# Patient Record
Sex: Male | Born: 1955
Health system: Southern US, Community
[De-identification: ages and names within clinical notes are randomized; demographics above are authoritative.]

## PROBLEM LIST (undated history)

## (undated) DIAGNOSIS — R05 Cough: Secondary | ICD-10-CM

## (undated) DIAGNOSIS — R55 Syncope and collapse: Secondary | ICD-10-CM

## (undated) DIAGNOSIS — K219 Gastro-esophageal reflux disease without esophagitis: Secondary | ICD-10-CM

## (undated) DIAGNOSIS — I10 Essential (primary) hypertension: Secondary | ICD-10-CM

## (undated) DIAGNOSIS — M797 Fibromyalgia: Secondary | ICD-10-CM

## (undated) DIAGNOSIS — E785 Hyperlipidemia, unspecified: Secondary | ICD-10-CM

## (undated) DIAGNOSIS — IMO0002 Reserved for concepts with insufficient information to code with codable children: Secondary | ICD-10-CM

## (undated) DIAGNOSIS — J189 Pneumonia, unspecified organism: Secondary | ICD-10-CM

## (undated) DIAGNOSIS — D649 Anemia, unspecified: Secondary | ICD-10-CM

## (undated) DIAGNOSIS — R054 Cough syncope: Secondary | ICD-10-CM

## (undated) DIAGNOSIS — R0602 Shortness of breath: Secondary | ICD-10-CM

## (undated) DIAGNOSIS — E538 Deficiency of other specified B group vitamins: Secondary | ICD-10-CM

## (undated) DIAGNOSIS — G4730 Sleep apnea, unspecified: Secondary | ICD-10-CM

## (undated) DIAGNOSIS — Z87442 Personal history of urinary calculi: Secondary | ICD-10-CM

## (undated) DIAGNOSIS — E118 Type 2 diabetes mellitus with unspecified complications: Secondary | ICD-10-CM

## (undated) DIAGNOSIS — E1165 Type 2 diabetes mellitus with hyperglycemia: Secondary | ICD-10-CM

## (undated) DIAGNOSIS — E119 Type 2 diabetes mellitus without complications: Secondary | ICD-10-CM

## (undated) DIAGNOSIS — I2510 Atherosclerotic heart disease of native coronary artery without angina pectoris: Secondary | ICD-10-CM

## (undated) HISTORY — DX: Type 2 diabetes mellitus with hyperglycemia: E11.65

## (undated) HISTORY — PX: SHOULDER ARTHROSCOPY: SHX128

## (undated) HISTORY — DX: Essential (primary) hypertension: I10

## (undated) HISTORY — PX: ROUX-EN-Y GASTRIC BYPASS: SHX1104

## (undated) HISTORY — DX: Shortness of breath: R06.02

## (undated) HISTORY — DX: Type 2 diabetes mellitus with unspecified complications: E11.8

## (undated) HISTORY — DX: Hyperlipidemia, unspecified: E78.5

## (undated) HISTORY — PX: HERNIA REPAIR: SHX51

## (undated) HISTORY — DX: Deficiency of other specified B group vitamins: E53.8

## (undated) HISTORY — PX: ELBOW SURGERY: SHX618

## (undated) HISTORY — DX: Atherosclerotic heart disease of native coronary artery without angina pectoris: I25.10

## (undated) HISTORY — DX: Reserved for concepts with insufficient information to code with codable children: IMO0002

## (undated) HISTORY — DX: Morbid (severe) obesity due to excess calories: E66.01

---

## 2002-09-25 ENCOUNTER — Encounter: Payer: Self-pay | Admitting: Urology

## 2002-09-25 ENCOUNTER — Encounter: Payer: Self-pay | Admitting: Emergency Medicine

## 2002-09-25 ENCOUNTER — Emergency Department (HOSPITAL_COMMUNITY): Admission: EM | Admit: 2002-09-25 | Discharge: 2002-09-25 | Payer: Self-pay | Admitting: Emergency Medicine

## 2002-09-25 ENCOUNTER — Ambulatory Visit (HOSPITAL_COMMUNITY): Admission: RE | Admit: 2002-09-25 | Discharge: 2002-09-25 | Payer: Self-pay | Admitting: Urology

## 2003-12-03 ENCOUNTER — Encounter: Admission: RE | Admit: 2003-12-03 | Discharge: 2003-12-03 | Payer: Self-pay | Admitting: Family Medicine

## 2005-01-30 ENCOUNTER — Encounter: Admission: RE | Admit: 2005-01-30 | Discharge: 2005-01-30 | Payer: Self-pay | Admitting: Family Medicine

## 2005-03-27 ENCOUNTER — Ambulatory Visit (HOSPITAL_COMMUNITY): Admission: RE | Admit: 2005-03-27 | Discharge: 2005-03-27 | Payer: Self-pay | Admitting: Cardiology

## 2005-08-04 ENCOUNTER — Encounter: Admission: RE | Admit: 2005-08-04 | Discharge: 2005-08-04 | Payer: Self-pay | Admitting: Surgery

## 2005-08-05 ENCOUNTER — Ambulatory Visit (HOSPITAL_BASED_OUTPATIENT_CLINIC_OR_DEPARTMENT_OTHER): Admission: RE | Admit: 2005-08-05 | Discharge: 2005-08-05 | Payer: Self-pay | Admitting: Surgery

## 2005-09-22 ENCOUNTER — Encounter: Admission: RE | Admit: 2005-09-22 | Discharge: 2005-09-22 | Payer: Self-pay | Admitting: Family Medicine

## 2005-09-30 ENCOUNTER — Ambulatory Visit: Payer: Self-pay | Admitting: Pulmonary Disease

## 2005-10-15 ENCOUNTER — Encounter: Admission: RE | Admit: 2005-10-15 | Discharge: 2005-10-15 | Payer: Self-pay | Admitting: Family Medicine

## 2005-10-30 ENCOUNTER — Encounter: Admission: RE | Admit: 2005-10-30 | Discharge: 2005-10-30 | Payer: Self-pay | Admitting: *Deleted

## 2005-10-31 ENCOUNTER — Encounter: Admission: RE | Admit: 2005-10-31 | Discharge: 2005-10-31 | Payer: Self-pay | Admitting: *Deleted

## 2005-11-05 ENCOUNTER — Encounter: Admission: RE | Admit: 2005-11-05 | Discharge: 2005-11-05 | Payer: Self-pay | Admitting: Gastroenterology

## 2006-11-03 ENCOUNTER — Ambulatory Visit: Payer: Self-pay | Admitting: Gastroenterology

## 2006-11-10 ENCOUNTER — Ambulatory Visit: Payer: Self-pay | Admitting: Gastroenterology

## 2007-11-30 ENCOUNTER — Ambulatory Visit (HOSPITAL_COMMUNITY): Admission: RE | Admit: 2007-11-30 | Discharge: 2007-11-30 | Payer: Self-pay

## 2007-11-30 ENCOUNTER — Ambulatory Visit: Payer: Self-pay | Admitting: Surgery

## 2009-07-31 DIAGNOSIS — G3184 Mild cognitive impairment, so stated: Secondary | ICD-10-CM | POA: Insufficient documentation

## 2009-08-07 DIAGNOSIS — R5381 Other malaise: Secondary | ICD-10-CM

## 2009-08-07 DIAGNOSIS — R61 Generalized hyperhidrosis: Secondary | ICD-10-CM

## 2009-08-07 DIAGNOSIS — Z87442 Personal history of urinary calculi: Secondary | ICD-10-CM

## 2009-08-07 DIAGNOSIS — E782 Mixed hyperlipidemia: Secondary | ICD-10-CM | POA: Insufficient documentation

## 2009-08-07 DIAGNOSIS — I10 Essential (primary) hypertension: Secondary | ICD-10-CM | POA: Insufficient documentation

## 2009-08-07 DIAGNOSIS — E785 Hyperlipidemia, unspecified: Secondary | ICD-10-CM

## 2009-08-07 DIAGNOSIS — Z87898 Personal history of other specified conditions: Secondary | ICD-10-CM

## 2009-08-07 DIAGNOSIS — K5730 Diverticulosis of large intestine without perforation or abscess without bleeding: Secondary | ICD-10-CM | POA: Insufficient documentation

## 2009-08-07 DIAGNOSIS — G4733 Obstructive sleep apnea (adult) (pediatric): Secondary | ICD-10-CM

## 2009-08-07 DIAGNOSIS — R5383 Other fatigue: Secondary | ICD-10-CM

## 2009-08-12 ENCOUNTER — Ambulatory Visit: Payer: Self-pay | Admitting: Cardiovascular Disease

## 2009-08-12 DIAGNOSIS — R079 Chest pain, unspecified: Secondary | ICD-10-CM

## 2009-08-12 DIAGNOSIS — R42 Dizziness and giddiness: Secondary | ICD-10-CM | POA: Insufficient documentation

## 2009-08-12 DIAGNOSIS — I2510 Atherosclerotic heart disease of native coronary artery without angina pectoris: Secondary | ICD-10-CM | POA: Insufficient documentation

## 2009-08-16 ENCOUNTER — Telehealth: Payer: Self-pay | Admitting: Cardiovascular Disease

## 2009-08-20 ENCOUNTER — Telehealth (INDEPENDENT_AMBULATORY_CARE_PROVIDER_SITE_OTHER): Payer: Self-pay | Admitting: *Deleted

## 2009-08-21 ENCOUNTER — Ambulatory Visit (HOSPITAL_COMMUNITY): Admission: RE | Admit: 2009-08-21 | Discharge: 2009-08-21 | Payer: Self-pay | Admitting: Cardiovascular Disease

## 2009-08-21 ENCOUNTER — Ambulatory Visit: Payer: Self-pay

## 2009-08-21 ENCOUNTER — Encounter (HOSPITAL_COMMUNITY): Admission: RE | Admit: 2009-08-21 | Discharge: 2009-10-22 | Payer: Self-pay | Admitting: Cardiovascular Disease

## 2009-08-21 ENCOUNTER — Ambulatory Visit: Payer: Self-pay | Admitting: Cardiovascular Disease

## 2009-08-21 ENCOUNTER — Encounter: Payer: Self-pay | Admitting: Cardiovascular Disease

## 2009-08-29 ENCOUNTER — Encounter: Payer: Self-pay | Admitting: Cardiology

## 2009-09-02 ENCOUNTER — Ambulatory Visit: Payer: Self-pay | Admitting: Cardiovascular Disease

## 2009-09-04 ENCOUNTER — Ambulatory Visit: Payer: Self-pay | Admitting: Cardiovascular Disease

## 2009-09-04 ENCOUNTER — Inpatient Hospital Stay (HOSPITAL_BASED_OUTPATIENT_CLINIC_OR_DEPARTMENT_OTHER): Admission: RE | Admit: 2009-09-04 | Discharge: 2009-09-04 | Payer: Self-pay | Admitting: Cardiovascular Disease

## 2009-09-19 ENCOUNTER — Telehealth: Payer: Self-pay | Admitting: Cardiovascular Disease

## 2009-09-23 ENCOUNTER — Telehealth (INDEPENDENT_AMBULATORY_CARE_PROVIDER_SITE_OTHER): Payer: Self-pay | Admitting: *Deleted

## 2009-10-01 ENCOUNTER — Ambulatory Visit: Payer: Self-pay | Admitting: Cardiovascular Disease

## 2009-11-20 DIAGNOSIS — M064 Inflammatory polyarthropathy: Secondary | ICD-10-CM | POA: Insufficient documentation

## 2010-03-02 ENCOUNTER — Encounter: Admission: RE | Admit: 2010-03-02 | Discharge: 2010-03-02 | Payer: Self-pay | Admitting: *Deleted

## 2010-07-13 ENCOUNTER — Encounter: Payer: Self-pay | Admitting: *Deleted

## 2010-07-20 LAB — CONVERTED CEMR LAB
Basophils Absolute: 0 10*3/uL (ref 0.0–0.1)
Basophils Relative: 0.5 % (ref 0.0–3.0)
Calcium: 9.1 mg/dL (ref 8.4–10.5)
Chloride: 111 meq/L (ref 96–112)
Creatinine, Ser: 0.9 mg/dL (ref 0.4–1.5)
GFR calc non Af Amer: 92.98 mL/min (ref >60)
Glucose, Bld: 104 mg/dL — ABNORMAL HIGH (ref 70–99)
Hemoglobin: 16.3 g/dL (ref 13.0–17.0)
INR: 1.1 — ABNORMAL HIGH (ref 0.8–1.0)
Lymphocytes Relative: 22.8 % (ref 12.0–46.0)
Lymphs Abs: 2 10*3/uL (ref 0.7–4.0)
MCHC: 33.6 g/dL (ref 30.0–36.0)
MCV: 86.6 fL (ref 78.0–100.0)
Neutro Abs: 5.6 10*3/uL (ref 1.4–7.7)
Neutrophils Relative %: 63.6 % (ref 43.0–77.0)
RBC: 5.61 M/uL (ref 4.22–5.81)
Sodium: 143 meq/L (ref 135–145)
aPTT: 29.3 s — ABNORMAL HIGH (ref 21.7–28.8)

## 2010-07-22 NOTE — Progress Notes (Signed)
  Phone Note Other Incoming   Request: Send information Summary of Call: Request received from [employer] forwarded to [employer].       

## 2010-07-22 NOTE — Progress Notes (Signed)
Summary: holtor monitor  Phone Note Outgoing Call   Summary of Call: Called pt to give him results of holtor monitor (see 09/19/09 append to 09/02/09 office note). Left message to call back Initial call taken by: Dossie Arbour, RN, BSN,  September 19, 2009 5:56 PM  Follow-up for Phone Call        Dr. Clifton James reviewed with pt at office visit 10/01/09 Follow-up by: Dossie Arbour, RN, BSN,  October 03, 2009 8:40 AM

## 2010-07-22 NOTE — Assessment & Plan Note (Signed)
Summary: per check out/sf   Visit Type:  Follow-up Primary Provider:  Herb Grays, MD  CC:  sob due to bronchitis/pneumonia.  History of Present Illness: 55 yo WM with history moderate non-obstrucitve CAD, HTN, hyperlipidemia, DM, GERD, anxiety, depression here today for follow up. He has been having "spells" for one year. These spells occur when he is sitting, standing or walking. This is described as being exhausted. There is occasionally associated chest pressure and arm pain.  When he has these spells, he checks his HR and blood pressure and blood sugar all of which have been within normal limits. He had one syncopal episode in December 2010 related to coughing.  I ordered a stress test, echo, carotid dopplers and a 21 day event monitor. His carotid dopplers were normal. His event monitor showed no arrythmias. Stress test was without ischemia but given his symptom complex and history of CAD, I elected to perfrm a diagnostic left heart cath. The left heart cath showed moderate disease as outline below but no severe lesions.   He is here today for follow up. He tells me that he has bronchitis and pneumonia for one week. He has been seen by Dr. Herb Grays and has been on antibiotics. His breathing has improved but he is still coughing. No fevers or chills. He has been taking all of his cardiac medications.   He continues to have episodes of profound weakness with sweating. No chest pain. He had a thyroid test in [facility] but he has not heard the results. He has had no weight loss lately and noticed no swollen lymph nodes.   Current Medications (verified): 1)  Amlodipine Besylate 10 Mg Tabs (Amlodipine Besylate) .Marland Kitchen.. 1 Tab Once Daily 2)  Crestor 20 Mg Tabs (Rosuvastatin Calcium) .Marland Kitchen.. 1 Tab Once Daily 3)  Lisinopril 10 Mg Tabs (Lisinopril) .... 1/2 Tab Once Daily 4)  Protonix 40 Mg Tbec (Pantoprazole Sodium) .Marland Kitchen.. 1 Tab Two Times A Day 5)  Metformin Hcl 500 Mg Tabs (Metformin Hcl)  .... 2 Tab Two Times A Day 6)  Metoprolol Tartrate 100 Mg Tabs (Metoprolol Tartrate) .Marland Kitchen.. 1 Tab Once Daily 7)  Sertraline Hcl 100 Mg Tabs (Sertraline Hcl) .Marland Kitchen.. 1 Tab Once Daily 8)  Requip 1 Mg Tabs (Ropinirole Hcl) .Marland Kitchen.. 1 Tab Once Daily 9)  Glimepiride 1 Mg Tabs (Glimepiride) .Marland Kitchen.. 1 Tab Once Daily 10)  Aspirin Ec 325 Mg Tbec (Aspirin) .... Take One Tablet By Mouth Daily 11)  Diphenhydramine Hcl 25 Mg Caps (Diphenhydramine Hcl) .Marland Kitchen.. 1 Tab At Bedtime 12)  Advil 200 Mg Tabs (Ibuprofen) .Marland Kitchen.. 1 Tab As Needed 13)  Doxycycline Hyclate 100 Mg Caps (Doxycycline Hyclate) .Marland Kitchen.. 1 Tab By Mouth Two Times A Day 14)  Mucinex Dm Maximum Strength 60-1200 Mg Xr12h-Tab (Dextromethorphan-Guaifenesin) .... As Needed  Allergies: 1)  ! Pcn  Past History:  Past Medical History: HYPERTENSION (ICD-401.9) HYPERLIPIDEMIA (ICD-272.4) DM Non-obstrucitve CAD by cath 2011, 50% mid LAD. HEADACHES, HX OF (ICD-V13.8) Nephrolithiasis DIVERTICULOSIS, COLON (ICD-562.10) GERD Anxiety  Depression    Social History: Reviewed history from 08/12/2009 and no changes required. Former tobacco, 1ppd for 10 years, quit 1980. Chews 1-2 packs of tobacco per day No alcohol No recreational drug use Married, 2 children Former Producer, television/film/video  Review of Systems       The patient complains of fatigue, shortness of breath, and prolonged cough.  The patient denies malaise, fever, weight gain/loss, vision loss, decreased hearing, hoarseness, chest pain, palpitations, wheezing, sleep apnea, coughing up blood,  abdominal pain, blood in stool, nausea, vomiting, diarrhea, heartburn, incontinence, blood in urine, muscle weakness, joint pain, leg swelling, rash, skin lesions, headache, fainting, dizziness, depression, anxiety, enlarged lymph nodes, easy bruising or bleeding, and environmental allergies.    Vital Signs:  Patient profile:   55 year old male Height:      70 inches Weight:      263 pounds BMI:     37.87 O2 Sat:       96 % Pulse rate:   72 / minute Resp:     16 per minute BP sitting:   140 / 98  (left arm)  Vitals Entered By: Kem Parkinson (October 01, 2009 4:26 PM)  Physical Exam  General:  General: Well developed, well nourished, NAD HEENT: OP clear, mucus membranes moist SKIN: moist, warm Neuro: No focal deficits Musculoskeletal: Muscle strength 5/5 all ext Psychiatric: Mood and affect normal Neck: No JVD, no carotid bruits, no thyromegaly, no lymphadenopathy. Lungs:Clear bilaterally, no wheezes, rhonci, crackles CV: RRR no murmurs, gallops rubs Abdomen: soft, NT, ND, BS present Extremities: No edema, pulses 2+.    Cardiac Cath  Procedure date:  09/04/2009  Findings:      HEMODYNAMIC FINDINGS:  Central aortic pressure 105/66.  Left ventricular pressure 107/15.  Left ventricular end-diastolic pressure 17.   ANGIOGRAPHIC FINDINGS: 1. The left main coronary artery had no evidence of disease. 2. Left anterior descending is a large vessel that coursed to the apex     and gives off 2 diagonal branches.  The first diagonal branch is     moderate size with mild plaque disease.  Second diagonal is small     with no disease.  The proximal LAD has several areas of mild 20%     stenosis.  Just beyond the takeoff of the first diagonal branch,     there appears to be a 405 to 50% tubular stenosis in the mid LAD.     This does not appear to be flow-limiting.  There is diffuse 20%     stenosis in the distal vessel. 3. The circumflex artery is a small-caliber vessel with no disease. 4. The ramus intermediate is a moderate-to-large sized vessel with 30%     proximal stenosis and 30% mid plaque. 5. The right coronary artery is a large dominant vessel with a long     tubular 40% stenosis in the proximal portion.  There are luminal     irregularities only in the mid and distal vessel.  There are no     flow-limiting lesions in this vessel. 6. The left ventricular angiogram was performed in the  RAO projection.     Left ventricular systolic function was normal with no wall motion     abnormality noted.  Ejection fraction was 60%.   IMPRESSION: 1. Moderate nonobstructive coronary artery disease. 2. Normal left ventricular systolic function.  Impression & Recommendations:  Problem # 1:  CAD, NATIVE VESSEL (ICD-414.01) Stable by cath 09/04/09. Continue ASA, beta blocker, statin, Ace-inhibitor. I do not think his episodes of fatigue and weakness are cardiac related. I am concerned that this may be due to his thyroid or other endocrine abnormality. He will call the neurology office to get his TSH results and let us know.   His updated medication list for this problem includes:    Amlodipine Besylate 10 Mg Tabs (Amlodipine besylate) .Marland Kitchen... 1 tab once daily    Lisinopril 10 Mg Tabs (Lisinopril) .Marland Kitchen... 1/2 tab once daily  Metoprolol Tartrate 100 Mg Tabs (Metoprolol tartrate) .Marland Kitchen... 1 tab once daily    Aspirin Ec 325 Mg Tbec (Aspirin) .Marland Kitchen... Take one tablet by mouth daily  Problem # 2:  WEAKNESS (ICD-780.79) See above.   Problem # 3:  HYPERTENSION (ICD-401.9) Slightly elevated today. No changes in therapy.   His updated medication list for this problem includes:    Amlodipine Besylate 10 Mg Tabs (Amlodipine besylate) .Marland Kitchen... 1 tab once daily    Lisinopril 10 Mg Tabs (Lisinopril) .Marland Kitchen... 1/2 tab once daily    Metoprolol Tartrate 100 Mg Tabs (Metoprolol tartrate) .Marland Kitchen... 1 tab once daily    Aspirin Ec 325 Mg Tbec (Aspirin) .Marland Kitchen... Take one tablet by mouth daily  Problem # 4:  HYPERLIPIDEMIA (ICD-272.4) Continue statin. Fasting lipids followed in primary care.  Goal LDL <70.   His updated medication list for this problem includes:    Crestor 20 Mg Tabs (Rosuvastatin calcium) .Marland Kitchen... 1 tab once daily  Patient Instructions: 1)  Your physician recommends that you schedule a follow-up appointment in: 6 months 2)  Your physician recommends that you continue on your current medications as directed.  Please refer to the Current Medication list given to you today.

## 2010-07-22 NOTE — Letter (Signed)
Summary: Cardiac Catheterization Instructions- [facility]  [facility], Main Office  [address]   [place], [place] [address]   Phone: [phone]  Fax: [phone]     09/02/2009 MRN: [id number]  ESAIAS CLEAVENGER [address] [place], [place]  [address]  Dear Mr. Throckmorton,   You are scheduled for a Cardiac Catheterization on March 16. 2011_ with Dr.McAlhany_  Please arrive to the 1st floor of the Heart and Vascular Center at [facility] at 10:30 am  on the day of your procedure. Please do not arrive before 6:30 a.m. Call the Heart and Vascular Center at [phone] if you are unable to make your appointmnet. The Code to get into the parking garage under the building is 9000_. Take the elevators to the 1st floor. You must have someone to drive you home. Someone must be with you for the first 24 hours after you arrive home. Please wear clothes that are easy to get on and off and wear slip-on shoes. Do not eat or drink after midnight except water with your medications that morning. Bring all your medications and current insurance cards with you.  _X__ DO NOT take these medications before your procedure: Do not take metformin evening before and morning of procedure and for 48 hours after procedure. Do not take glimepiride the morning of procedure. ________________________________________________________________  _X__ Make sure you take your aspirin.  ___ You may take ALL of your medications with water that morning. ________________________________________________________________________________________________________________________________  ___ DO NOT take ANY medications before your procedure.  ___ Pre-med instructions:  ________________________________________________________________________________________________________________________________  The usual length of stay after your procedure is 2 to 3 hours. This can vary.  If you have any questions, please  call the office at the number listed above.   Dossie Arbour, RN, BSN

## 2010-07-22 NOTE — Progress Notes (Signed)
Summary: Nuclear Pre-Procedure  Phone Note Outgoing Call   Call placed by: Milana Na, EMT-P,  August 20, 2009 3:42 PM Summary of Call: Left message with information on Myoview Information Sheet (see scanned document for details).      Nuclear Med Background Indications for Stress Test: Evaluation for Ischemia   History: Heart Catheterization  History Comments: '06 Heart Cath N/O CAD LAD 50%  Symptoms: Chest Pressure, Dizziness, Fatigue, Fatigue with Exertion, Syncope    Nuclear Pre-Procedure Cardiac Risk Factors: History of Smoking, Hypertension, Lipids Height (in): 70  Nuclear Med Study Referring MD:  C.McAlhany

## 2010-07-22 NOTE — Progress Notes (Signed)
Summary: benefit not covered  Phone Note From Other Clinic Call back at [phone]   Caller: Jeanice Lim with [facility] Request: Talk with Nurse, Talk with Provider Summary of Call: code [phone] this was reviewed and it's not a covered benefit Initial call taken by: Omer Jack,  August 16, 2009 10:20 AM  Follow-up for Phone Call        Unable to leave a message mail box full. Ollen Gross, RN, BSN  August 16, 2009 10:35 AM Called and was on hold for 7 minutes. Attempted to leave voice mail but mailbox was full.  Will try again later. Dossie Arbour, RN, BSN  August 19, 2009 10:51 AM Called and was on hold for 5 minutes.  Mailbox still full. Will continue to try and reach. Dossie Arbour, RN, BSN  August 19, 2009 4:21 PM Discussed with Elita Quick (in billing) and Windell Moulding. Windell Moulding will follow up and explain coverage to pt when he is here to have monitor placed on 08/21/09 Follow-up by: Dossie Arbour, RN, BSN,  August 20, 2009 8:42 AM

## 2010-07-22 NOTE — Assessment & Plan Note (Signed)
Summary: f/u echo , carotid, myoview 08-21-09   Visit Type:  Follow-up Primary Provider:  Dewain Penning  CC:  sob...cp x2 in last 2 wks..denies any edema.  History of Present Illness: 55 yo Williams with history non-obstrucitve CAD by cath 2006, HTN, hyperlipidemia, DM, GERD, anxiety, depression here today for follow up. He has been having "spells" for one year. I saw him as a new patient several weeks ago. These spells occur when he is sitting, standing or walking. This is described as being exhausted. No associated but there is occasionally associated chest pressure and arm pain. This occurs several times per week. There is no preceding symptom to warn him. No associated palpitations. He has good days where he can accomplish any task. He cut wood last weekend without problems.  When he has these spells, he checks his HR and blood pressure and blood sugar all of which have been within normal limits. He had one sycnopal episode in December 2010 related to coughing. He has recently seen a neurologist who did not feel like this was a neurological event. I ordered a stress test, echo, carotid dopplers and a 21 day event monitor.   He tells me that he has had several episodes of chest pain over the last few days. This is left sided with radiation into his left arm and associated nausea with diaphoresis, weakness. He has felt "awful"  during these episodes. His energy level has been severely reduced since I saw him a few weeks ago. He was told that he had a 50-60% stenosis by cath in 2006 in the LAD.   Current Medications (verified): 1)  Amlodipine Besylate 10 Mg Tabs (Amlodipine Besylate) .Marland Kitchen.. 1 Tab Once Daily 2)  Crestor 20 Mg Tabs (Rosuvastatin Calcium) .Marland Kitchen.. 1 Tab Once Daily 3)  Lisinopril 10 Mg Tabs (Lisinopril) .... 1/2 Tab Once Daily 4)  Protonix 40 Mg Tbec (Pantoprazole Sodium) .Marland Kitchen.. 1 Tab Two Times A Day 5)  Metformin Hcl 500 Mg Tabs (Metformin Hcl) .... 2 Tab Two Times A Day 6)  Metoprolol Tartrate 100  Mg Tabs (Metoprolol Tartrate) .Marland Kitchen.. 1 Tab Once Daily 7)  Sertraline Hcl 100 Mg Tabs (Sertraline Hcl) .Marland Kitchen.. 1 Tab Once Daily 8)  Requip 1 Mg Tabs (Ropinirole Hcl) .Marland Kitchen.. 1 Tab Once Daily 9)  Glimepiride 1 Mg Tabs (Glimepiride) .Marland Kitchen.. 1 Tab Once Daily 10)  Aspirin Ec 325 Mg Tbec (Aspirin) .... Take One Tablet By Mouth Daily 11)  Diphenhydramine Hcl 25 Mg Caps (Diphenhydramine Hcl) .Marland Kitchen.. 1 Tab At Bedtime 12)  Advil 200 Mg Tabs (Ibuprofen) .Marland Kitchen.. 1 Tab As Needed  Allergies: 1)  ! Pcn  Past History:  Past Medical History: Reviewed history from 08/12/2009 and no changes required. HYPERTENSION (ICD-401.9) HYPERLIPIDEMIA (ICD-272.4) DM Non-obstrucitve CAD by cath 2006, 40-50% mid LAD. HEADACHES, HX OF (ICD-V13.8) Nephrolithiasis DIVERTICULOSIS, COLON (ICD-562.10) GERD Anxiety  Depression    Past Surgical History: Reviewed history from 08/12/2009 and no changes required. Status post hernia repair in 2007 left, right hernia 1991 Status post kidney stone extraction in 2004.  Status post right elbow and right shoulder surgery in 1998.      Family History: Reviewed history from 08/12/2009 and no changes required. Mother alive at age 23, healthy Father deceased at age 36, lung cancer Sister alive  Social History: Reviewed history from 08/12/2009 and no changes required. Former tobacco, 1ppd for 10 years, quit 1980. 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  The patient denies fever, weight gain/loss, vision loss, decreased hearing, hoarseness, palpitations, prolonged cough, wheezing, sleep apnea, coughing up blood, abdominal pain, blood in stool, nausea, vomiting, diarrhea, heartburn, incontinence, blood in urine, muscle weakness, joint pain, leg swelling, rash, skin lesions, headache, fainting,  depression, anxiety, enlarged lymph nodes, easy bruising or bleeding, and environmental allergies.    Vital Signs:  Patient profile:   55 year old male Height:      70 inches Weight:      263 pounds BMI:     37.87 Pulse rate:   56 / minute Pulse rhythm:   irregular BP sitting:   128 / 90  (left arm) Cuff size:   large  Vitals Entered By: Danielle Rankin, CMA (September 02, 2009 11:36 AM)  Physical Exam  General:  General: Well developed, well nourished, NAD HEENT: OP clear, mucus membranes moist SKIN: warm, dry Neuro: No focal deficits Musculoskeletal: Muscle strength 5/5 all ext Psychiatric: Mood and affect normal Neck: No JVD, no carotid bruits, no thyromegaly, no lymphadenopathy. Lungs:Clear bilaterally, no wheezes, rhonci, crackles CV: RRR no murmurs, gallops rubs Abdomen: soft, NT, ND, BS present Extremities: No edema, pulses 2+.    EKG  Procedure date:  09/02/2009  Findings:      Sinus bradycardia, rate 56 bpm. Poor R wave progression through precordial leads.   Nuclear Study  Procedure date:  08/21/2009  Findings:      Rest Procedure   Myocardial perfusion imaging was performed at rest 45 minutes following the intravenous administration of Myoview Technetium 29m Tetrofosmin.  Stress Procedure   The patient received IV Lexiscan 0.4 mg over 15-seconds.  Myoview injected at 30-seconds.  There were no significant changes with infusion.  Quantitative spect images were obtained after a 45 minute delay.  QPS  Raw Data Images:  Normal; no motion artifact; normal heart/lung ratio. Stress Images:  NI: Uniform and normal uptake of tracer in all myocardial segments. Rest Images:  Normal homogeneous uptake in all areas of the myocardium. Subtraction (SDS):  Normal Transient Ischemic Dilatation:  .91  (Normal <1.22)  Lung/Heart Ratio:  .30  (Normal <0.45)  Quantitative Gated Spect Images  QGS EDV:  128 ml QGS ESV:  Grant ml QGS EF:  62 % QGS cine images:  normal  Findings   Normal nuclear study   Overall Impression   Exercise Capacity: Lexiscan BP Response: Normal blood pressure response. Clinical Symptoms: Dyspnea ECG Impression: No significant ST segment change suggestive of ischemia. Overall Impression: Normal stress nuclear study. Overall Impression Comments: Normal  Echocardiogram  Procedure date:  08/21/2009  Findings:      Left ventricle: Unable to assess RWMAs due to poor windows. Possible     septal hypokinesis. Overall EF probably in the normal range The     cavity size was normal. Wall thickness was increased in a pattern of     moderate LVH. Systolic function was normal. Wall motion was normal;     there were no regional wall motion abnormalities.  Carotid Doppler  Procedure date:  08/21/2009  Findings:      No obstructive disease.   Impression & Recommendations:  Problem # 1:  CAD, NATIVE VESSEL (ICD-414.01) Symptoms worrisome for unstable angina. His nuclear stress showed no big defects but with his symptom complex, will proceed to diagnostic left heart cath with possible PCI.  I cannot otherwise explain his fatigue and chest pain. He has not yet completed the event monitor. Will arrange cath for Wednesday March 16th at [facility]. Check BMET, CBC and coags today.   His updated medication list for this problem includes:    Amlodipine Besylate 10 Mg Tabs (Amlodipine besylate) .Marland Kitchen... 1 tab once daily    Lisinopril 10 Mg Tabs (Lisinopril) .Marland Kitchen... 1/2 tab once daily    Metoprolol Tartrate 100 Mg Tabs (Metoprolol tartrate) .Marland Kitchen... 1 tab once daily    Aspirin Ec 325 Mg Tbec (Aspirin) .Marland Kitchen... Take one tablet by mouth daily  Orders: Cardiac Catheterization (Cardiac Cath) TLB-BMP (Basic Metabolic Panel-BMET) (80048-METABOL) TLB-CBC Platelet - w/Differential (85025-CBCD) TLB-PT (Protime) (85610-PTP) TLB-PTT (85730-PTTL)  Patient Instructions: 1)  Your physician recommends that you schedule a follow-up appointment in: 1 month 2)  Your  physician has requested that you have a cardiac catheterization.  Cardiac catheterization is used to diagnose and/or treat various heart conditions. Doctors may recommend this procedure for a number of different reasons. The most common reason is to evaluate chest pain. Chest pain can be a symptom of coronary artery disease (CAD), and cardiac catheterization can show whether plaque is narrowing or blocking your heart's arteries. This procedure is also used to evaluate the valves, as well as measure the blood flow and oxygen levels in different parts of your heart.  For further information please visit [email or web address]  Please follow instruction sheet, as given.  Appended Document: f/u echo , carotid, myoview 08-21-09 21 day event monitor reviewed. Sinus rhythm with no evidence of VT, SVT or atrial fibrillation. cdm

## 2010-07-22 NOTE — Assessment & Plan Note (Signed)
Summary: np6/weakness sweating.memory  Medications Added AMLODIPINE BESYLATE 10 MG TABS (AMLODIPINE BESYLATE) 1 tab once daily CRESTOR 20 MG TABS (ROSUVASTATIN CALCIUM) 1 tab once daily LISINOPRIL 10 MG TABS (LISINOPRIL) 1/2 tab once daily PROTONIX 40 MG TBEC (PANTOPRAZOLE SODIUM) 1 tab two times a day METFORMIN HCL 500 MG TABS (METFORMIN HCL) 2 tab two times a day METOPROLOL TARTRATE 100 MG TABS (METOPROLOL TARTRATE) 1 tab once daily SERTRALINE HCL 100 MG TABS (SERTRALINE HCL) 1 tab once daily REQUIP 1 MG TABS (ROPINIROLE HCL) 1 tab once daily GLIMEPIRIDE 1 MG TABS (GLIMEPIRIDE) 1 tab once daily ASPIRIN EC 325 MG TBEC (ASPIRIN) Take one tablet by mouth daily DIPHENHYDRAMINE HCL 25 MG CAPS (DIPHENHYDRAMINE HCL) 1 tab at bedtime ADVIL 200 MG TABS (IBUPROFEN) 1 tab as needed      Allergies Added: ! PCN  Visit Type:  new pt visit Primary Provider:  Dewain Penning  CC:  weakness, diaphoresis, fatigue, chest pressure, and brain fog.  History of Present Illness: 55 yo WM with history non-obstrucitve CAD by cath 2006, HTN, hyperlipidemia, DM, GERD, anxiety, depression here today to establish cardiology care. He has been having "spells" for one year. These spells occur when he is sitting, standing or walking. This is described as being exhausted. No associated but there is occasionally associated chest pressure and arm pain. This occurs several times per week. There is no preceding symptom to warn him. No associated palpitations. He has good days where he can accomplish any task. He cut wood last weekend without problems. He has no exertional dyspnea or chest pain. When he has these spells, he checks his HR and blood pressure and blood sugar all of which have been within normal limits. He had one sycnopal episode in December 2010 related to coughing. He has recently seen a neurologist who did not feel like this was a neurological event. TSH was checked there but he has not heard the results.   Old  records reviewed.  Current Medications (verified): 1)  Amlodipine Besylate 10 Mg Tabs (Amlodipine Besylate) .Marland Kitchen.. 1 Tab Once Daily 2)  Crestor 20 Mg Tabs (Rosuvastatin Calcium) .Marland Kitchen.. 1 Tab Once Daily 3)  Lisinopril 10 Mg Tabs (Lisinopril) .... 1/2 Tab Once Daily 4)  Protonix 40 Mg Tbec (Pantoprazole Sodium) .Marland Kitchen.. 1 Tab Two Times A Day 5)  Metformin Hcl 500 Mg Tabs (Metformin Hcl) .... 2 Tab Two Times A Day 6)  Metoprolol Tartrate 100 Mg Tabs (Metoprolol Tartrate) .Marland Kitchen.. 1 Tab Once Daily 7)  Sertraline Hcl 100 Mg Tabs (Sertraline Hcl) .Marland Kitchen.. 1 Tab Once Daily 8)  Requip 1 Mg Tabs (Ropinirole Hcl) .Marland Kitchen.. 1 Tab Once Daily 9)  Glimepiride 1 Mg Tabs (Glimepiride) .Marland Kitchen.. 1 Tab Once Daily 10)  Aspirin Ec 325 Mg Tbec (Aspirin) .... Take One Tablet By Mouth Daily 11)  Diphenhydramine Hcl 25 Mg Caps (Diphenhydramine Hcl) .Marland Kitchen.. 1 Tab At Bedtime 12)  Advil 200 Mg Tabs (Ibuprofen) .Marland Kitchen.. 1 Tab As Needed  Allergies (verified): 1)  ! Pcn  Past History:  Past Medical History: HYPERTENSION (ICD-401.9) HYPERLIPIDEMIA (ICD-272.4) DM Non-obstrucitve CAD by cath 2006, 40-50% mid LAD. HEADACHES, HX OF (ICD-V13.8) Nephrolithiasis DIVERTICULOSIS, COLON (ICD-562.10) GERD Anxiety  Depression    Past Surgical History: Status post hernia repair in 2007 left, right hernia 1991 Status post kidney stone extraction in 2004.  Status post right elbow and right shoulder surgery in 1998.      Family History: Mother alive at age 57, healthy Father deceased at age 4, lung cancer Sister  alive  Social History: Former tobacco, 1ppd for 10 years, quit 1980. Chews 1-2 packs of tobacco per day No alcohol No recreational drug use Married, 2 children Former Producer, television/film/video  Review of Systems       The patient complains of fatigue, malaise, chest pain, sleep apnea, fainting, dizziness, and anxiety.  The patient denies fever, weight gain/loss, vision loss, decreased hearing, hoarseness, palpitations, prolonged  cough, wheezing, coughing up blood, abdominal pain, blood in stool, nausea, vomiting, diarrhea, heartburn, incontinence, blood in urine, muscle weakness, joint pain, leg swelling, rash, skin lesions, headache, depression, enlarged lymph nodes, easy bruising or bleeding, and environmental allergies.    Vital Signs:  Patient profile:   55 year old male Height:      70 inches Weight:      262 pounds BMI:     37.73 Pulse rate:   56 / minute Pulse rhythm:   irregular BP sitting:   122 / 100  (left arm) Cuff size:   large  Vitals Entered By: Danielle Rankin, CMA (August 12, 2009 2:44 PM)  Physical Exam  General:  General: Well developed, well nourished, NAD HEENT: OP clear, mucus membranes moist SKIN: warm, dry Neuro: No focal deficits Musculoskeletal: Muscle strength 5/5 all ext Psychiatric: Mood and affect normal Neck: No JVD, no carotid bruits, no thyromegaly, no lymphadenopathy. Lungs:Clear bilaterally, no wheezes, rhonci, crackles CV: RRR no murmurs, gallops rubs Abdomen: soft, NT, ND, BS present Extremities: No edema, pulses 2+.    EKG  Procedure date:  08/12/2009  Findings:      Sinus bradycardia, rate 56bpm.   Impression & Recommendations:  Problem # 1:  CHEST PAIN-PRECORDIAL (ICD-786.51) His chest pain is left sided and has some typical features. He is known to have a 50% lesion in the mid LAD by cath 2006. It is difficult to say if his symptoms are related to his underlying CAD. Will order Lexiscan stress myoview to evaluate for ischemia and will check echo to assess LV size and function and exclude any significant valvular issues.   His updated medication list for this problem includes:    Amlodipine Besylate 10 Mg Tabs (Amlodipine besylate) .Marland Kitchen... 1 tab once daily    Lisinopril 10 Mg Tabs (Lisinopril) .Marland Kitchen... 1/2 tab once daily    Metoprolol Tartrate 100 Mg Tabs (Metoprolol tartrate) .Marland Kitchen... 1 tab once daily    Aspirin Ec 325 Mg Tbec (Aspirin) .Marland Kitchen... Take one tablet by  mouth daily  His updated medication list for this problem includes:    Amlodipine Besylate 10 Mg Tabs (Amlodipine besylate) .Marland Kitchen... 1 tab once daily    Lisinopril 10 Mg Tabs (Lisinopril) .Marland Kitchen... 1/2 tab once daily    Metoprolol Tartrate 100 Mg Tabs (Metoprolol tartrate) .Marland Kitchen... 1 tab once daily    Aspirin Ec 325 Mg Tbec (Aspirin) .Marland Kitchen... Take one tablet by mouth daily  Orders: EKG w/ Interpretation (93000) Event (Event) Nuclear Stress Test (Nuc Stress Test) Echocardiogram (Echo) Carotid Duplex (Carotid Duplex)  Problem # 2:  DIZZINESS (ICD-780.4) Difficult to isolate his symptoms. Will arrange carotid dopplers to exclude obstructive disease. Echo as above. Will arrange 21 day event monitor to exclude arrhythmia as cause of his dizziness and "spells".  TSH pending from the neurology office.   Orders: EKG w/ Interpretation (93000) Event (Event) Nuclear Stress Test (Nuc Stress Test) Echocardiogram (Echo) Carotid Duplex (Carotid Duplex)  Problem # 3:  HYPERTENSION (ICD-401.9) Well controlled on current therapy.  His updated medication list for this problem includes:  Amlodipine Besylate 10 Mg Tabs (Amlodipine besylate) .Marland Kitchen... 1 tab once daily    Lisinopril 10 Mg Tabs (Lisinopril) .Marland Kitchen... 1/2 tab once daily    Metoprolol Tartrate 100 Mg Tabs (Metoprolol tartrate) .Marland Kitchen... 1 tab once daily    Aspirin Ec 325 Mg Tbec (Aspirin) .Marland Kitchen... Take one tablet by mouth daily  Problem # 4:  CAD, NATIVE VESSEL (ICD-414.01) Mild to moderate disease by cath 2006. Stress testing as above.   His updated medication list for this problem includes:    Amlodipine Besylate 10 Mg Tabs (Amlodipine besylate) .Marland Kitchen... 1 tab once daily    Lisinopril 10 Mg Tabs (Lisinopril) .Marland Kitchen... 1/2 tab once daily    Metoprolol Tartrate 100 Mg Tabs (Metoprolol tartrate) .Marland Kitchen... 1 tab once daily    Aspirin Ec 325 Mg Tbec (Aspirin) .Marland Kitchen... Take one tablet by mouth daily  Patient Instructions: 1)  Your physician recommends that you schedule a  follow-up appointment in: 2 weeks 2)  Your physician has requested that you have a carotid duplex. This test is an ultrasound of the carotid arteries in your neck. It looks at blood flow through these arteries that supply the brain with blood. Allow one hour for this exam. There are no restrictions or special instructions. 3)  Your physician has requested that you have an echocardiogram.  Echocardiography is a painless test that uses sound waves to create images of your heart. It provides your doctor with information about the size and shape of your heart and how well your heart's chambers and valves are working.  This procedure takes approximately one hour. There are no restrictions for this procedure. 4)  Your physician has recommended that you wear an event monitor.  Event monitors are medical devices that record the heart's electrical activity. Doctors most often use these monitors to diagnose arrhythmias. Arrhythmias are problems with the speed or rhythm of the heartbeat. The monitor is a small, portable device. You can wear one while you do your normal daily activities. This is usually used to diagnose what is causing palpitations/syncope (passing out). 5)  Your physician has requested that you have an adenosine myoview.  For further information please visit [email or web address]  Please follow instruction sheet, as given.

## 2010-07-22 NOTE — Consult Note (Signed)
Summary: [facility] Referral [facility] Referral Packet   Imported By: Roderic Ovens 09/27/2009 15:44:47  _____________________________________________________________________  External Attachment:    Type:   Image     Comment:   External Document

## 2010-07-22 NOTE — Assessment & Plan Note (Signed)
Summary: Cardiology Nuclear Study  Nuclear Med Background Indications for Stress Test: Evaluation for Ischemia   History: Heart Catheterization  History Comments: '06 Heart Cath N/O CAD LAD 50%  Symptoms: Chest Pressure, Dizziness, Fatigue, Fatigue with Exertion, Syncope    Nuclear Pre-Procedure Cardiac Risk Factors: History of Smoking, Hypertension, Lipids Caffeine/Decaff Intake: None NPO After: 7:00 AM Lungs: clear IV 0.9% NS with Angio Cath: 22g     IV Site: (R) AC IV Started by: Irean Hong RN Chest Size (in) 48     Height (in): 70 Weight (lb): 257 BMI: 37.01  Nuclear Med Study 1 or 2 day study:  1 day     Stress Test Type:  Eugenie Birks Reading MD:  Charlton Haws, MD     Referring MD:  C.McAlhany Resting Radionuclide:  Technetium 54m Tetrofosmin     Resting Radionuclide Dose:  11.0 mCi  Stress Radionuclide:  Technetium 74m Tetrofosmin     Stress Radionuclide Dose:  32.0 mCi   Stress Protocol   Lexiscan: 0.4 mg   Stress Test Technologist:  Milana Na EMT-P     Nuclear Technologist:  Burna Mortimer Deal RT-N  Rest Procedure  Myocardial perfusion imaging was performed at rest 45 minutes following the intravenous administration of Myoview Technetium 56m Tetrofosmin.  Stress Procedure  The patient received IV Lexiscan 0.4 mg over 15-seconds.  Myoview injected at 30-seconds.  There were no significant changes with infusion.  Quantitative spect images were obtained after a 45 minute delay.  QPS Raw Data Images:  Normal; no motion artifact; normal heart/lung ratio. Stress Images:  NI: Uniform and normal uptake of tracer in all myocardial segments. Rest Images:  Normal homogeneous uptake in all areas of the myocardium. Subtraction (SDS):  Normal Transient Ischemic Dilatation:  .91  (Normal <1.22)  Lung/Heart Ratio:  .30  (Normal <0.45)  Quantitative Gated Spect Images QGS EDV:  128 ml QGS ESV:  49 ml QGS EF:  62 % QGS cine images:  normal  Findings Normal nuclear  study      Overall Impression  Exercise Capacity: Lexiscan BP Response: Normal blood pressure response. Clinical Symptoms: Dyspnea ECG Impression: No significant ST segment change suggestive of ischemia. Overall Impression: Normal stress nuclear study. Overall Impression Comments: Normal  Appended Document: Cardiology Nuclear Study No ischemia. EF normal. cdm  Appended Document: Cardiology Nuclear Study Dr. Clifton James reviewed with pt at office visit 09/02/09

## 2010-08-01 ENCOUNTER — Other Ambulatory Visit: Payer: Self-pay | Admitting: Oncology

## 2010-08-01 ENCOUNTER — Encounter (HOSPITAL_BASED_OUTPATIENT_CLINIC_OR_DEPARTMENT_OTHER): Payer: 59 | Admitting: Oncology

## 2010-08-01 DIAGNOSIS — D890 Polyclonal hypergammaglobulinemia: Secondary | ICD-10-CM

## 2010-08-01 DIAGNOSIS — R945 Abnormal results of liver function studies: Secondary | ICD-10-CM

## 2010-08-01 LAB — CBC WITH DIFFERENTIAL/PLATELET
BASO%: 0.8 % (ref 0.0–2.0)
Basophils Absolute: 0.1 10*3/uL (ref 0.0–0.1)
EOS%: 5.6 % (ref 0.0–7.0)
LYMPH%: 29.8 % (ref 14.0–49.0)
MCHC: 33.7 g/dL (ref 32.0–36.0)
MCV: 84.3 fL (ref 79.3–98.0)
NEUT%: 56.2 % (ref 39.0–75.0)
Platelets: 173 10*3/uL (ref 140–400)
RDW: 15.2 % — ABNORMAL HIGH (ref 11.0–14.6)

## 2010-08-01 LAB — COMPREHENSIVE METABOLIC PANEL
ALT: 55 U/L — ABNORMAL HIGH (ref 0–53)
BUN: 17 mg/dL (ref 6–23)
CO2: 25 mEq/L (ref 19–32)
Calcium: 9.7 mg/dL (ref 8.4–10.5)
Chloride: 106 mEq/L (ref 96–112)
Creatinine, Ser: 1.24 mg/dL (ref 0.40–1.50)
Glucose, Bld: 120 mg/dL — ABNORMAL HIGH (ref 70–99)
Potassium: 4.3 mEq/L (ref 3.5–5.3)

## 2010-08-05 LAB — PROTEIN ELECTROPHORESIS, SERUM
Alpha-1-Globulin: 4.6 % (ref 2.9–4.9)
Alpha-2-Globulin: 10.8 % (ref 7.1–11.8)
Beta Globulin: 7 % (ref 4.7–7.2)
Gamma Globulin: 9.8 % — ABNORMAL LOW (ref 11.1–18.8)
Total Protein, Serum Electrophoresis: 7 g/dL (ref 6.0–8.3)

## 2010-08-05 LAB — KAPPA/LAMBDA LIGHT CHAINS
Kappa free light chain: 0.71 mg/dL (ref 0.33–1.94)
Lambda Free Lght Chn: <0.47 mg/dL — ABNORMAL LOW (ref 0.57–2.63)

## 2010-08-05 LAB — HEPATITIS B SURFACE ANTIBODY,QUALITATIVE: Hep B S Ab: NEGATIVE

## 2010-08-05 LAB — HEPATITIS B SURFACE ANTIGEN: Hepatitis B Surface Ag: NEGATIVE

## 2010-08-05 LAB — HEPATITIS B CORE ANTIBODY, TOTAL: Hep B Core Total Ab: NEGATIVE

## 2010-08-05 LAB — HEPATITIS C ANTIBODY: HCV Ab: NEGATIVE

## 2010-08-19 ENCOUNTER — Encounter: Payer: Self-pay | Admitting: Oncology

## 2010-10-14 DIAGNOSIS — R413 Other amnesia: Secondary | ICD-10-CM

## 2010-10-14 DIAGNOSIS — F4321 Adjustment disorder with depressed mood: Secondary | ICD-10-CM

## 2010-11-04 NOTE — Assessment & Plan Note (Signed)
[facility]                         GASTROENTEROLOGY OFFICE NOTE   MIKKEL, CHARRETTE                       MRN:          [id number]  DATE:11/03/2006                            DOB:          Apr 03, 1956    REFERRING PHYSICIAN:  Tammy R. Collins Scotland, M.D.   REASON FOR REFERRAL:  Left sided abdominal pain and change in bowel  habits.   HISTORY OF PRESENT ILLNESS:  Mr. Kestler is a 55 year old white male  referred through the courtesy of Dr. Collins Scotland.  He relates about an 8 to 10-  month history of intermittent left sided abdominal pain, sometimes left  upper quadrant, and sometimes left lower quadrant.  He has had problems  with constipation for about the past 6 months and occasionally has had  small episodes of diarrhea.  He also notes bloating for about the past 6  months.  He has noted an occasional red tint to his stool but he is not  clear that he has seen blood.  He denies hematochezia and melena.  He  states he was treated for diverticulitis in 2006.  In addition, he has  lower back pain that decreases with bowel movements and also decreases  with ambulation and changes in position.  He states he has been  evaluated by Dr. Ewing Schlein previously for GERD.  He had a history of cough  related to syncope and it seems like it was eventually determined he had  reflux leading to the cough.  He has been treated for chronic GERD and  is currently on antireflux measures and Protonix on a daily basis.  He  notes no weight loss, nausea, vomiting, chest pain, or change in stool  caliber.  No family history of colon cancer, colon polyps, or  inflammatory bowel disease.   PAST MEDICAL HISTORY:  1. Hypertension.  2. Hyperlipidemia.  3. History of kidney stones.  4. History of headaches.  5. Sleep apnea.  6. Diverticulosis.  7. Status post hernia repair in 2007.  8. Status post kidney stone extraction in 2004.  9. Status post right elbow and right shoulder surgery in  1998.   CURRENT MEDICATIONS:  Listed on the chart, updated and reviewed.   MEDICATION ALLERGIES:  PROBABLE ALLERGY TO PENICILLIN.   SOCIAL HISTORY:  Per the hand written form.   REVIEW OF SYSTEMS:  Per the hand written form.   PHYSICAL EXAMINATION:  GENERAL:  A well developed, overweight white male  in no acute distress.  VITAL SIGNS:  Height 5 fet 9 inches, weight 263.2 pounds, blood pressure  is 150/98, pulse 76 and regular.  HEENT:  Anicteric sclerae.  Oropharynx clear.  CHEST:  Clear to auscultation bilaterally.  CARDIAC:  Regular rate and rhythm without murmurs appreciated.  ABDOMEN:  Soft, nontender, nondistended.  Normoactive bowel sounds.  No  palpable organomegaly, masses, or hernias.  RECTAL:  Deferred to time of colonoscopy.  EXTREMITIES:  Without clubbing, cyanosis, or edema.  NEUROLOGIC:  Alert and oriented x3.  Grossly nonfocal.   ASSESSMENT/PLAN:  1. Left sided abdominal pain associated with a 6 month history of  constipation and abdominal bloating.  Rule out colorectal      neoplasms, functional constipation, and other disorders.  He is to      begin a high fiber diet with increased fluid intake.  He may use      milk of magnesia or MiraLax as needed for constipation.  Risks,      benefits, and alternatives to colonoscopy with possible biopsy and      possible polypectomy discussed with the patient and he consents to      proceed.  This will be scheduled electively.  2. Gastroesophageal reflux disease with possible LPR and cough.  The      symptoms are controlled on current regimen.  Continue Protonix 40      mg orally daily and standard antireflux measures.     Venita Lick. Russella Dar, MD, [facility]  Electronically Signed    MTS/MedQ  DD: 11/08/2006  DT: 11/08/2006  Job #: [id number]   cc:   Tammy R. Collins Scotland, M.D.

## 2010-11-07 NOTE — Op Note (Signed)
NAME:  Grant Williams, Grant Williams NO.:  [id number]   MEDICAL RECORD NO.:  [id number]                   PATIENT TYPE:  AMB   LOCATION:  DAY                                  FACILITY:  [facility]   PHYSICIAN:  Excell Seltzer. Annabell Howells, M.D.                 DATE OF BIRTH:  12/13/1955   DATE OF PROCEDURE:  09/25/2002  DATE OF DISCHARGE:                                 OPERATIVE REPORT   PROCEDURE:  Right ureteroscopic stone extraction with insertion of right  double-J stent.   PREOPERATIVE DIAGNOSIS:  Right ureteral stone.   POSTOPERATIVE DIAGNOSIS:  Right ureteral stone.   SURGEON:  Excell Seltzer. Annabell Howells, M.D.   ANESTHESIA:  General.   DRAINS:  A 6 x 24 right double-J stent.   COMPLICATIONS:  None.   INDICATIONS:  Mr. Vo is a 55 year old white male, who came from the  emergency room with a history of ureteral stone.  He has had intermittent  symptoms for four months, all of which suggests the stone has been in the  same location.  After discussion of treatment options, he has elected to  undergo ureteroscopic stone extraction.   FINDINGS AT PROCEDURE:  The patient was taken to the operating room.  He had  been given p.o. antibiotic preoperatively.  General anesthetic was induced.  He was placed in lithotomy position.  His perineum and genitalia were  prepped with Betadine solution.  He was draped in the usual sterile fashion.  Cystoscopy was performed using a 22 [place] scope and 12 and 70 degree  lenses.  Examination revealed a normal urethra and intact external  sphincter.  The prostatic urethra was short without obstruction.  Examination of the bladder revealed a smooth wall without tumors or  inflammation.  There was a very small fleck of stone in the base of the  bladder which was irrigated out.  The ureteral orifices were unremarkable.  The cystoscope was then removed, and a 6 [place] short ureteroscope was  inserted.  An attempt was made to cannulate the right ureteral  orifice.  This was initially unsuccessful.  A guidewire was placed through the scope.  Once again, I was unable to get the scope beyond the distal 2 cm due to some  stricturing.  The ureteroscope was removed, leaving the wire in place, and a  4 cm 15 French balloon was then placed over the wire, across the distal  ureter, and inflated to 16 atmospheres.  It dilated easily without waisting.  It was then advanced 2 cm and inflated again.  At this point, the balloon  was removed, leaving the wire in place.  The ureteroscope was then  reinserted alongside the wire.  The stone was visualized.  It was grasped  with a nitinol basket and easily removed from the ureter.  Once the stone  had been removed, the cystoscope was reinserted over the wire,  and a 6  French 24 cm double-J stent with string was inserted without difficulty to  the kidney under fluoroscopic guidance.  The wire was removed leaving a good  coil in the kidney and good coil in the bladder.  The bladder was drained;  the cystoscope was removed, leaving the stent string exiting the urethra.  The  position of the stent was checked with fluoroscopy and was found to be  adequate.  The stent string was then secured to the patient's penis.  The  patient was taken down from lithotomy position.  His anesthetic was  reversed.  He was removed to the recovery room in stable condition with no  complications.                                               Excell Seltzer. Annabell Howells, M.D.    JJW/MEDQ  D:  09/25/2002  T:  09/25/2002  Job:  [id number]

## 2010-11-07 NOTE — Cardiovascular Report (Signed)
Grant Williams, Grant Williams NO.:  [id number]   MEDICAL RECORD NO.:  [id number]          PATIENT TYPE:  OIB   LOCATION:  2899                         FACILITY:  [facility]   PHYSICIAN:  Armanda Magic, M.D.     DATE OF BIRTH:  02-09-1956   DATE OF PROCEDURE:  03/27/2005  DATE OF DISCHARGE:                              CARDIAC CATHETERIZATION   REFERRING PHYSICIAN:  Dr. Lupe Carney.   PROCEDURE:  Left heart catheterization with coronary angiography and left  ventriculography.   OPERATOR:  Armanda Magic, M.D.   INDICATIONS:  Chest pain and abnormal Cardiolite.   COMPLICATIONS:  None.   INTRAVENOUS ACCESS:  Via right femoral artery 6-French sheath.   INDICATION:  This is a very pleasant 55 year old white male with a history  of hypertension, who presented with some atypical chest pain and had an  abnormal stress Cardiolite study with a reversible defect in the inferior  wall; he now presents for cardiac catheterization.   DESCRIPTION OF PROCEDURE:  The patient was brought to cardiac  catheterization laboratory in a fasting non-sedated state.  Informed consent  was obtained.  The patient was connected to continuous heart rate and pulse  oximetry monitoring and intermittent blood pressure monitoring.  The patient  was sedated with 2 mg of IV Versed.  Using a modified Seldinger technique, a  6-French sheath was placed in the right femoral artery.  Under fluoroscopic  guidance, a 6-French JL4 catheter was placed in the left coronary artery.  This catheter could not adequately engage the left coronary ostium, so it  was exchanged out for a 6-French JL5 catheter, which successfully engaged  the coronary ostium.  Multiple cine films were taken in 30-degree RAO and 40-  degree LAO views.  This catheter was then exchanged out over a guidewire for  6-French JR4 catheter, which could not engage the right coronary ostium.  It  was exchanged out for a 6-French non-torque right, which  engaged the  coronary ostium, and multiple cine films were taken in 30-degree RAO and 40-  degree LAO views.  This catheter was then exchanged out over a guidewire for  6-French angled pigtail catheter which was placed under fluoroscopic  guidance into the left ventricular cavity.  Left ventriculography was  performed in a 30-degree RAO view using a total of 30 mL of contrast at 15  mL per second.  The catheter was then pulled back across the aortic valve  with no significant gradient noted.  At the end of the procedure, all  catheters and sheaths were removed.  Manual compression was performed until  adequate hemostasis was obtained.  The patient was transferred back to the  room in stable condition.   RESULTS:  The left main coronary artery is widely patent and bifurcates into  a left anterior descending artery and left circumflex artery.  The left  circumflex artery is widely patent throughout its course, giving rise to 1  very large obtuse marginal branch which is widely patent.  The left anterior  descending artery is widely patent proximally and gives rise to a  first  diagonal branch, which is large and widely patent.  Just after the takeoff  of the first diagonal branch is an eccentric 40% to 50% narrowing in the mid  LAD.  The LAD then gives off a second diagonal branch, again which is fairly  moderate size and widely patent.  Just distal to takeoff of the second  diagonal, there is a 20% to 30% narrowing of the LAD.  The LAD then  traverses the apex and is widely patent the rest of its course.   The right coronary is widely patent throughout its course, bifurcating  distally into a posterior descending artery and posterior lateral artery,  both of which are widely patent.   Left ventriculography shows normal LV systolic function, LV pressure 118/4  mmHg, aortic pressure 118/73 mmHg.   ASSESSMENT:  1.  Nonobstructive coronary disease.  2.  Normal left ventricular function.   3.  Hypertension.   PLAN:  Continue current medications.  Start aspirin 325 mg a day for  nonobstructive coronary disease.  Discharge to home after IV fluid and  bedrest.  Follow up with me in 2 weeks for groin check.      Armanda Magic, M.D.  Electronically Signed     TT/MEDQ  D:  03/27/2005  T:  03/27/2005  Job:  [id number]   cc:   L. Lupe Carney, M.D.  Fax: [phone]

## 2010-11-07 NOTE — Op Note (Signed)
NAMEANANIAS, KOLANDER NO.:  [id number]   MEDICAL RECORD NO.:  [id number]          PATIENT TYPE:  AMB   LOCATION:  [facility]                         FACILITY:  [facility]   PHYSICIAN:  Thomas A. Cornett, M.D.DATE OF BIRTH:  [birth year]   DATE OF PROCEDURE:  08/05/2005  DATE OF DISCHARGE:                                 OPERATIVE REPORT   PREOPERATIVE DIAGNOSES:  1.  Umbilical hernia.  2.  Large left inguinal hernia, reducible.   POSTOPERATIVE DIAGNOSES:  1.  Umbilical hernia.  2.  Large direct left inguinal hernia.   PROCEDURE:  1.  Repair of left inguinal hernia with mesh.  2.  Repair of umbilical hernia mesh.   SURGEON:  Dr. Harriette Bouillon.   ANESTHESIA:  LMA switched to general endotracheal anesthesia. Local  anesthesia used was approximately 28 mL of 0.25% Sensorcaine local.   ESTIMATED BLOOD LOSS:  30 mL.   INDICATIONS FOR PROCEDURE:  The patient is a 55 year old male with a very  large left inguinal hernia. He also has a moderate-sized umbilical hernia.  He wished to have both of these repaired due to discomfort and pain. After  discussion of the procedure, I received informed consent and we agreed to  proceed.   DESCRIPTION OF PROCEDURE:  The patient was brought to the operating room,  placed supine. After induction of LMA anesthesia, the left inguinal region  and periumbilical region were shaved, prepped and draped in a sterile  fashion. The umbilical hernia was addressed first. An incision was made just  below the umbilicus. Dissection was carried down until I encountered a  hernia sac and dissected this away from the undersurface in the umbilicus. I  then dissected out a circumferential fascial defect measuring about 2 cm. I  placed my finger in the preperitoneal space, swept around to push the  peritoneum away from the undersurface of the fascia bluntly with a small  gauze. At this point, a small Ventralex mesh was brought onto the field and  placed  in the preperitoneal space, smooth side down. I secured the upper  surface of the under edge of the fascia circumferentially and then the tails  were cut and passed off the field. I then closed tissue over the mesh to  cover it to cover the defect as well as the mesh. 2-0 Vicryl was used to  tack the umbilicus back down to the fascia and 4-0 Monocryl was used to  close the skin. Sterile dressings were then applied.   Next attention was turned to his left inguinal hernia. The left inguinal  region was addressed. A left groin incision was made just over the inguinal  canal. Dissection was carried down through a very deep layer of subcutaneous  fat. We dissected down and found a very large hernia defect that was  protruding out the external ring. This was very easily reducible though.  Once I identified the aponeurosis of the external oblique, I opened this  with the scalpel and Metzenbaum scissors to further expose this. Exposure  was extremely difficult due to his obesity during this case.  Once I was able  to do this, I was able to identify the hernia and dissect it away from the  cord structures. This was a very large direct hernia and he had absolutely  no inguinal floor left on that side. He was under an LMA so we were able to  reduce it but it would protrude back out when we were obscuring our  operative field. We at this point in the case asked that the patient be put  under general anesthesia which was done quite easily and then paralyzed  which made the case much easier. I was able to dissect out the remainder of  the floor of the inguinal canal into the preperitoneal space and reduce all  the contents which appeared to be mostly omentum back into the abdominal  cavity without any difficulty whatsoever. Once I did this, an extended piece  of Prolene hernia system was used and I was able to place this in the  preperitoneal space. Once we were able to do this extended mesh was then   brought on top of this and I secured it with #0 Vicryl to the pubic tubercle  inferiorly and then used two other 3-0 Vicryl sutures to secure the mesh  laterally. At this point then I circumferentially tacked the onlay piece to  the shelving edge of the inguinal ligament down to the pubic tubercle as  well and to the transversalis abdominis muscle medially with interrupted #0  Novofil circumferentially. I also took bites of the external oblique fascia.  Since the floor was so weak, I was afraid that the mesh would not hold so we  went a little wider and took some bites of it, especially on the inferior  portion of the mesh. A small slit was cut through the cord structures and I  sutured the two pieces of mesh together at this point with #0 Novofil and  then tacked that edge to the shelving edge of the inguinal ligament.  Superiorly, we put sutures in the muscle of the internal oblique to help  secure it down. The ilioinguinal nerve was divided in this setting due to  the large hernia and the difficulty during the case in fear of this being  entrapped in the mesh. Irrigation was used and suctioned out. Hemostasis was  excellent. The cord structures were carefully examined and I saw no evidence  of injury to the cord nor did I see any evidence of an indirect hernia.  There was a significant amount of fat around the cord. At this point in  time, I closed the external oblique with a 2-0 Vicryl. 3-0 Vicryl pop-offs  were used to close the subcutaneous fat and 4-0 Monocryl was used to close  the skin. All sponge, needle and instruments were found to be correct at  this portion of the case. All final counts were correct x2. Sterile  dressings were applied. The patient was awoke and taken to recovery in  satisfactory condition.      Thomas A. Cornett, M.D.  Electronically Signed     TAC/MEDQ  D:  08/05/2005  T:  08/06/2005  Job:  [id number]  cc:   L. Lupe Carney, M.D.  Fax: [phone]

## 2011-02-26 ENCOUNTER — Other Ambulatory Visit: Payer: Self-pay | Admitting: Sports Medicine

## 2011-02-26 DIAGNOSIS — M25511 Pain in right shoulder: Secondary | ICD-10-CM

## 2011-03-03 ENCOUNTER — Ambulatory Visit
Admission: RE | Admit: 2011-03-03 | Discharge: 2011-03-03 | Disposition: A | Discharge status: Home / Self Care | Payer: 59 | Source: Ambulatory Visit | Attending: Sports Medicine | Admitting: Sports Medicine

## 2011-03-03 DIAGNOSIS — M25511 Pain in right shoulder: Secondary | ICD-10-CM

## 2011-04-27 DIAGNOSIS — M545 Low back pain, unspecified: Secondary | ICD-10-CM | POA: Insufficient documentation

## 2012-05-31 DIAGNOSIS — M546 Pain in thoracic spine: Secondary | ICD-10-CM | POA: Insufficient documentation

## 2013-01-31 DIAGNOSIS — G5600 Carpal tunnel syndrome, unspecified upper limb: Secondary | ICD-10-CM | POA: Insufficient documentation

## 2014-03-06 ENCOUNTER — Encounter: Payer: Self-pay | Admitting: Endocrinology

## 2014-03-06 ENCOUNTER — Ambulatory Visit (INDEPENDENT_AMBULATORY_CARE_PROVIDER_SITE_OTHER): Payer: 59 | Admitting: Endocrinology

## 2014-03-06 VITALS — BP 118/78 | HR 112 | Temp 98.4°F | Ht 70.0 in | Wt 270.0 lb

## 2014-03-06 DIAGNOSIS — E1065 Type 1 diabetes mellitus with hyperglycemia: Principal | ICD-10-CM

## 2014-03-06 DIAGNOSIS — Z794 Long term (current) use of insulin: Secondary | ICD-10-CM

## 2014-03-06 DIAGNOSIS — E1049 Type 1 diabetes mellitus with other diabetic neurological complication: Secondary | ICD-10-CM

## 2014-03-06 DIAGNOSIS — E119 Type 2 diabetes mellitus without complications: Secondary | ICD-10-CM | POA: Insufficient documentation

## 2014-03-06 MED ORDER — INSULIN GLARGINE 100 UNIT/ML SOLOSTAR PEN: 50.0000 [IU] | PEN_INJECTOR | Freq: Two times a day (BID) | SUBCUTANEOUS | Status: DC

## 2014-03-06 NOTE — Progress Notes (Signed)
Subjective:    Patient ID: Grant Williams, male    DOB: 02-17-1956, 58 y.o.   MRN: [id number]  HPI pt states DM was dx'ed in 2008; he has severe painful neuropathy of the lower extremities; and associated CAD; he has been on insulin since august of 2015; pt says his diet is poor, and exercise is limited by arthralgias; he has never had pancreatitis, severe hypoglycemia or DKA.  He takes lantus, 100 units qd.  he brings a record of his cbg's which i have reviewed today.  It varies from 91-220.  There is no trend throughout the day.   No past medical history on file.  No past surgical history on file.  History   Social History  . Marital Status: Married    Spouse Name: N/A    Number of Children: N/A  . Years of Education: N/A   Occupational History  . Not on file.   Social History Main Topics  . Smoking status: Former Games developer  . Smokeless tobacco: Not on file     Comment: 30 years ago  . Alcohol Use: Yes  . Drug Use: Not on file  . Sexual Activity: Not on file   Other Topics Concern  . Not on file   Social History Narrative  . No narrative on file    No current outpatient prescriptions on file prior to visit.   No current facility-administered medications on file prior to visit.    Allergies  Allergen Reactions  . Penicillins     Family History  Problem Relation Age of Onset  . Diabetes Father     BP 118/78  Pulse 112  Temp(Src) 98.4 F (36.9 C) (Oral)  Ht  (1.778 m)  Wt 270 lb (122.471 kg)  BMI 38.74 kg/m2  SpO2 94%  Review of Systems denies blurry vision,chest pain, sob, n/v, excessive diaphoresis, hypoglycemia, depression, cold intolerance, rhinorrhea, and easy bruising.  He has weight gain, leg cramps, frequent urination, rhinorrhea, and headache.     Objective:   Physical Exam VS: see vs page GEN: no distress.  Morbid obesity HEAD: head: no deformity eyes: no periorbital swelling, no proptosis external nose and ears are normal mouth: no  lesion seen NECK: supple, thyroid is not enlarged CHEST WALL: no deformity LUNGS: clear to auscultation BREASTS:  No gynecomastia CV: reg rate and rhythm, no murmur ABD: abdomen is soft, nontender.  no hepatosplenomegaly.  not distended.  no hernia MUSCULOSKELETAL: muscle bulk and strength are grossly normal.  no obvious joint swelling.  gait is normal and steady EXTEMITIES: no deformity.  no ulcer on the feet.  feet are of normal color and temp.  1+ bilat leg edema PULSES: dorsalis pedis intact bilat.  no carotid bruit NEURO:  cn 2-12 grossly intact.   readily moves all 4's.  sensation is intact to touch on the feet, but decreased from normal SKIN:  Normal texture and temperature.  No rash or suspicious lesion is visible.   NODES:  None palpable at the neck PSYCH: alert, well-oriented.  Does not appear anxious nor depressed.   outside test results are reviewed: A1c=11%  i have reviewed the following outside records: Office notes.  i reviewed electrocardiogram (2011)     Assessment & Plan:  DM: severe exacerbation: He declines multiple daily injections. Morbid obesity, new. Painful neuropathy: this limits exercise rx of DM.  He is advised to do his best.   Patient is advised the following: Patient Instructions  good diet  and exercise habits significanly improve the control of your diabetes.  please let me know if you wish to be referred to a dietician.  high blood sugar is very risky to your health.  you should see an eye doctor and dentist every year.  You are at higher than average risk for pneumonia and hepatitis-B.  You should be vaccinated against both.   controlling your blood pressure and cholesterol drastically reduces the damage diabetes does to your body.  this also applies to quitting smoking.  please discuss these with your doctor.  check your blood sugar twice a day.  vary the time of day when you check, between before the 3 meals, and at bedtime.  also check if you  have symptoms of your blood sugar being too high or too low.  please keep a record of the readings and bring it to your next appointment here.  You can write it on any piece of paper.  please call us sooner if your blood sugar goes below 70, or if you have a lot of readings over 200.   Please see a weight-loss surgery specialist.  you will receive a phone call, about a day and time for an informational meeting. Please stop taking the glimepiride and metformin. Please come back for a follow-up appointment in 6 weeks.

## 2014-03-06 NOTE — Patient Instructions (Addendum)
good diet and exercise habits significanly improve the control of your diabetes.  please let me know if you wish to be referred to a dietician.  high blood sugar is very risky to your health.  you should see an eye doctor and dentist every year.  You are at higher than average risk for pneumonia and hepatitis-B.  You should be vaccinated against both.   controlling your blood pressure and cholesterol drastically reduces the damage diabetes does to your body.  this also applies to quitting smoking.  please discuss these with your doctor.  check your blood sugar twice a day.  vary the time of day when you check, between before the 3 meals, and at bedtime.  also check if you have symptoms of your blood sugar being too high or too low.  please keep a record of the readings and bring it to your next appointment here.  You can write it on any piece of paper.  please call us sooner if your blood sugar goes below 70, or if you have a lot of readings over 200.   Please see a weight-loss surgery specialist.  you will receive a phone call, about a day and time for an informational meeting. Please stop taking the glimepiride and metformin. Please come back for a follow-up appointment in 6 weeks.

## 2014-04-17 ENCOUNTER — Ambulatory Visit (INDEPENDENT_AMBULATORY_CARE_PROVIDER_SITE_OTHER): Payer: Medicare Other | Admitting: Endocrinology

## 2014-04-17 ENCOUNTER — Encounter: Payer: Self-pay | Admitting: Endocrinology

## 2014-04-17 VITALS — BP 138/90 | HR 79 | Temp 98.5°F | Ht 70.0 in | Wt 273.0 lb

## 2014-04-17 DIAGNOSIS — E1159 Type 2 diabetes mellitus with other circulatory complications: Secondary | ICD-10-CM

## 2014-04-17 MED ORDER — INSULIN GLARGINE 100 UNIT/ML SOLOSTAR PEN: 130.0000 [IU] | PEN_INJECTOR | SUBCUTANEOUS | Status: DC

## 2014-04-17 NOTE — Progress Notes (Signed)
Subjective:    Patient ID: Grant Williams, male    DOB: March 21, 1956, 58 y.o.   MRN y.o.   MRN: [id number]  HPI Pt returns for f/u of diabetes mellitus: DM type: Insulin-requiring type 2 Dx'ed: 2008 Complications: painful neuropathy of the lower extremities and CAD Therapy: insulin since 2015 DKA: never Severe hypoglycemia: never Pancreatitis: never Other: ; He declines multiple daily injections.  Interval history: he brings a record of his cbg's which i have reviewed today.  It varies from 150-300.  There is no trend throughout the day.  he has gained a few lbs. No past medical history on file.  No past surgical history on file.  History   Social History  . Marital Status: Married    Spouse Name: N/A    Number of Children: N/A  . Years of Education: N/A   Occupational History  . Not on file.   Social History Main Topics  . Smoking status: Former Games developermoker  . Smokeless tobacco: Not on file     Comment: 30 years ago  . Alcohol Use: Yes  . Drug Use: Not on file  . Sexual Activity: Not on file   Other Topics Concern  . Not on file   Social History Narrative  . No narrative on file    Current Outpatient Prescriptions on File Prior to Visit  Medication Sig Dispense Refill  . ACCU-CHEK COMPACT PLUS test strip       . amLODipine (NORVASC) 10 MG tablet       . ARIPiprazole (ABILIFY) 5 MG tablet       . DULoxetine (CYMBALTA) 60 MG capsule       . fenofibrate 160 MG tablet       . gabapentin (NEURONTIN) 800 MG tablet       . HYDROcodone-acetaminophen (NORCO/VICODIN) 5-325 MG per tablet       . lisinopril (PRINIVIL,ZESTRIL) 40 MG tablet       . methocarbamol (ROBAXIN) 500 MG tablet       . metoprolol (LOPRESSOR) 100 MG tablet       . pantoprazole (PROTONIX) 40 MG tablet       . pravastatin (PRAVACHOL) 80 MG tablet       . rOPINIRole (REQUIP) 1 MG tablet        No current facility-administered medications on file prior to visit.    Allergies  Allergen Reactions  . Penicillins      Family History  Problem Relation Age of Onset  . Diabetes Father    BP 138/90  Pulse 79  Temp(Src) 98.5 F (36.9 C) (Oral)  Ht 5\' 10"  (1.778 m)  Wt 273 lb (123.832 kg)  BMI 39.17 kg/m2  SpO2 97%  Review of Systems He denies hypoglycemia and n/v.      Objective:   Physical Exam VITAL SIGNS:  See vs page GENERAL: no distress Pulses: dorsalis pedis intact bilat.   Feet: no deformity.  1+ bilat leg edema Skin:  no ulcer on the feet.  normal color and temp. Neuro: sensation is intact to touch on the feet, but decreased from normal.     Assessment & Plan:  DM: severe exacerbation Obesity, worse: he is advised to re-lose.    Patient is advised the following: Patient Instructions  check your blood sugar twice a day.  vary the time of day when you check, between before the 3 meals, and at bedtime.  also check if you have symptoms of your blood sugar being too high or too  low.  please keep a record of the readings and bring it to your next appointment here.  You can write it on any piece of paper.  please call us sooner if your blood sugar goes below 70, or if you have a lot of readings over 200.   Please come back for a follow-up appointment in 6 weeks.  Please increase the lantus to 130 units each morning. This is probably not enough. On this type of insulin schedule, you should eat meals on a regular schedule.  If a meal is missed or significantly delayed, your blood sugar could go low.   Please call next week, to tell us about how your blood sugar is doing.

## 2014-04-17 NOTE — Patient Instructions (Addendum)
check your blood sugar twice a day.  vary the time of day when you check, between before the 3 meals, and at bedtime.  also check if you have symptoms of your blood sugar being too high or too low.  please keep a record of the readings and bring it to your next appointment here.  You can write it on any piece of paper.  please call us sooner if your blood sugar goes below 70, or if you have a lot of readings over 200.   Please come back for a follow-up appointment in 6 weeks.  Please increase the lantus to 130 units each morning. This is probably not enough. On this type of insulin schedule, you should eat meals on a regular schedule.  If a meal is missed or significantly delayed, your blood sugar could go low.   Please call next week, to tell us about how your blood sugar is doing.

## 2014-04-25 ENCOUNTER — Telehealth: Payer: Self-pay

## 2014-04-25 MED ORDER — INSULIN GLARGINE 100 UNIT/ML SOLOSTAR PEN: 160.0000 [IU] | PEN_INJECTOR | SUBCUTANEOUS | Status: DC

## 2014-04-25 NOTE — Telephone Encounter (Signed)
Pt advised of note below and voiced understanding. New rx sent to pharmacy per pt's request.

## 2014-04-25 NOTE — Telephone Encounter (Signed)
Ok, please increase lantus to 160 units qam i'll see you next time.  Please call sooner if it stays high.

## 2014-04-25 NOTE — Telephone Encounter (Signed)
Pt called to report blood sugar readings. Since ov on 10/27 in the morning pt blood sugar have been running in the 240s and the evening numbers have been in the 150s. Pt confirmed that he is using 130 units of lantus every morning.  Pleas advise, Thanks!

## 2014-05-07 ENCOUNTER — Telehealth: Payer: Self-pay | Admitting: Endocrinology

## 2014-05-07 NOTE — Telephone Encounter (Signed)
Pt advised of note below and voiced understanding.  

## 2014-05-07 NOTE — Telephone Encounter (Signed)
Requested call back from pt to discuss.  

## 2014-05-07 NOTE — Telephone Encounter (Signed)
See below, pt is currently taking 160 units of Lantus.  Thanks!

## 2014-05-07 NOTE — Telephone Encounter (Signed)
Patient B/S readings  Morning range 270 to 290 Evening range 130 to 180

## 2014-05-07 NOTE — Telephone Encounter (Signed)
please call patient: Please increase the insulin to 180 units qam i'll see you next time.

## 2014-05-21 ENCOUNTER — Telehealth: Payer: Self-pay | Admitting: Endocrinology

## 2014-05-21 MED ORDER — INSULIN GLARGINE 100 UNIT/ML SOLOSTAR PEN: 180.0000 [IU] | PEN_INJECTOR | SUBCUTANEOUS | Status: DC

## 2014-05-21 NOTE — Telephone Encounter (Signed)
Patients wife called stating that they are needing to have his Lantus Rx called in at 180 units daily  Please do this asap   Thank you

## 2014-05-21 NOTE — Telephone Encounter (Signed)
Rx sent to pharmacy   

## 2014-05-29 ENCOUNTER — Ambulatory Visit (INDEPENDENT_AMBULATORY_CARE_PROVIDER_SITE_OTHER): Payer: Medicare Other | Admitting: Endocrinology

## 2014-05-29 ENCOUNTER — Encounter: Payer: Self-pay | Admitting: Endocrinology

## 2014-05-29 VITALS — BP 141/84 | HR 92 | Temp 98.2°F | Ht 70.0 in | Wt 278.0 lb

## 2014-05-29 DIAGNOSIS — E1159 Type 2 diabetes mellitus with other circulatory complications: Secondary | ICD-10-CM

## 2014-05-29 MED ORDER — INSULIN GLARGINE 100 UNIT/ML SOLOSTAR PEN: 220.0000 [IU] | PEN_INJECTOR | Freq: Every day | SUBCUTANEOUS | Status: DC

## 2014-05-29 NOTE — Patient Instructions (Addendum)
check your blood sugar twice a day.  vary the time of day when you check, between before the 3 meals, and at bedtime.  also check if you have symptoms of your blood sugar being too high or too low.  please keep a record of the readings and bring it to your next appointment here.  You can write it on any piece of paper.  please call us sooner if your blood sugar goes below 70, or if you have a lot of readings over 200.   Please come back for a follow-up appointment in 6 weeks.  Please increase the lantus to 220 units each morning. This is probably not enough. On this type of insulin schedule, you should eat meals on a regular schedule.  If a meal is missed or significantly delayed, your blood sugar could go low.   Please call next week, to tell us about how your blood sugar is doing.   It is important to keep your feet warm.

## 2014-05-29 NOTE — Progress Notes (Signed)
Subjective:    Patient ID: Grant Williams, male    DOB: 13-Mar-1956, 58 y.o.   MRN: [id number]  HPI Pt returns for f/u of diabetes mellitus: DM type: Insulin-requiring type 2. Dx'ed: 2008.   Complications: painful neuropathy of the lower extremities and CAD.   Therapy: insulin since 2015 DKA: never.  Severe hypoglycemia: never Pancreatitis: never Other: He declines multiple daily injections.  Interval history: he brings a record of his cbg's which i have reviewed today.  It varies from 150-300.  There is no trend throughout the day.  Weight gain persists.   No past medical history on file.  No past surgical history on file.  History   Social History  . Marital Status: Married    Spouse Name: N/A    Number of Children: N/A  . Years of Education: N/A   Occupational History  . Not on file.   Social History Main Topics  . Smoking status: Former Games developermoker  . Smokeless tobacco: Not on file     Comment: 30 years ago  . Alcohol Use: Yes  . Drug Use: Not on file  . Sexual Activity: Not on file   Other Topics Concern  . Not on file   Social History Narrative    Current Outpatient Prescriptions on File Prior to Visit  Medication Sig Dispense Refill  . ACCU-CHEK COMPACT PLUS test strip     . amLODipine (NORVASC) 10 MG tablet     . ARIPiprazole (ABILIFY) 5 MG tablet     . DULoxetine (CYMBALTA) 60 MG capsule     . fenofibrate 160 MG tablet     . gabapentin (NEURONTIN) 800 MG tablet     . HYDROcodone-acetaminophen (NORCO/VICODIN) 5-325 MG per tablet     . lisinopril (PRINIVIL,ZESTRIL) 40 MG tablet     . methocarbamol (ROBAXIN) 500 MG tablet     . metoprolol (LOPRESSOR) 100 MG tablet     . pantoprazole (PROTONIX) 40 MG tablet     . pravastatin (PRAVACHOL) 80 MG tablet     . rOPINIRole (REQUIP) 1 MG tablet      No current facility-administered medications on file prior to visit.    Allergies  Allergen Reactions  . Penicillins     Family History  Problem Relation Age  of Onset  . Diabetes Father     BP 141/84 mmHg  Pulse 92  Temp(Src) 98.2 F (36.8 C) (Oral)  Ht 5\' 10"  (1.778 m)  Wt 278 lb (126.1 kg)  BMI 39.89 kg/m2  SpO2 94%  Review of Systems He denies hypoglycemia and n/v.    Objective:   Physical Exam VITAL SIGNS:  See vs page GENERAL: no distress Pulses: dorsalis pedis intact bilat.   Feet: no deformity. trace bilat leg edema  Skin: no ulcer on the feet. both feet are slightly cyanotic, with slow capillary refill.  slightly cool to touch.  Neuro: sensation is intact to touch on the feet, but decreased from normal.    No results found for: HGBA1C    Assessment & Plan:  DM: moderate exacerbation Sow capillary refill, prob due to small-vessel dz, new to me Weight gain, prob due to reduced glycosuria.  Pt is encouraged to re-lose.  Patient is advised the following: Patient Instructions  check your blood sugar twice a day.  vary the time of day when you check, between before the 3 meals, and at bedtime.  also check if you have symptoms of your blood sugar being too high  or too low.  please keep a record of the readings and bring it to your next appointment here.  You can write it on any piece of paper.  please call us sooner if your blood sugar goes below 70, or if you have a lot of readings over 200.   Please come back for a follow-up appointment in 6 weeks.  Please increase the lantus to 220 units each morning. This is probably not enough. On this type of insulin schedule, you should eat meals on a regular schedule.  If a meal is missed or significantly delayed, your blood sugar could go low.   Please call next week, to tell us about how your blood sugar is doing.   It is important to keep your feet warm.

## 2014-06-12 ENCOUNTER — Telehealth: Payer: Self-pay

## 2014-06-12 NOTE — Telephone Encounter (Signed)
Pt advised of note below and voiced understanding. Pt to call and report blood sugar readings in 3 to 4 days.

## 2014-06-12 NOTE — Telephone Encounter (Signed)
Needs to try changing Lantus to the evening before increasing the dose

## 2014-06-12 NOTE — Telephone Encounter (Signed)
Pt called to report his blood sugar readings.   06/09/2014  5am 209 4:30pm 238  06/10/2014  8am 214  12:30pm 196 7pm 295  06/11/2014  6am 328 10pm 183   Pt is taking 220 units of Lantus every morning. Could you review and please advise during Dr. Micky HughEllison's absence. Thanks!

## 2014-06-20 ENCOUNTER — Telehealth: Payer: Self-pay | Admitting: Internal Medicine

## 2014-06-20 ENCOUNTER — Other Ambulatory Visit: Payer: Self-pay | Admitting: *Deleted

## 2014-06-20 ENCOUNTER — Encounter: Payer: Self-pay | Admitting: Endocrinology

## 2014-06-20 ENCOUNTER — Encounter: Payer: Commercial Managed Care - PPO | Attending: Endocrinology | Admitting: Nutrition

## 2014-06-20 ENCOUNTER — Ambulatory Visit (INDEPENDENT_AMBULATORY_CARE_PROVIDER_SITE_OTHER): Payer: Commercial Managed Care - PPO | Admitting: Endocrinology

## 2014-06-20 VITALS — BP 153/90 | HR 79 | Temp 98.0°F | Resp 14 | Ht 70.0 in | Wt 280.6 lb

## 2014-06-20 DIAGNOSIS — E1165 Type 2 diabetes mellitus with hyperglycemia: Secondary | ICD-10-CM

## 2014-06-20 DIAGNOSIS — E119 Type 2 diabetes mellitus without complications: Secondary | ICD-10-CM | POA: Insufficient documentation

## 2014-06-20 DIAGNOSIS — IMO0002 Reserved for concepts with insufficient information to code with codable children: Secondary | ICD-10-CM

## 2014-06-20 LAB — GLUCOSE, POCT (MANUAL RESULT ENTRY): POC Glucose: 407 mg/dl — AB (ref 70–99)

## 2014-06-20 MED ORDER — METFORMIN HCL ER 500 MG PO TB24: 2000.0000 mg | ORAL_TABLET | Freq: Every day | ORAL | Status: DC

## 2014-06-20 NOTE — Telephone Encounter (Signed)
Patient wife call would like to talk to someone concerning her husband blood sugar, it is in the high 200 and 300. Please advise

## 2014-06-20 NOTE — Progress Notes (Signed)
Patient ID: Grant Williams, male   DOB: [birth year], [age] y.o.   MRN: [id number]   Reason for Appointment: Type II Diabetes follow-up   History of Present Illness   Diagnosis date:  2008  Previous history: He was apparently taking metformin 2000 mg, Amaryl for a few years before being referred here He was started on insulin when his A1c was 11% compared to 9% prior to that His PCP also tried him on Farxiga but this caused very frequent urination and he could not tolerate the 5 mg dose  Recent history: Insulin was started in 8/15, initially 10 units and then progressively increased Metformin was stopped at that time even though he was not having any side effects On his last visit his insulin was increased to 220 units but his blood sugars did not improve Since his blood sugars seemed higher in the morning he was told to switch his Lantus to the morning on 12/22 but his sugars overall have been higher since then He is having increased thirst and urination.  However he is drinking up to half a gallon of milk a day along with his water and diet drinks He has not had any nutritional counseling  Monitors blood glucose:  2.6 times a day.    Glucometer:  Accu-Chek .          Blood Glucose readings from meter download recently:   PRE-MEAL Breakfast Lunch  4-6 PM  Bedtime Overall   Glucose range  195- 328   196-308   223-332   183-301   132-364   Mean/median:     228    Hypoglycemia:  none         Meals: 3 meals per day.          Physical activity: exercise: off and on when able to go outside            Dietician visit: Most recent:     none  Weight control:  Wt Readings from Last 3 Encounters:  06/20/14 280 lb 9.6 oz (127.279 kg)  05/29/14 278 lb (126.1 kg)  04/17/14 273 lb (123.832 kg)          Complications:    neuropathy   Diabetes labs: last A1c in August was 11  No results found for: HGBA1C Lab Results  Component Value Date   CREATININE 1.24 08/01/2010       Medication  List       This list is accurate as of: 06/20/14  5:11 PM.  Always use your most recent med list.               ACCU-CHEK COMPACT PLUS test strip  Generic drug:  glucose blood     amLODipine 10 MG tablet  Commonly known as:  NORVASC     ARIPiprazole 5 MG tablet  Commonly known as:  ABILIFY     DULoxetine 60 MG capsule  Commonly known as:  CYMBALTA     fenofibrate 160 MG tablet     gabapentin 800 MG tablet  Commonly known as:  NEURONTIN     HYDROcodone-acetaminophen 5-325 MG per tablet  Commonly known as:  NORCO/VICODIN     lisinopril 40 MG tablet  Commonly known as:  PRINIVIL,ZESTRIL     metFORMIN 500 MG 24 hr tablet  Commonly known as:  GLUCOPHAGE-XR  Take 4 tablets (2,000 mg total) by mouth daily with supper.     methocarbamol 500 MG tablet  Commonly known as:  ROBAXIN  metoprolol 100 MG tablet  Commonly known as:  LOPRESSOR     pantoprazole 40 MG tablet  Commonly known as:  PROTONIX     pravastatin 80 MG tablet  Commonly known as:  PRAVACHOL     rOPINIRole 1 MG tablet  Commonly known as:  REQUIP     TOUJEO SOLOSTAR 300 UNIT/ML Sopn  Generic drug:  Insulin Glargine  Inject 240 Units into the skin.     VICTOZA 18 MG/3ML Sopn  Generic drug:  Liraglutide  Inject 1.2 mg into the skin daily.     zolpidem 6.25 MG CR tablet  Commonly known as:  AMBIEN CR        Allergies:  Allergies  Allergen Reactions  . Penicillins     No past medical history on file.  No past surgical history on file.  Family History  Problem Relation Age of Onset  . Diabetes Father     Social History:  reports that he has quit smoking. He does not have any smokeless tobacco history on file. He reports that he drinks alcohol. His drug history is not on file.  Review of Systems:  Hypertension: present Has recent swelling of feet        LABS:  creatinine normal in August   Examination:   BP 153/90 mmHg  Pulse 79  Temp(Src) 98 F (36.7 C)  Resp 14  Ht 5'  10" (1.778 m)  Wt 280 lb 9.6 oz (127.279 kg)  BMI 40.26 kg/m2  SpO2 94%  Body mass index is 40.26 kg/(m^2).    ASSESSMENT/ PLAN:  Diabetes type 2 with obesity  Poorly controlled diabetes, mostly related to obesity and insulin resistance Although he is only on basal insulin he does not appear to have much postprandial hyperglycemia and fasting readings are high However his diet is suboptimal and he is drinking a lot of milk as well as eating cereal including at bedtime  Discussed that he needs additional medications to help his insulin action including metformin He is intolerant to [place] and will not try this or Invokana again He is a good candidate for a GLP-1 drug also to help postprandial control and weight loss  Discussed with the patient the nature of GLP-1 drugs, the action on various organ systems, how they benefit blood glucose control, as well as the benefit of weight loss and  increase satiety . Explained possible side effects especially nausea and vomiting; discussed safety information in package insert. Described injection technique and dosage titration of Victoza  starting with 0.6 mg once a day at the same time for the first week and then increasing to 1.2 mg if no symptoms of nausea. Patient brochure on Victoza and co-pay card given  He will also switch Lantus to [facility] for better 24 hour coverage and more efficient action He was seen by nurse educator for instructions and education  Counseling time over 50% of today's 25 minute visit   Patient Instructions  Stop Milk  Change Lantus to Toujeo 240 units once daily  Start VICTOZA with the pen as shown once daily at the same time of the day.  Dial the dose to 0.6 mg on the pen for the first week.  You may inject in the stomach, thigh or arm. You may experience nausea in the first few days which usually gets better.  You will feel fullness of the stomach with starting the medication and should try to keep the portions at  meals small. After 1 week  increase the dose to 1.2mg  daily if no nausea present.   If any questions or concerns are present call the office or the Victoza [facility] helpline at [phone]. Visit [email or web address] for more useful information        Revonda Menter 06/20/2014, 5:11 PM

## 2014-06-20 NOTE — Telephone Encounter (Signed)
Please see below.

## 2014-06-20 NOTE — Patient Instructions (Addendum)
Stop Milk  Change Lantus to Toujeo 240 units once daily  Start VICTOZA with the pen as shown once daily at the same time of the day.  Dial the dose to 0.6 mg on the pen for the first week.  You may inject in the stomach, thigh or arm. You may experience nausea in the first few days which usually gets better.  You will feel fullness of the stomach with starting the medication and should try to keep the portions at meals small. After 1 week increase the dose to 1.2mg  daily if no nausea present.   If any questions or concerns are present call the office or the Victoza [facility] helpline at [phone]. Visit [email or web address] for more useful information

## 2014-06-20 NOTE — Telephone Encounter (Signed)
Returned pt's call. Sugar readings are as follows (please advise in Dr Zariah HughEllison's absence):  12/30 300 -  7:30 am  12/29 282 - 5 am  12/28 245 - 9 am  320 - 11:30 am  308 - 11:30 am  249 - 3:30 pm      332 - 2:30 pm   300 - 10 pm      301 - 11 pm  Pt stated that at last visit they changed his insulin from taking in the morning to taking it at night. Be advised.

## 2014-06-20 NOTE — Telephone Encounter (Signed)
Not clear what his problem is with his glucose control, can he come in this afternoon with his monitor?

## 2014-06-20 NOTE — Telephone Encounter (Signed)
Pt to come in at 4:00 pm today.

## 2014-06-25 NOTE — Progress Notes (Signed)
I  discussed with the patient and his wife, how the Victoza works, how/when to take it, and how to increase the dose.  He was given a starter kit, with directions for pen use, and dosage adjustments.  He was strongly encouraged to read this, and he agreed to do this.   We also discussed the Toujeo and how/when to inject, timing, and use of the pen.  He was given a starter kit of this, and again encouraged to read over it, and call if questions. He was given copay cards for both of these medications.      I reviewed his diet.  He is not drinking sweet drinks, nor fruit juices, but is eating " a lot of cereal and milk".  He was given other breakfast/night time snack choices to use instead of this.  He agreed to do this.    We reviewed the need to test blood sugars, and the goals for these readings.  He will test blood sugars acB, and 2hr. pc one meal--alternating the meal each day.  They had no final quesitons.

## 2014-06-25 NOTE — Patient Instructions (Signed)
Take Toujeo--240u once a day. Take Victoza 0.6 once a day. Increase the dose of Victoza to 1.2 after 7 days if no nausea

## 2014-07-03 ENCOUNTER — Other Ambulatory Visit: Payer: Self-pay

## 2014-07-03 MED ORDER — ONETOUCH VERIO IQ SYSTEM W/DEVICE KIT: PACK | Status: DC

## 2014-07-03 MED ORDER — GLUCOSE BLOOD VI STRP: ORAL_STRIP | Status: DC

## 2014-07-10 ENCOUNTER — Ambulatory Visit (INDEPENDENT_AMBULATORY_CARE_PROVIDER_SITE_OTHER): Payer: Commercial Managed Care - PPO | Admitting: Endocrinology

## 2014-07-10 ENCOUNTER — Encounter: Payer: Self-pay | Admitting: Endocrinology

## 2014-07-10 VITALS — BP 134/82 | HR 87 | Temp 98.3°F | Ht 70.0 in | Wt 280.0 lb

## 2014-07-10 DIAGNOSIS — E119 Type 2 diabetes mellitus without complications: Secondary | ICD-10-CM | POA: Diagnosis not present

## 2014-07-10 NOTE — Progress Notes (Signed)
Subjective:    Patient ID: Grant Williams, male    DOB: 1955/11/12, 59 y.o.   MRN: [id number]  HPI Pt returns for f/u of diabetes mellitus: DM type: Insulin-requiring type 2. Dx'ed: 2008.   Complications: painful neuropathy of the lower extremities and CAD.   Therapy: insulin since 2015.   DKA: never.  Severe hypoglycemia: never Pancreatitis: never.    Other: He declines multiple daily injections.   Interval history: he brings his cbg meter which i have reviewed today.  It varies from 85-150.  There is no trend throughout the day.  pt states he feels well in general. No past medical history on file.  No past surgical history on file.  History   Social History  . Marital Status: Married    Spouse Name: N/A    Number of Children: N/A  . Years of Education: N/A   Occupational History  . Not on file.   Social History Main Topics  . Smoking status: Former Research scientist (life sciences)  . Smokeless tobacco: Not on file     Comment: 30 years ago  . Alcohol Use: Yes  . Drug Use: Not on file  . Sexual Activity: Not on file   Other Topics Concern  . Not on file   Social History Narrative    Current Outpatient Prescriptions on File Prior to Visit  Medication Sig Dispense Refill  . ACCU-CHEK COMPACT PLUS test strip     . amLODipine (NORVASC) 10 MG tablet     . ARIPiprazole (ABILIFY) 5 MG tablet     . Blood Glucose Monitoring Suppl (ONETOUCH VERIO IQ SYSTEM) W/DEVICE KIT Use to check blood sugar 3 times per day. 1 kit 0  . DULoxetine (CYMBALTA) 60 MG capsule     . fenofibrate 160 MG tablet     . gabapentin (NEURONTIN) 800 MG tablet     . glucose blood (ONETOUCH VERIO) test strip Use to check blood sugar 3 times per day and lancets 3/day. 100 each 4  . HYDROcodone-acetaminophen (NORCO/VICODIN) 5-325 MG per tablet     . Insulin Glargine (TOUJEO SOLOSTAR) 300 UNIT/ML SOPN Inject 220 Units into the skin.     . Liraglutide (VICTOZA) 18 MG/3ML SOPN Inject 1.8 mg into the skin daily.     Marland Kitchen  lisinopril (PRINIVIL,ZESTRIL) 40 MG tablet     . metFORMIN (GLUCOPHAGE-XR) 500 MG 24 hr tablet Take 4 tablets (2,000 mg total) by mouth daily with supper. 120 tablet 3  . methocarbamol (ROBAXIN) 500 MG tablet     . metoprolol (LOPRESSOR) 100 MG tablet     . pantoprazole (PROTONIX) 40 MG tablet     . pravastatin (PRAVACHOL) 80 MG tablet     . rOPINIRole (REQUIP) 1 MG tablet     . zolpidem (AMBIEN CR) 6.25 MG CR tablet   0   No current facility-administered medications on file prior to visit.    Allergies  Allergen Reactions  . Penicillins     Family History  Problem Relation Age of Onset  . Diabetes Father     BP 134/82 mmHg  Pulse 87  Temp(Src) 98.3 F (36.8 C) (Oral)  Ht $R'5\' 10"'Hp$  (1.778 m)  Wt 280 lb (127.007 kg)  BMI 40.18 kg/m2  SpO2 92%  Review of Systems He denies hypoglycemia and weight change.     Objective:   Physical Exam VITAL SIGNS:  See vs page GENERAL: no distress Pulses: dorsalis pedis intact bilat.   Feet: no deformity. 1+ bilat leg  edema.  Skin: no ulcer on the feet. both feet are slightly cyanotic, with slow capillary refill. slightly cool to touch.  Neuro: sensation is intact to touch on the feet, but decreased from normal.      Assessment & Plan:  DM: control is improved.  Morbid obesity: not improved.   Patient is advised the following: Patient Instructions  Please reduce the Toujeo to 220 units once daily, and:  increase the victoza to 1.8 mg daily.   check your blood sugar twice a day.  vary the time of day when you check, between before the 3 meals, and at bedtime.  also check if you have symptoms of your blood sugar being too high or too low.  please keep a record of the readings and bring it to your next appointment here.  You can write it on any piece of paper.  please call us sooner if your blood sugar goes below 70, or if you have a lot of readings over 200.  Please continue to pursue the weight loss surgery.   Please come back for  a follow-up appointment in 2 months.

## 2014-07-10 NOTE — Patient Instructions (Addendum)
Please reduce the Toujeo to 220 units once daily, and:  increase the victoza to 1.8 mg daily.   check your blood sugar twice a day.  vary the time of day when you check, between before the 3 meals, and at bedtime.  also check if you have symptoms of your blood sugar being too high or too low.  please keep a record of the readings and bring it to your next appointment here.  You can write it on any piece of paper.  please call us sooner if your blood sugar goes below 70, or if you have a lot of readings over 200.  Please continue to pursue the weight loss surgery.   Please come back for a follow-up appointment in 2 months.

## 2014-08-07 ENCOUNTER — Telehealth: Payer: Self-pay | Admitting: Endocrinology

## 2014-08-07 MED ORDER — LIRAGLUTIDE 18 MG/3ML ~~LOC~~ SOPN: 1.8000 mg | PEN_INJECTOR | Freq: Every day | SUBCUTANEOUS | Status: DC

## 2014-08-07 NOTE — Telephone Encounter (Signed)
Pt advised that rx for victoza has been sent.

## 2014-08-07 NOTE — Telephone Encounter (Signed)
Patient states Dr. Everardo AllEllison increased his Victoza and he needs a new Rx   [facility]   Please advise   Thank you

## 2014-09-10 ENCOUNTER — Encounter: Payer: Self-pay | Admitting: Endocrinology

## 2014-09-10 ENCOUNTER — Ambulatory Visit (INDEPENDENT_AMBULATORY_CARE_PROVIDER_SITE_OTHER): Payer: Commercial Managed Care - PPO | Admitting: Endocrinology

## 2014-09-10 VITALS — BP 146/92 | HR 87 | Temp 98.2°F | Ht 70.0 in | Wt 274.0 lb

## 2014-09-10 DIAGNOSIS — E119 Type 2 diabetes mellitus without complications: Secondary | ICD-10-CM | POA: Diagnosis not present

## 2014-09-10 NOTE — Patient Instructions (Addendum)
blood tests are being requested for you today.  We'll let you know about the results.   check your blood sugar twice a day.  vary the time of day when you check, between before the 3 meals, and at bedtime.  also check if you have symptoms of your blood sugar being too high or too low.  please keep a record of the readings and bring it to your next appointment here.  You can write it on any piece of paper.  please call us sooner if your blood sugar goes below 70, or if you have a lot of readings over 200.  Please continue to pursue the weight loss surgery.   Please come back for a follow-up appointment in 3-4 months.

## 2014-09-10 NOTE — Progress Notes (Signed)
Subjective:    Patient ID: Grant Williams, male    DOB: Jul 05, 1955, 59 y.o.   MRN: [id number]  HPI Pt returns for f/u of diabetes mellitus: Hx is from pt and wife DM type: Insulin-requiring type 2. Dx'ed: 2008.   Complications: painful neuropathy of the lower extremities and CAD.   Therapy: insulin since 2015.   DKA: never.  Severe hypoglycemia: never Pancreatitis: never.    Other: He declines multiple daily injections.   Interval history: no cbg record, but states cbg's vary from 100-300.  It is in general higher as the day goes on No past medical history on file.  No past surgical history on file.  History   Social History  . Marital Status: Married    Spouse Name: N/A  . Number of Children: N/A  . Years of Education: N/A   Occupational History  . Not on file.   Social History Main Topics  . Smoking status: Former Research scientist (life sciences)  . Smokeless tobacco: Not on file     Comment: 30 years ago  . Alcohol Use: Yes  . Drug Use: Not on file  . Sexual Activity: Not on file   Other Topics Concern  . Not on file   Social History Narrative    Current Outpatient Prescriptions on File Prior to Visit  Medication Sig Dispense Refill  . ACCU-CHEK COMPACT PLUS test strip     . amLODipine (NORVASC) 10 MG tablet     . ARIPiprazole (ABILIFY) 5 MG tablet     . Blood Glucose Monitoring Suppl (ONETOUCH VERIO IQ SYSTEM) W/DEVICE KIT Use to check blood sugar 3 times per day. 1 kit 0  . DULoxetine (CYMBALTA) 60 MG capsule     . fenofibrate 160 MG tablet     . gabapentin (NEURONTIN) 800 MG tablet     . glucose blood (ONETOUCH VERIO) test strip Use to check blood sugar 3 times per day and lancets 3/day. 100 each 4  . HYDROcodone-acetaminophen (NORCO/VICODIN) 5-325 MG per tablet     . Liraglutide (VICTOZA) 18 MG/3ML SOPN Inject 0.3 mLs (1.8 mg total) into the skin daily. 6 mL 2  . lisinopril (PRINIVIL,ZESTRIL) 40 MG tablet     . metFORMIN (GLUCOPHAGE-XR) 500 MG 24 hr tablet Take 4 tablets  (2,000 mg total) by mouth daily with supper. 120 tablet 3  . methocarbamol (ROBAXIN) 500 MG tablet     . metoprolol (LOPRESSOR) 100 MG tablet     . pantoprazole (PROTONIX) 40 MG tablet     . pravastatin (PRAVACHOL) 80 MG tablet     . rOPINIRole (REQUIP) 1 MG tablet     . zolpidem (AMBIEN CR) 6.25 MG CR tablet   0   No current facility-administered medications on file prior to visit.    Allergies  Allergen Reactions  . Penicillins     Family History  Problem Relation Age of Onset  . Diabetes Father     BP 146/92 mmHg  Pulse 87  Temp(Src) 98.2 F (36.8 C) (Oral)  Ht 5' 10"  (1.778 m)  Wt 274 lb (124.286 kg)  BMI 39.32 kg/m2  SpO2 91%  Review of Systems He denies hypoglycemia and weight change.      Objective:   Physical Exam VITAL SIGNS:  See vs page GENERAL: no distress Pulses: dorsalis pedis intact bilat.   Feet: no deformity. 1+ bilat leg edema.  Skin: no ulcer on the feet. both feet are slightly cyanotic, with slow capillary refill. slightly cool to  touch.  Neuro: sensation is intact to touch on the feet,but decreased from normal.    Lab Results  Component Value Date   HGBA1C 6.6* 09/10/2014      Assessment & Plan:  DM: overcontrolled, given this regimen, which does match insulin to his changing needs throughout the day Obesity, persistent  Patient is advised the following: Patient Instructions  blood tests are being requested for you today.  We'll let you know about the results.   check your blood sugar twice a day.  vary the time of day when you check, between before the 3 meals, and at bedtime.  also check if you have symptoms of your blood sugar being too high or too low.  please keep a record of the readings and bring it to your next appointment here.  You can write it on any piece of paper.  please call us sooner if your blood sugar goes below 70, or if you have a lot of readings over 200.  Please continue to pursue the weight loss surgery.     Please come back for a follow-up appointment in 3-4 months.     addendum: reduce the insulin to 200 units each morning.

## 2014-09-11 ENCOUNTER — Telehealth: Payer: Self-pay | Admitting: Endocrinology

## 2014-09-11 LAB — HEMOGLOBIN A1C: Hgb A1c MFr Bld: 6.6 % — ABNORMAL HIGH (ref 4.6–6.5)

## 2014-09-11 MED ORDER — INSULIN GLARGINE 300 UNIT/ML ~~LOC~~ SOPN: 200.0000 [IU] | PEN_INJECTOR | Freq: Every day | SUBCUTANEOUS | Status: DC

## 2014-09-11 NOTE — Telephone Encounter (Signed)
Patients wife called and would like a refill sent to his pharmacy   Rx: Toujeo  [facility]     Please advise patient

## 2014-09-11 NOTE — Telephone Encounter (Signed)
Rx sent to pharmacy per request.  

## 2014-10-17 ENCOUNTER — Other Ambulatory Visit: Payer: Self-pay

## 2014-10-17 MED ORDER — METFORMIN HCL ER 500 MG PO TB24: 2000.0000 mg | ORAL_TABLET | Freq: Every day | ORAL | Status: DC

## 2014-10-23 ENCOUNTER — Other Ambulatory Visit: Payer: Self-pay

## 2014-10-23 MED ORDER — LIRAGLUTIDE 18 MG/3ML ~~LOC~~ SOPN: 1.8000 mg | PEN_INJECTOR | Freq: Every day | SUBCUTANEOUS | Status: DC

## 2014-11-19 ENCOUNTER — Other Ambulatory Visit: Payer: Self-pay | Admitting: Internal Medicine

## 2014-12-04 ENCOUNTER — Other Ambulatory Visit: Payer: Self-pay

## 2014-12-04 MED ORDER — INSULIN GLARGINE 300 UNIT/ML ~~LOC~~ SOPN: 200.0000 [IU] | PEN_INJECTOR | Freq: Every day | SUBCUTANEOUS | Status: DC

## 2014-12-17 ENCOUNTER — Other Ambulatory Visit: Payer: Self-pay

## 2014-12-17 ENCOUNTER — Ambulatory Visit (INDEPENDENT_AMBULATORY_CARE_PROVIDER_SITE_OTHER): Payer: Commercial Managed Care - PPO | Admitting: Endocrinology

## 2014-12-17 ENCOUNTER — Encounter: Payer: Self-pay | Admitting: Endocrinology

## 2014-12-17 VITALS — BP 148/94 | HR 82 | Temp 98.1°F | Ht 70.0 in | Wt 287.0 lb

## 2014-12-17 DIAGNOSIS — E119 Type 2 diabetes mellitus without complications: Secondary | ICD-10-CM | POA: Diagnosis not present

## 2014-12-17 NOTE — Progress Notes (Signed)
Subjective:    Patient ID: Grant Williams, male    DOB: 1955-11-19, 59 y.o.   MRN: [id number]  HPI Pt returns for f/u of diabetes mellitus: Hx is from pt and wife DM type: Insulin-requiring type 2. Dx'ed: 2008.   Complications: painful neuropathy of the lower extremities and CAD.   Therapy: insulin since 2015.   DKA: never.  Severe hypoglycemia: never Pancreatitis: never.    Other: He declines multiple daily injections.   Interval history: no cbg record, but states cbg's vary from 103-270.  There is no trend throughout the day.   No past medical history on file.  No past surgical history on file.  History   Social History  . Marital Status: Married    Spouse Name: N/A  . Number of Children: N/A  . Years of Education: N/A   Occupational History  . Not on file.   Social History Main Topics  . Smoking status: Former Research scientist (life sciences)  . Smokeless tobacco: Not on file     Comment: 30 years ago  . Alcohol Use: Yes  . Drug Use: Not on file  . Sexual Activity: Not on file   Other Topics Concern  . Not on file   Social History Narrative    Current Outpatient Prescriptions on File Prior to Visit  Medication Sig Dispense Refill  . ACCU-CHEK COMPACT PLUS test strip     . amLODipine (NORVASC) 10 MG tablet     . ARIPiprazole (ABILIFY) 5 MG tablet     . Blood Glucose Monitoring Suppl (ONETOUCH VERIO IQ SYSTEM) W/DEVICE KIT Use to check blood sugar 3 times per day. 1 kit 0  . DULoxetine (CYMBALTA) 60 MG capsule     . fenofibrate 160 MG tablet     . gabapentin (NEURONTIN) 800 MG tablet     . glucose blood (ONETOUCH VERIO) test strip Use to check blood sugar 3 times per day and lancets 3/day. 100 each 4  . HYDROcodone-acetaminophen (NORCO/VICODIN) 5-325 MG per tablet     . Insulin Glargine (TOUJEO SOLOSTAR) 300 UNIT/ML SOPN Inject 200 Units into the skin daily. Every morning. 15 pen 2  . Liraglutide (VICTOZA) 18 MG/3ML SOPN Inject 0.3 mLs (1.8 mg total) into the skin daily. 6 mL 2    . lisinopril (PRINIVIL,ZESTRIL) 40 MG tablet     . metFORMIN (GLUCOPHAGE-XR) 500 MG 24 hr tablet Take 4 tablets (2,000 mg total) by mouth daily with supper. 120 tablet 3  . methocarbamol (ROBAXIN) 500 MG tablet     . metoprolol (LOPRESSOR) 100 MG tablet     . pantoprazole (PROTONIX) 40 MG tablet     . pravastatin (PRAVACHOL) 80 MG tablet     . rOPINIRole (REQUIP) 1 MG tablet     . zolpidem (AMBIEN CR) 6.25 MG CR tablet   0   No current facility-administered medications on file prior to visit.    Allergies  Allergen Reactions  . Penicillins     Family History  Problem Relation Age of Onset  . Diabetes Father     BP 148/94 mmHg  Pulse 82  Temp(Src) 98.1 F (36.7 C) (Oral)  Ht 5' 10"  (1.778 m)  Wt 287 lb (130.182 kg)  BMI 41.18 kg/m2  SpO2 95%  Review of Systems He denies hypoglycemia and weight change.      Objective:   Physical Exam VITAL SIGNS:  See vs page GENERAL: no distress Pulses: dorsalis pedis intact bilat.   MSK: no deformity of the  feet CV: 2+ bilat leg edema Skin:  no ulcer on the feet.  normal color and temp on the feet. Neuro: sensation is intact to touch on the feet, but decreased from normal.   Lab Results  Component Value Date   HGBA1C 7.0* 12/17/2014   Lab Results  Component Value Date   MICROALBUR 120.9* 12/17/2014       Assessment & Plan:  DM: well-controlled Obesity: persistent Microalbuminuria, new.    Patient is advised the following: Patient Instructions  blood and urine tests are being requested for you today.  We'll let you know about the results.   check your blood sugar twice a day.  vary the time of day when you check, between before the 3 meals, and at bedtime.  also check if you have symptoms of your blood sugar being too high or too low.  please keep a record of the readings and bring it to your next appointment here.  You can write it on any piece of paper.  please call us sooner if your blood sugar goes below 70, or if  you have a lot of readings over 200.  Please continue to pursue the weight loss surgery.   Please come back for a follow-up appointment in 3 months.     addendum: Please continue the same insulin.  This is reversible. Keeping the blood pressure, sugar and cholesterol in a good range helps.

## 2014-12-17 NOTE — Patient Instructions (Addendum)
blood and urine tests are being requested for you today.  We'll let you know about the results.   check your blood sugar twice a day.  vary the time of day when you check, between before the 3 meals, and at bedtime.  also check if you have symptoms of your blood sugar being too high or too low.  please keep a record of the readings and bring it to your next appointment here.  You can write it on any piece of paper.  please call us sooner if your blood sugar goes below 70, or if you have a lot of readings over 200.  Please continue to pursue the weight loss surgery.   Please come back for a follow-up appointment in 3 months.

## 2014-12-18 LAB — BASIC METABOLIC PANEL
BUN: 14 mg/dL (ref 6–23)
CHLORIDE: 104 meq/L (ref 96–112)
CO2: 24 mEq/L (ref 19–32)
Calcium: 9 mg/dL (ref 8.4–10.5)
Creatinine, Ser: 0.87 mg/dL (ref 0.40–1.50)
GFR: 94.92 mL/min (ref >60.00)
Glucose, Bld: 148 mg/dL — ABNORMAL HIGH (ref 70–99)
POTASSIUM: 3.9 meq/L (ref 3.5–5.1)
Sodium: 138 mEq/L (ref 135–145)

## 2014-12-18 LAB — MICROALBUMIN / CREATININE URINE RATIO
Creatinine,U: 181 mg/dL
MICROALB/CREAT RATIO: 66.8 mg/g — AB (ref 0.0–30.0)
Microalb, Ur: 120.9 mg/dL — ABNORMAL HIGH (ref 0.0–1.9)

## 2014-12-18 LAB — HEMOGLOBIN A1C: Hgb A1c MFr Bld: 7 % — ABNORMAL HIGH (ref 4.6–6.5)

## 2015-01-16 ENCOUNTER — Other Ambulatory Visit: Payer: Self-pay

## 2015-01-16 MED ORDER — LIRAGLUTIDE 18 MG/3ML ~~LOC~~ SOPN: 1.8000 mg | PEN_INJECTOR | Freq: Every day | SUBCUTANEOUS | Status: DC

## 2015-02-06 ENCOUNTER — Other Ambulatory Visit: Payer: Self-pay

## 2015-02-06 MED ORDER — METFORMIN HCL ER 500 MG PO TB24: 2000.0000 mg | ORAL_TABLET | Freq: Every day | ORAL | Status: DC

## 2015-02-13 ENCOUNTER — Other Ambulatory Visit: Payer: Self-pay

## 2015-02-13 MED ORDER — GLUCOSE BLOOD VI STRP: ORAL_STRIP | Status: DC

## 2015-03-08 ENCOUNTER — Other Ambulatory Visit: Payer: Self-pay

## 2015-03-08 MED ORDER — INSULIN GLARGINE 300 UNIT/ML ~~LOC~~ SOPN: 200.0000 [IU] | PEN_INJECTOR | Freq: Every day | SUBCUTANEOUS | Status: DC

## 2015-03-19 ENCOUNTER — Encounter: Payer: Self-pay | Admitting: Endocrinology

## 2015-03-19 ENCOUNTER — Ambulatory Visit (INDEPENDENT_AMBULATORY_CARE_PROVIDER_SITE_OTHER): Payer: Commercial Managed Care - PPO | Admitting: Endocrinology

## 2015-03-19 VITALS — BP 144/87 | HR 93 | Temp 98.7°F | Ht 70.0 in | Wt 288.0 lb

## 2015-03-19 DIAGNOSIS — I10 Essential (primary) hypertension: Secondary | ICD-10-CM

## 2015-03-19 DIAGNOSIS — E119 Type 2 diabetes mellitus without complications: Secondary | ICD-10-CM

## 2015-03-19 DIAGNOSIS — R601 Generalized edema: Secondary | ICD-10-CM | POA: Diagnosis not present

## 2015-03-19 LAB — POCT GLYCOSYLATED HEMOGLOBIN (HGB A1C): HEMOGLOBIN A1C: 7.1

## 2015-03-19 MED ORDER — LISINOPRIL-HYDROCHLOROTHIAZIDE 20-12.5 MG PO TABS: 1.0000 | ORAL_TABLET | Freq: Every day | ORAL | Status: DC

## 2015-03-19 MED ORDER — AMLODIPINE BESYLATE 5 MG PO TABS: 5.0000 mg | ORAL_TABLET | Freq: Every day | ORAL | Status: DC

## 2015-03-19 MED ORDER — LIRAGLUTIDE 18 MG/3ML ~~LOC~~ SOPN: 2.4000 mg | PEN_INJECTOR | Freq: Every day | SUBCUTANEOUS | Status: DC

## 2015-03-19 MED ORDER — INSULIN GLARGINE 300 UNIT/ML ~~LOC~~ SOPN: 170.0000 [IU] | PEN_INJECTOR | Freq: Every day | SUBCUTANEOUS | Status: DC

## 2015-03-19 NOTE — Progress Notes (Signed)
Subjective:    Patient ID: Grant Williams, male    DOB: 09-13-1955, 59 y.o.   MRN: [id number]  HPI Pt returns for f/u of diabetes mellitus: Hx is from pt and wife DM type: Insulin-requiring type 2. Dx'ed: 2008.   Complications: polyneuropathy, nephropathy, and CAD.   Therapy: insulin since 2015 (he also takes victoza and metformin).   DKA: never.  Severe hypoglycemia: never Pancreatitis: never.    Other: He declines multiple daily injections.   Interval history: I asked wife, who says pt continues to gain weight.   He wants to increase the victoza if possible. Otherwise, pt states he feels well in general.  Wife also says edema is worse.  He can't see new pcp for 6 more weeks.   No past medical history on file.  No past surgical history on file.  Social History   Social History  . Marital Status: Married    Spouse Name: N/A  . Number of Children: N/A  . Years of Education: N/A   Occupational History  . Not on file.   Social History Main Topics  . Smoking status: Former Research scientist (life sciences)  . Smokeless tobacco: Not on file     Comment: 30 years ago  . Alcohol Use: Yes  . Drug Use: Not on file  . Sexual Activity: Not on file   Other Topics Concern  . Not on file   Social History Narrative    Current Outpatient Prescriptions on File Prior to Visit  Medication Sig Dispense Refill  . ACCU-CHEK COMPACT PLUS test strip     . ARIPiprazole (ABILIFY) 5 MG tablet     . Blood Glucose Monitoring Suppl (ONETOUCH VERIO IQ SYSTEM) W/DEVICE KIT Use to check blood sugar 3 times per day. 1 kit 0  . DULoxetine (CYMBALTA) 60 MG capsule     . fenofibrate 160 MG tablet     . gabapentin (NEURONTIN) 800 MG tablet     . glucose blood (ONETOUCH VERIO) test strip Use to check blood sugar 3 times per day and lancets 3/day. 100 each 4  . HYDROcodone-acetaminophen (NORCO/VICODIN) 5-325 MG per tablet     . metFORMIN (GLUCOPHAGE-XR) 500 MG 24 hr tablet Take 4 tablets (2,000 mg total) by mouth daily  with supper. 120 tablet 3  . methocarbamol (ROBAXIN) 500 MG tablet     . metoprolol (LOPRESSOR) 100 MG tablet     . pantoprazole (PROTONIX) 40 MG tablet     . pravastatin (PRAVACHOL) 80 MG tablet     . rOPINIRole (REQUIP) 1 MG tablet     . zolpidem (AMBIEN CR) 6.25 MG CR tablet   0   No current facility-administered medications on file prior to visit.    Allergies  Allergen Reactions  . Penicillins     Family History  Problem Relation Age of Onset  . Diabetes Father     BP 144/87 mmHg  Pulse 93  Temp(Src) 98.7 F (37.1 C) (Oral)  Ht 5' 10"  (1.778 m)  Wt 288 lb (130.636 kg)  BMI 41.32 kg/m2  SpO2 95%    Review of Systems he denies hypoglycemia.  Denies sob    Objective:   Physical Exam VITAL SIGNS:  See vs page GENERAL: no distress Pulses: dorsalis pedis intact bilat.   MSK: no deformity of the feet CV: 1+ bilat leg edema Skin:  no ulcer on the feet.  normal color and temp on the feet. Neuro: sensation is intact to touch on the feet, but  decreased from normal    A1c=7.1%    Assessment & Plan:  Edema, due to norvasc, worse.  HTN: he needs increased rx.  Obesity: persistent.  DM: he needs increased rx, if it can be done with a regimen that avoids or minimizes hypoglycemia.    Patient is advised the following: Patient Instructions  blood and urine tests are being requested for you today.  We'll let you know about the results.   check your blood sugar twice a day.  vary the time of day when you check, between before the 3 meals, and at bedtime.  also check if you have symptoms of your blood sugar being too high or too low.  please keep a record of the readings and bring it to your next appointment here.  You can write it on any piece of paper.  please call us sooner if your blood sugar goes below 70, or if you have a lot of readings over 200.  Please continue to pursue the weight loss surgery.   Please come back for a follow-up appointment in 3 months.   i have  sent prescriptions to your pharmacy, to adjust the amlodipine and lisinopril, to help the swelling. i have also sent prescriptions to your pharmacy, to increase the victoza and decrease the insulin.

## 2015-03-19 NOTE — Patient Instructions (Addendum)
blood and urine tests are being requested for you today.  We'll let you know about the results.   check your blood sugar twice a day.  vary the time of day when you check, between before the 3 meals, and at bedtime.  also check if you have symptoms of your blood sugar being too high or too low.  please keep a record of the readings and bring it to your next appointment here.  You can write it on any piece of paper.  please call us sooner if your blood sugar goes below 70, or if you have a lot of readings over 200.  Please continue to pursue the weight loss surgery.   Please come back for a follow-up appointment in 3 months.   i have sent prescriptions to your pharmacy, to adjust the amlodipine and lisinopril, to help the swelling. i have also sent prescriptions to your pharmacy, to increase the victoza and decrease the insulin.

## 2015-03-20 DIAGNOSIS — Z0279 Encounter for issue of other medical certificate: Secondary | ICD-10-CM

## 2015-03-29 ENCOUNTER — Telehealth: Payer: Self-pay | Admitting: Endocrinology

## 2015-03-29 NOTE — Telephone Encounter (Signed)
See note below and please advise, Thanks! 

## 2015-03-29 NOTE — Telephone Encounter (Signed)
Patient stated that since his medication been changed his b/s has been above 200 to 300, please advise

## 2015-03-29 NOTE — Telephone Encounter (Signed)
Pt advised of note below and voiced understanding.  

## 2015-03-29 NOTE — Telephone Encounter (Signed)
Ins declined the increased victoza dosage, so please continue the same. Please increase the insulin to 200 units qam Please call next week to report cbg's

## 2015-04-10 DIAGNOSIS — Z0279 Encounter for issue of other medical certificate: Secondary | ICD-10-CM

## 2015-04-24 DIAGNOSIS — F329 Major depressive disorder, single episode, unspecified: Secondary | ICD-10-CM | POA: Insufficient documentation

## 2015-04-24 DIAGNOSIS — G608 Other hereditary and idiopathic neuropathies: Secondary | ICD-10-CM | POA: Insufficient documentation

## 2015-04-24 DIAGNOSIS — K760 Fatty (change of) liver, not elsewhere classified: Secondary | ICD-10-CM | POA: Insufficient documentation

## 2015-04-25 ENCOUNTER — Telehealth: Payer: Self-pay | Admitting: Endocrinology

## 2015-04-25 NOTE — Telephone Encounter (Signed)
Patient called stating that his blood sugars have been high 200's low 300's for past 3 weeks   Please advise    Thank you

## 2015-04-25 NOTE — Telephone Encounter (Signed)
Please verify insulin is now 200 units qam then increase to 250 units qam. Please call next week to report cbg's

## 2015-04-25 NOTE — Telephone Encounter (Signed)
See note below and please advise, Thanks! 

## 2015-04-25 NOTE — Telephone Encounter (Signed)
Patient advised of note below and voiced understanding.  

## 2015-05-06 ENCOUNTER — Telehealth: Payer: Self-pay | Admitting: Endocrinology

## 2015-05-06 NOTE — Telephone Encounter (Signed)
Patient ask you to return his call

## 2015-05-06 NOTE — Telephone Encounter (Signed)
I contacted the pt. He stated since Thursday in the morning, afternoon and evening is blood sugars have been raging from the 140 to mid 200s. Pt confirmed he is taking 250 units of toujeo each morning, 500 mg metformin 4/dat and 1.8 mg of victoza.  Please advise, Thanks!

## 2015-05-06 NOTE — Telephone Encounter (Signed)
I contacted the pt and left a voicemail advising of note below. Requested a call back if the pt would like to discuss.  

## 2015-05-06 NOTE — Telephone Encounter (Signed)
Please increase toujeo to 275 units qam i'll see you next time.

## 2015-05-23 ENCOUNTER — Other Ambulatory Visit: Payer: Self-pay

## 2015-05-23 MED ORDER — INSULIN PEN NEEDLE 31G X 8 MM MISC: Status: DC

## 2015-05-27 ENCOUNTER — Other Ambulatory Visit: Payer: Self-pay

## 2015-05-27 MED ORDER — METFORMIN HCL ER 500 MG PO TB24: 2000.0000 mg | ORAL_TABLET | Freq: Every day | ORAL | Status: DC

## 2015-05-31 ENCOUNTER — Other Ambulatory Visit: Payer: Self-pay

## 2015-05-31 MED ORDER — INSULIN GLARGINE 300 UNIT/ML ~~LOC~~ SOPN: 170.0000 [IU] | PEN_INJECTOR | Freq: Every day | SUBCUTANEOUS | Status: DC

## 2015-06-05 ENCOUNTER — Telehealth: Payer: Self-pay | Admitting: Endocrinology

## 2015-06-05 MED ORDER — INSULIN GLARGINE 300 UNIT/ML ~~LOC~~ SOPN: 275.0000 [IU] | PEN_INJECTOR | Freq: Every day | SUBCUTANEOUS | Status: DC

## 2015-06-05 NOTE — Telephone Encounter (Signed)
Patient called stating that his pharmacy has messed up on his Rx   Rx:275 units Toujeo  The pharmacy has been only filling 170 units   Please send a new Rx   [facility]   Thank you

## 2015-06-05 NOTE — Telephone Encounter (Signed)
Patient ask you to call him °

## 2015-06-05 NOTE — Telephone Encounter (Signed)
Requested a call back from the pt.  

## 2015-06-05 NOTE — Telephone Encounter (Signed)
Rx sent per pt's request.  

## 2015-06-26 ENCOUNTER — Ambulatory Visit: Payer: Medicare Other | Admitting: Endocrinology

## 2015-07-01 ENCOUNTER — Ambulatory Visit: Payer: Medicare Other | Admitting: Endocrinology

## 2015-07-10 ENCOUNTER — Telehealth: Payer: Self-pay | Admitting: Endocrinology

## 2015-07-10 NOTE — Telephone Encounter (Signed)
Patients wife called and would like to speak with Meagan regarding medications    Please advise    Thank you

## 2015-07-10 NOTE — Telephone Encounter (Signed)
F/u ov is due Here, we treat the blood sugar, but we do not treat the complications of DM (heart, eyes, kidneys, neuropathy) Please seek care for this condition.

## 2015-07-10 NOTE — Telephone Encounter (Signed)
Pt's wife advised of note below and verbalized understanding. Pt scheduled for 07/29/2015.

## 2015-07-10 NOTE — Telephone Encounter (Signed)
Pt's wife called about the pt's hydrocodone rx. She stated the pt was prescribed the medication a few years back due the neuropathy and burring in the feet by his PCP. The pt's PCP has now retired and his new PCP will not prescribe the medication. The pt has been referred to pain management, but before he can be seen the providers with pain management are requiring him to have a MRI of his back. The pt does not want to have the MRI done because the pain is not coming from his back. Pt and pt's wife would like to know if we could start prescribing this medication. Please advise, Thanks!

## 2015-07-29 ENCOUNTER — Ambulatory Visit (INDEPENDENT_AMBULATORY_CARE_PROVIDER_SITE_OTHER): Payer: Commercial Managed Care - PPO | Admitting: Endocrinology

## 2015-07-29 ENCOUNTER — Encounter: Payer: Self-pay | Admitting: Endocrinology

## 2015-07-29 VITALS — BP 152/96 | HR 76 | Temp 98.1°F | Ht 70.0 in | Wt 278.0 lb

## 2015-07-29 DIAGNOSIS — E119 Type 2 diabetes mellitus without complications: Secondary | ICD-10-CM | POA: Diagnosis not present

## 2015-07-29 LAB — POCT GLYCOSYLATED HEMOGLOBIN (HGB A1C): HEMOGLOBIN A1C: 5.9

## 2015-07-29 MED ORDER — INSULIN GLARGINE 300 UNIT/ML ~~LOC~~ SOPN: 250.0000 [IU] | PEN_INJECTOR | Freq: Every day | SUBCUTANEOUS | Status: DC

## 2015-07-29 NOTE — Progress Notes (Signed)
Subjective:    Patient ID: Grant Williams, male    DOB: August 06, 1955, 60 y.o.   MRN: [id number]  HPI Pt returns for f/u of diabetes mellitus: Hx is from pt and wife DM type: Insulin-requiring type 2. Dx'ed: 2008.   Complications: polyneuropathy, nephropathy, and CAD.   Therapy: insulin since 2015 (he also takes victoza and metformin).   DKA: never.  Severe hypoglycemia: never Pancreatitis: never.    Other: He declines multiple daily injections; ins declines weight loss surgery.   Interval history: no cbg record, but states cbg's are mildly low approx once per week.  pt states he otherwise feels well in general.  No past medical history on file.  No past surgical history on file.  Social History   Social History  . Marital Status: Married    Spouse Name: N/A  . Number of Children: N/A  . Years of Education: N/A   Occupational History  . Not on file.   Social History Main Topics  . Smoking status: Former Research scientist (life sciences)  . Smokeless tobacco: Not on file     Comment: 30 years ago  . Alcohol Use: Yes  . Drug Use: Not on file  . Sexual Activity: Not on file   Other Topics Concern  . Not on file   Social History Narrative    Current Outpatient Prescriptions on File Prior to Visit  Medication Sig Dispense Refill  . ACCU-CHEK COMPACT PLUS test strip     . amLODipine (NORVASC) 5 MG tablet Take 1 tablet (5 mg total) by mouth daily. 30 tablet 11  . ARIPiprazole (ABILIFY) 5 MG tablet     . Blood Glucose Monitoring Suppl (ONETOUCH VERIO IQ SYSTEM) W/DEVICE KIT Use to check blood sugar 3 times per day. 1 kit 0  . DULoxetine (CYMBALTA) 60 MG capsule     . fenofibrate 160 MG tablet 160 mg. 4 times daily    . gabapentin (NEURONTIN) 800 MG tablet     . glucose blood (ONETOUCH VERIO) test strip Use to check blood sugar 3 times per day and lancets 3/day. 100 each 4  . Insulin Pen Needle 31G X 8 MM MISC Use to inject 2 times per day 100 each 2  . Liraglutide (VICTOZA) 18 MG/3ML SOPN  Inject 0.4 mLs (2.4 mg total) into the skin daily. 12 mL 11  . lisinopril-hydrochlorothiazide (PRINZIDE,ZESTORETIC) 20-12.5 MG tablet Take 1 tablet by mouth daily. 30 tablet 11  . metFORMIN (GLUCOPHAGE-XR) 500 MG 24 hr tablet Take 4 tablets (2,000 mg total) by mouth daily with supper. 120 tablet 3  . methocarbamol (ROBAXIN) 500 MG tablet 500 mg. 4 times daily    . metoprolol (LOPRESSOR) 100 MG tablet     . pantoprazole (PROTONIX) 40 MG tablet     . pravastatin (PRAVACHOL) 80 MG tablet     . rOPINIRole (REQUIP) 1 MG tablet     . HYDROcodone-acetaminophen (NORCO/VICODIN) 5-325 MG per tablet Reported on 07/29/2015    . zolpidem (AMBIEN CR) 6.25 MG CR tablet Reported on 07/29/2015  0   No current facility-administered medications on file prior to visit.    Allergies  Allergen Reactions  . Penicillins     Family History  Problem Relation Age of Onset  . Diabetes Father     BP 152/96 mmHg  Pulse 76  Temp(Src) 98.1 F (36.7 C) (Oral)  Ht 5' 10" (1.778 m)  Wt 278 lb (126.1 kg)  BMI 39.89 kg/m2  SpO2 96%    Review  of Systems Denies LOC    Objective:   Physical Exam VITAL SIGNS:  See vs page GENERAL: no distress  Pulses: dorsalis pedis intact bilat.  MSK: no deformity of the feet CV: 1+ bilat leg edema Skin: no ulcer on the feet. normal color and temp on the feet. Neuro: sensation is intact to touch on the feet, but decreased from normal.    Lab Results  Component Value Date   HGBA1C 5.9 07/29/2015      Assessment & Plan:  DM: overcontrolled.  Patient is advised the following: Patient Instructions  check your blood sugar twice a day.  vary the time of day when you check, between before the 3 meals, and at bedtime.  also check if you have symptoms of your blood sugar being too high or too low.  please keep a record of the readings and bring it to your next appointment here.  You can write it on any piece of paper.  please call us sooner if your blood sugar goes below  70, or if you have a lot of readings over 200.  Please come back for a follow-up appointment in 3 months.   Please decrease the toujeo to 250 units each morning.   toujeo is similar to "lantus" and "basaglar."  Please let me know if one of these is cheaper, and we'll be happy to change.

## 2015-07-29 NOTE — Patient Instructions (Addendum)
check your blood sugar twice a day.  vary the time of day when you check, between before the 3 meals, and at bedtime.  also check if you have symptoms of your blood sugar being too high or too low.  please keep a record of the readings and bring it to your next appointment here.  You can write it on any piece of paper.  please call us sooner if your blood sugar goes below 70, or if you have a lot of readings over 200.  Please come back for a follow-up appointment in 3 months.   Please decrease the toujeo to 250 units each morning.   toujeo is similar to "lantus" and "basaglar."  Please let me know if one of these is cheaper, and we'll be happy to change.

## 2015-08-29 ENCOUNTER — Other Ambulatory Visit: Payer: Self-pay | Admitting: Endocrinology

## 2015-09-13 DIAGNOSIS — Z7689 Persons encountering health services in other specified circumstances: Secondary | ICD-10-CM

## 2015-09-22 ENCOUNTER — Other Ambulatory Visit: Payer: Self-pay | Admitting: Endocrinology

## 2016-01-20 ENCOUNTER — Other Ambulatory Visit: Payer: Self-pay | Admitting: Endocrinology

## 2016-01-20 NOTE — Telephone Encounter (Signed)
Please refill x 2 mos Ov is due 

## 2016-01-29 DIAGNOSIS — E538 Deficiency of other specified B group vitamins: Secondary | ICD-10-CM | POA: Insufficient documentation

## 2016-02-08 ENCOUNTER — Emergency Department (HOSPITAL_COMMUNITY): Payer: Commercial Managed Care - PPO

## 2016-02-08 ENCOUNTER — Observation Stay (HOSPITAL_COMMUNITY)
Admission: EM | Admit: 2016-02-08 | Discharge: 2016-02-10 | Disposition: A | Discharge status: Home / Self Care | Payer: Commercial Managed Care - PPO | Attending: Family Medicine | Admitting: Family Medicine

## 2016-02-08 ENCOUNTER — Encounter (HOSPITAL_COMMUNITY): Payer: Self-pay

## 2016-02-08 DIAGNOSIS — Z880 Allergy status to penicillin: Secondary | ICD-10-CM | POA: Insufficient documentation

## 2016-02-08 DIAGNOSIS — E785 Hyperlipidemia, unspecified: Secondary | ICD-10-CM | POA: Diagnosis not present

## 2016-02-08 DIAGNOSIS — Z87891 Personal history of nicotine dependence: Secondary | ICD-10-CM | POA: Diagnosis not present

## 2016-02-08 DIAGNOSIS — Z794 Long term (current) use of insulin: Secondary | ICD-10-CM | POA: Diagnosis not present

## 2016-02-08 DIAGNOSIS — Z6841 Body Mass Index (BMI) 40.0 and over, adult: Secondary | ICD-10-CM | POA: Diagnosis not present

## 2016-02-08 DIAGNOSIS — G8929 Other chronic pain: Secondary | ICD-10-CM | POA: Insufficient documentation

## 2016-02-08 DIAGNOSIS — R0600 Dyspnea, unspecified: Secondary | ICD-10-CM | POA: Diagnosis present

## 2016-02-08 DIAGNOSIS — Z7982 Long term (current) use of aspirin: Secondary | ICD-10-CM | POA: Insufficient documentation

## 2016-02-08 DIAGNOSIS — E119 Type 2 diabetes mellitus without complications: Secondary | ICD-10-CM

## 2016-02-08 DIAGNOSIS — M791 Myalgia: Secondary | ICD-10-CM | POA: Diagnosis not present

## 2016-02-08 DIAGNOSIS — I10 Essential (primary) hypertension: Secondary | ICD-10-CM | POA: Diagnosis present

## 2016-02-08 DIAGNOSIS — I110 Hypertensive heart disease with heart failure: Secondary | ICD-10-CM | POA: Insufficient documentation

## 2016-02-08 DIAGNOSIS — I509 Heart failure, unspecified: Secondary | ICD-10-CM | POA: Insufficient documentation

## 2016-02-08 DIAGNOSIS — E662 Morbid (severe) obesity with alveolar hypoventilation: Secondary | ICD-10-CM | POA: Diagnosis not present

## 2016-02-08 DIAGNOSIS — R748 Abnormal levels of other serum enzymes: Secondary | ICD-10-CM | POA: Diagnosis present

## 2016-02-08 DIAGNOSIS — G4733 Obstructive sleep apnea (adult) (pediatric): Secondary | ICD-10-CM | POA: Diagnosis present

## 2016-02-08 DIAGNOSIS — R079 Chest pain, unspecified: Secondary | ICD-10-CM

## 2016-02-08 DIAGNOSIS — R05 Cough: Secondary | ICD-10-CM | POA: Diagnosis present

## 2016-02-08 DIAGNOSIS — M609 Myositis, unspecified: Secondary | ICD-10-CM

## 2016-02-08 DIAGNOSIS — M797 Fibromyalgia: Secondary | ICD-10-CM | POA: Diagnosis present

## 2016-02-08 DIAGNOSIS — I517 Cardiomegaly: Secondary | ICD-10-CM | POA: Diagnosis present

## 2016-02-08 DIAGNOSIS — J9601 Acute respiratory failure with hypoxia: Secondary | ICD-10-CM | POA: Diagnosis present

## 2016-02-08 DIAGNOSIS — R55 Syncope and collapse: Secondary | ICD-10-CM | POA: Diagnosis present

## 2016-02-08 DIAGNOSIS — Z7984 Long term (current) use of oral hypoglycemic drugs: Secondary | ICD-10-CM | POA: Diagnosis not present

## 2016-02-08 DIAGNOSIS — I2510 Atherosclerotic heart disease of native coronary artery without angina pectoris: Secondary | ICD-10-CM | POA: Diagnosis not present

## 2016-02-08 DIAGNOSIS — Z9119 Patient's noncompliance with other medical treatment and regimen: Secondary | ICD-10-CM | POA: Diagnosis not present

## 2016-02-08 DIAGNOSIS — R054 Cough syncope: Secondary | ICD-10-CM | POA: Diagnosis present

## 2016-02-08 DIAGNOSIS — J9621 Acute and chronic respiratory failure with hypoxia: Principal | ICD-10-CM | POA: Insufficient documentation

## 2016-02-08 DIAGNOSIS — E782 Mixed hyperlipidemia: Secondary | ICD-10-CM | POA: Diagnosis present

## 2016-02-08 HISTORY — DX: Type 2 diabetes mellitus without complications: E11.9

## 2016-02-08 HISTORY — DX: Syncope and collapse: R55

## 2016-02-08 HISTORY — DX: Essential (primary) hypertension: I10

## 2016-02-08 HISTORY — DX: Fibromyalgia: M79.7

## 2016-02-08 HISTORY — DX: Cough syncope: R05.4

## 2016-02-08 HISTORY — DX: Cough: R05

## 2016-02-08 LAB — BASIC METABOLIC PANEL
ANION GAP: 7 (ref 5–15)
BUN: 11 mg/dL (ref 6–20)
CALCIUM: 9.3 mg/dL (ref 8.9–10.3)
CO2: 23 mmol/L (ref 22–32)
Chloride: 105 mmol/L (ref 101–111)
Creatinine, Ser: 0.99 mg/dL (ref 0.61–1.24)
GLUCOSE: 121 mg/dL — AB (ref 65–99)
POTASSIUM: 4.3 mmol/L (ref 3.5–5.1)
Sodium: 135 mmol/L (ref 135–145)

## 2016-02-08 LAB — GLUCOSE, CAPILLARY: Glucose-Capillary: 177 mg/dL — ABNORMAL HIGH (ref 65–99)

## 2016-02-08 LAB — CBG MONITORING, ED
GLUCOSE-CAPILLARY: 93 mg/dL (ref 65–99)
Glucose-Capillary: 53 mg/dL — ABNORMAL LOW (ref 65–99)

## 2016-02-08 LAB — TSH: TSH: 0.399 u[IU]/mL (ref 0.350–4.500)

## 2016-02-08 LAB — I-STAT ARTERIAL BLOOD GAS, ED
Acid-base deficit: 1 mmol/L (ref 0.0–2.0)
BICARBONATE: 24.9 meq/L — AB (ref 20.0–24.0)
O2 Saturation: 91 %
PCO2 ART: 43.9 mmHg (ref 35.0–45.0)
TCO2: 26 mmol/L (ref 0–100)
pH, Arterial: 7.361 (ref 7.350–7.450)
pO2, Arterial: 63 mmHg — ABNORMAL LOW (ref 80.0–100.0)

## 2016-02-08 LAB — HEPATIC FUNCTION PANEL
ALBUMIN: 3.5 g/dL (ref 3.5–5.0)
ALK PHOS: 31 U/L — AB (ref 38–126)
ALT: 50 U/L (ref 17–63)
AST: 60 U/L — AB (ref 15–41)
BILIRUBIN DIRECT: 0.4 mg/dL (ref 0.1–0.5)
BILIRUBIN TOTAL: 0.8 mg/dL (ref 0.3–1.2)
Indirect Bilirubin: 0.4 mg/dL (ref 0.3–0.9)
Total Protein: 5.5 g/dL — ABNORMAL LOW (ref 6.5–8.1)

## 2016-02-08 LAB — CK TOTAL AND CKMB (NOT AT ARMC)
CK TOTAL: 1592 U/L — AB (ref 49–397)
CK, MB: 9.4 ng/mL — AB (ref 0.5–5.0)
RELATIVE INDEX: 0.6 (ref 0.0–2.5)

## 2016-02-08 LAB — BRAIN NATRIURETIC PEPTIDE: B Natriuretic Peptide: 12.5 pg/mL (ref 0.0–100.0)

## 2016-02-08 LAB — TROPONIN I
Troponin I: <0.03 ng/mL (ref <0.03)
Troponin I: <0.03 ng/mL (ref <0.03)

## 2016-02-08 LAB — CBC
HEMATOCRIT: 49.4 % (ref 39.0–52.0)
HEMOGLOBIN: 15.9 g/dL (ref 13.0–17.0)
MCH: 27.9 pg (ref 26.0–34.0)
MCHC: 32.2 g/dL (ref 30.0–36.0)
MCV: 86.7 fL (ref 78.0–100.0)
Platelets: 154 10*3/uL (ref 150–400)
RBC: 5.7 MIL/uL (ref 4.22–5.81)
RDW: 14.2 % (ref 11.5–15.5)
WBC: 5.2 10*3/uL (ref 4.0–10.5)

## 2016-02-08 LAB — LACTIC ACID, PLASMA: LACTIC ACID, VENOUS: 0.9 mmol/L (ref 0.5–1.9)

## 2016-02-08 LAB — D-DIMER, QUANTITATIVE (NOT AT ARMC): D DIMER QUANT: 0.42 ug{FEU}/mL (ref 0.00–0.50)

## 2016-02-08 LAB — I-STAT TROPONIN, ED: TROPONIN I, POC: 0 ng/mL (ref 0.00–0.08)

## 2016-02-08 MED ORDER — ARIPIPRAZOLE 5 MG PO TABS
5.0000 mg | ORAL_TABLET | Freq: Every day | ORAL | Status: DC
  Administered 2016-02-09 – 2016-02-10 (×2): 5 mg via ORAL
  Filled 2016-02-08 (×3): qty 1

## 2016-02-08 MED ORDER — GLIMEPIRIDE 4 MG PO TABS
4.0000 mg | ORAL_TABLET | Freq: Every day | ORAL | Status: DC
  Filled 2016-02-08: qty 1

## 2016-02-08 MED ORDER — PANTOPRAZOLE SODIUM 40 MG PO TBEC
40.0000 mg | DELAYED_RELEASE_TABLET | Freq: Two times a day (BID) | ORAL | Status: DC
Start: 2016-02-08 — End: 2016-02-10
  Administered 2016-02-08 – 2016-02-10 (×4): 40 mg via ORAL
  Filled 2016-02-08 (×5): qty 1

## 2016-02-08 MED ORDER — FENOFIBRATE 160 MG PO TABS
160.0000 mg | ORAL_TABLET | Freq: Every day | ORAL | Status: DC
  Administered 2016-02-09 – 2016-02-10 (×2): 160 mg via ORAL
  Filled 2016-02-08 (×3): qty 1

## 2016-02-08 MED ORDER — GABAPENTIN 400 MG PO CAPS
800.0000 mg | ORAL_CAPSULE | Freq: Four times a day (QID) | ORAL | Status: DC
  Administered 2016-02-08 – 2016-02-10 (×6): 800 mg via ORAL
  Filled 2016-02-08 (×6): qty 2

## 2016-02-08 MED ORDER — SODIUM CHLORIDE 0.9 % IV BOLUS (SEPSIS)
500.0000 mL | Freq: Once | INTRAVENOUS | Status: AC
Start: 2016-02-08 — End: 2016-02-08
  Administered 2016-02-08: 500 mL via INTRAVENOUS

## 2016-02-08 MED ORDER — METHOCARBAMOL 500 MG PO TABS
500.0000 mg | ORAL_TABLET | Freq: Every day | ORAL | Status: DC
  Administered 2016-02-09: 500 mg via ORAL
  Filled 2016-02-08: qty 1

## 2016-02-08 MED ORDER — INSULIN ASPART 100 UNIT/ML ~~LOC~~ SOLN
0.0000 [IU] | Freq: Three times a day (TID) | SUBCUTANEOUS | Status: DC
  Administered 2016-02-09: 2 [IU] via SUBCUTANEOUS
  Administered 2016-02-09: 1 [IU] via SUBCUTANEOUS
  Administered 2016-02-09: 2 [IU] via SUBCUTANEOUS

## 2016-02-08 MED ORDER — IPRATROPIUM-ALBUTEROL 0.5-2.5 (3) MG/3ML IN SOLN
3.0000 mL | Freq: Once | RESPIRATORY_TRACT | Status: AC
  Administered 2016-02-08: 3 mL via RESPIRATORY_TRACT
  Filled 2016-02-08: qty 3

## 2016-02-08 MED ORDER — INSULIN GLARGINE 300 UNIT/ML ~~LOC~~ SOPN: 250.0000 [IU] | PEN_INJECTOR | Freq: Every day | SUBCUTANEOUS | Status: DC

## 2016-02-08 MED ORDER — DULOXETINE HCL 60 MG PO CPEP
60.0000 mg | ORAL_CAPSULE | Freq: Two times a day (BID) | ORAL | Status: DC
  Administered 2016-02-08 – 2016-02-10 (×4): 60 mg via ORAL
  Filled 2016-02-08 (×5): qty 1

## 2016-02-08 MED ORDER — ACETAMINOPHEN 325 MG PO TABS: 650.0000 mg | ORAL_TABLET | ORAL | Status: DC | PRN

## 2016-02-08 MED ORDER — METHOCARBAMOL 500 MG PO TABS
1000.0000 mg | ORAL_TABLET | Freq: Every day | ORAL | Status: DC
  Administered 2016-02-08 – 2016-02-09 (×2): 1000 mg via ORAL
  Filled 2016-02-08 (×2): qty 2

## 2016-02-08 MED ORDER — PREGABALIN 75 MG PO CAPS
150.0000 mg | ORAL_CAPSULE | Freq: Three times a day (TID) | ORAL | Status: DC
  Administered 2016-02-08 – 2016-02-10 (×4): 150 mg via ORAL
  Filled 2016-02-08 (×4): qty 2

## 2016-02-08 MED ORDER — ROPINIROLE HCL 1 MG PO TABS
1.0000 mg | ORAL_TABLET | Freq: Every day | ORAL | Status: DC
  Administered 2016-02-08 – 2016-02-09 (×2): 1 mg via ORAL
  Filled 2016-02-08 (×3): qty 1

## 2016-02-08 MED ORDER — METHOCARBAMOL 500 MG PO TABS: 500.0000 mg | ORAL_TABLET | ORAL | Status: DC

## 2016-02-08 MED ORDER — ASPIRIN 325 MG PO TABS
325.0000 mg | ORAL_TABLET | Freq: Every day | ORAL | Status: DC
  Administered 2016-02-09 – 2016-02-10 (×2): 325 mg via ORAL
  Filled 2016-02-08 (×3): qty 1

## 2016-02-08 MED ORDER — LIRAGLUTIDE 18 MG/3ML ~~LOC~~ SOPN: 1.8000 mg | PEN_INJECTOR | Freq: Every day | SUBCUTANEOUS | Status: DC

## 2016-02-08 MED ORDER — GI COCKTAIL ~~LOC~~: 30.0000 mL | Freq: Four times a day (QID) | ORAL | Status: DC | PRN

## 2016-02-08 MED ORDER — ENOXAPARIN SODIUM 40 MG/0.4ML ~~LOC~~ SOLN
40.0000 mg | SUBCUTANEOUS | Status: DC
  Administered 2016-02-08 – 2016-02-09 (×2): 40 mg via SUBCUTANEOUS
  Filled 2016-02-08 (×2): qty 0.4

## 2016-02-08 MED ORDER — SODIUM CHLORIDE 0.9 % IV SOLN
INTRAVENOUS | Status: DC
  Administered 2016-02-08 – 2016-02-09 (×2): via INTRAVENOUS

## 2016-02-08 MED ORDER — GABAPENTIN 800 MG PO TABS
800.0000 mg | ORAL_TABLET | Freq: Four times a day (QID) | ORAL | Status: DC
  Filled 2016-02-08: qty 1

## 2016-02-08 MED ORDER — INSULIN ASPART 100 UNIT/ML ~~LOC~~ SOLN
0.0000 [IU] | Freq: Every day | SUBCUTANEOUS | Status: DC
  Administered 2016-02-09: 2 [IU] via SUBCUTANEOUS

## 2016-02-08 MED ORDER — ONDANSETRON HCL 4 MG/2ML IJ SOLN: 4.0000 mg | Freq: Four times a day (QID) | INTRAMUSCULAR | Status: DC | PRN

## 2016-02-08 MED ORDER — DEXTROSE 50 % IV SOLN
INTRAVENOUS | Status: AC
  Administered 2016-02-08: 50 mL
  Filled 2016-02-08: qty 50

## 2016-02-08 MED ORDER — MORPHINE SULFATE (PF) 2 MG/ML IV SOLN: 2.0000 mg | INTRAVENOUS | Status: DC | PRN

## 2016-02-08 MED ORDER — METOPROLOL TARTRATE 25 MG PO TABS
25.0000 mg | ORAL_TABLET | Freq: Two times a day (BID) | ORAL | Status: DC
  Administered 2016-02-08 – 2016-02-10 (×4): 25 mg via ORAL
  Filled 2016-02-08 (×5): qty 1

## 2016-02-08 MED ORDER — METHOCARBAMOL 500 MG PO TABS
500.0000 mg | ORAL_TABLET | Freq: Every day | ORAL | Status: DC
  Administered 2016-02-09 – 2016-02-10 (×2): 500 mg via ORAL
  Filled 2016-02-08 (×3): qty 1

## 2016-02-08 NOTE — ED Notes (Signed)
CBG 93 

## 2016-02-08 NOTE — ED Notes (Signed)
Pt is in radiology.

## 2016-02-08 NOTE — ED Triage Notes (Signed)
Grant Williams, @ [facility] called pt this morning to come to ED.  Abnormal cardiac labs.  Care everywhere CK 1630, CK-MB 9.5.  No chest pain at this time.  C/o shortness of breath.  Talking in complete sentences, NAD at triage.

## 2016-02-08 NOTE — ED Notes (Signed)
Attempted Report 

## 2016-02-08 NOTE — ED Notes (Signed)
Pt alert and oriented sitting up. Reports he feels better.

## 2016-02-08 NOTE — ED Notes (Signed)
Pt noted to be hypotensive compared to prior BP readings at 99/59, repeat showing 100/58, also noted to be 86% on RA, placed on 2L, readings at 92% at this time. Increased to 3L. PA aware and recommended giving 500 cc bolus NS.

## 2016-02-08 NOTE — ED Notes (Signed)
CBG 53 

## 2016-02-08 NOTE — ED Provider Notes (Signed)
[facility] DEPT Provider Note   CSN: [id number] Arrival date & time: 02/08/16  1102     History   Chief Complaint Chief Complaint  Patient presents with  . Abnormal Lab  . Shortness of Breath    HPI Grant Williams is a 60 y.o. male.  HPI Grant Williams is a 60 y.o. male with history of hypertension, diabetes, presents to emergency department complaining of abnormal lab work from family practice office. Patient states he had his regular visit to his primary care doctor, when he mentioned to them he's been having few episodes of chest pain. Blood work was obtained, and he was called today to calm to the ED due to elevated CK-MB. Patient states he had several episodes of retrosternal sharp chest pain. Pain is not exertional. Reports some shortness of breath, that is also intermittent, at rest and on exertion. Denies cough or congestion. No fever or chills. Reports cardiac workup several years ago, when he was told he had 60% blockage in one of his arteries. He is currently being treated medically with aspirin. Chest pain free at this time.  Past Medical History:  Diagnosis Date  . Cough syncope   . Diabetes mellitus without complication ([place])   . Fibromyalgia   . Hypertension     Patient Active Problem List   Diagnosis Date Noted  . Diabetes ([place]) 03/06/2014  . CAD, NATIVE VESSEL 08/12/2009  . DIZZINESS 08/12/2009  . CHEST PAIN-PRECORDIAL 08/12/2009  . HYPERLIPIDEMIA 08/07/2009  . HYPERTENSION 08/07/2009  . DIVERTICULOSIS, COLON 08/07/2009  . SLEEP APNEA 08/07/2009  . WEAKNESS 08/07/2009  . SWEATING 08/07/2009  . RENAL CALCULUS, HX OF 08/07/2009  . HEADACHES, HX OF 08/07/2009    Past Surgical History:  Procedure Laterality Date  . ELBOW SURGERY Right   . HERNIA REPAIR    . SHOULDER ARTHROSCOPY Right        Home Medications    Prior to Admission medications   Medication Sig Start Date End Date Taking? Authorizing Provider  ACCU-CHEK COMPACT PLUS test  strip  02/26/14   Historical Provider, MD  amLODipine (NORVASC) 5 MG tablet Take 1 tablet (5 mg total) by mouth daily. 03/19/15   Renato Shin, MD  ARIPiprazole (ABILIFY) 5 MG tablet  02/11/14   Historical Provider, MD  Blood Glucose Monitoring Suppl (ONETOUCH VERIO IQ SYSTEM) W/DEVICE KIT Use to check blood sugar 3 times per day. 07/03/14   Renato Shin, MD  DULoxetine (CYMBALTA) 60 MG capsule  02/21/14   Historical Provider, MD  fenofibrate 160 MG tablet 160 mg. 4 times daily 02/23/14   Historical Provider, MD  gabapentin (NEURONTIN) 800 MG tablet  02/06/14   Historical Provider, MD  HYDROcodone-acetaminophen (NORCO/VICODIN) 5-325 MG per tablet Reported on 07/29/2015 02/12/14   Historical Provider, MD  Insulin Glargine (TOUJEO SOLOSTAR) 300 UNIT/ML SOPN Inject 250 Units into the skin daily. Every morning. 07/29/15   Renato Shin, MD  Insulin Pen Needle 31G X 8 MM MISC Use to inject 2 times per day 05/23/15   Renato Shin, MD  Liraglutide (VICTOZA) 18 MG/3ML SOPN Inject 0.4 mLs (2.4 mg total) into the skin daily. 03/19/15   Renato Shin, MD  lisinopril-hydrochlorothiazide (PRINZIDE,ZESTORETIC) 20-12.5 MG tablet Take 1 tablet by mouth daily. 03/19/15   Renato Shin, MD  metFORMIN (GLUCOPHAGE-XR) 500 MG 24 hr tablet take 4 tablets by mouth once daily WITH SUPPER 01/21/16   Renato Shin, MD  methocarbamol (ROBAXIN) 500 MG tablet 500 mg. 4 times daily 03/03/14   Historical  Provider, MD  metoprolol (LOPRESSOR) 100 MG tablet  02/02/14   Historical Provider, MD  Glory Rosebush VERIO test strip use TO CHECK BLOOD SUGAR three times a day 08/29/15   Renato Shin, MD  pantoprazole (PROTONIX) 40 MG tablet  02/19/14   Historical Provider, MD  pravastatin (PRAVACHOL) 80 MG tablet  02/19/14   Historical Provider, MD  rOPINIRole (REQUIP) 1 MG tablet  03/03/14   Historical Provider, MD  zolpidem (AMBIEN CR) 6.25 MG CR tablet Reported on 07/29/2015 06/08/14   Historical Provider, MD    Family History Family History  Problem Relation Age of  Onset  . Diabetes Father     Social History Social History  Substance Use Topics  . Smoking status: Former Research scientist (life sciences)  . Smokeless tobacco: Current User    Types: Chew     Comment: 30 years ago  . Alcohol use No     Allergies   Penicillins   Review of Systems Review of Systems  Constitutional: Negative for chills and fever.  Respiratory: Positive for shortness of breath. Negative for cough and chest tightness.   Cardiovascular: Positive for chest pain. Negative for palpitations and leg swelling.  Gastrointestinal: Negative for abdominal distention, abdominal pain, diarrhea, nausea and vomiting.  Genitourinary: Negative for dysuria, frequency, hematuria and urgency.  Musculoskeletal: Negative for arthralgias, myalgias, neck pain and neck stiffness.  Skin: Negative for rash.  Allergic/Immunologic: Negative for immunocompromised state.  Neurological: Negative for dizziness, weakness, light-headedness, numbness and headaches.  All other systems reviewed and are negative.    Physical Exam Updated Vital Signs BP 126/80   Pulse 73   Temp 97.6 F (36.4 C) (Oral)   Resp 18   Ht 5' 9"  (1.753 m)   Wt 132.6 kg   SpO2 98%   BMI 43.17 kg/m   Physical Exam  Constitutional: He appears well-developed and well-nourished. No distress.  HENT:  Head: Normocephalic and atraumatic.  Eyes: Conjunctivae are normal.  Neck: Neck supple.  Cardiovascular: Normal rate, regular rhythm and normal heart sounds.   Pulmonary/Chest: Effort normal. No respiratory distress. He has no wheezes. He has no rales.  Abdominal: Soft. Bowel sounds are normal. He exhibits no distension. There is no tenderness. There is no rebound.  Musculoskeletal: He exhibits no edema.  Neurological: He is alert.  Skin: Skin is warm and dry.  Nursing note and vitals reviewed.    ED Treatments / Results  Labs (all labs ordered are listed, but only abnormal results are displayed) Labs Reviewed  BASIC METABOLIC PANEL -  Abnormal; Notable for the following:       Result Value   Glucose, Bld 121 (*)    All other components within normal limits  CK TOTAL AND CKMB (NOT AT [facility]) - Abnormal; Notable for the following:    Total CK 1,592 (*)    CK, MB 9.4 (*)    All other components within normal limits  HEPATIC FUNCTION PANEL - Abnormal; Notable for the following:    Total Protein 5.5 (*)    AST 60 (*)    Alkaline Phosphatase 31 (*)    All other components within normal limits  HEMOGLOBIN A1C - Abnormal; Notable for the following:    Hgb A1c MFr Bld 8.3 (*)    All other components within normal limits  CK - Abnormal; Notable for the following:    Total CK 1,004 (*)    All other components within normal limits  GLUCOSE, CAPILLARY - Abnormal; Notable for the following:  Glucose-Capillary 177 (*)    All other components within normal limits  GLUCOSE, CAPILLARY - Abnormal; Notable for the following:    Glucose-Capillary 153 (*)    All other components within normal limits  GLUCOSE, CAPILLARY - Abnormal; Notable for the following:    Glucose-Capillary 150 (*)    All other components within normal limits  GLUCOSE, CAPILLARY - Abnormal; Notable for the following:    Glucose-Capillary 169 (*)    All other components within normal limits  CBC - Abnormal; Notable for the following:    Platelets 148 (*)    All other components within normal limits  COMPREHENSIVE METABOLIC PANEL - Abnormal; Notable for the following:    Glucose, Bld 113 (*)    Calcium 8.5 (*)    Total Protein 6.0 (*)    AST 44 (*)    Alkaline Phosphatase 37 (*)    All other components within normal limits  CK - Abnormal; Notable for the following:    Total CK 612 (*)    All other components within normal limits  GLUCOSE, CAPILLARY - Abnormal; Notable for the following:    Glucose-Capillary 210 (*)    All other components within normal limits  GLUCOSE, CAPILLARY - Abnormal; Notable for the following:    Glucose-Capillary 127 (*)     All other components within normal limits  CBG MONITORING, ED - Abnormal; Notable for the following:    Glucose-Capillary 53 (*)    All other components within normal limits  I-STAT ARTERIAL BLOOD GAS, ED - Abnormal; Notable for the following:    pO2, Arterial 63.0 (*)    Bicarbonate 24.9 (*)    All other components within normal limits  CBC  D-DIMER, QUANTITATIVE (NOT AT [facility])  TROPONIN I  BRAIN NATRIURETIC PEPTIDE  LACTIC ACID, PLASMA  TSH  T4, FREE  TROPONIN I  TROPONIN I  TROPONIN I  BLOOD GAS, ARTERIAL  VITAMIN D 25 HYDROXY (VIT D DEFICIENCY, FRACTURES)  I-STAT TROPOININ, ED  CBG MONITORING, ED    EKG  EKG Interpretation  Date/Time:  Saturday February 08 2016 11:39:08 EDT Ventricular Rate:  73 PR Interval:  196 QRS Duration: 86 QT Interval:  372 QTC Calculation: 409 R Axis:   4 Text Interpretation:  Normal sinus rhythm Inferior infarct , age undetermined Possible Anterior infarct , age undetermined Abnormal ECG No significant change since last tracing Confirmed by [facility] MD, [place] ([id number]) on 02/08/2016 1:04:13 PM       Radiology Dg Chest 2 View  Result Date: 02/08/2016 CLINICAL DATA:  Pt reports increased SOB with intermittent left side CP x 2.5 months; he reports abnormal labs and his Dr's office sent him here; he reports h/o DM, HTN, and cardiac cath; former smoker EXAM: CHEST  2 VIEW COMPARISON:  09/22/2005 FINDINGS: Cardiac silhouette is normal in size. The aorta is uncoiled and tortuous. No mediastinal or hilar masses or evidence of adenopathy. Clear lungs.  No pleural effusion or pneumothorax. Skeletal structures are demineralized intact. IMPRESSION: No active cardiopulmonary disease. Electronically Signed   By: Lajean Manes M.D.   On: 02/08/2016 12:22    Procedures Procedures (including critical care time)  Medications Ordered in ED Medications - No data to display   Initial Impression / Assessment and Plan / ED Course  I have reviewed the triage  vital signs and the nursing notes.  Pertinent labs & imaging results that were available during my care of the patient were reviewed by me and considered in my medical  decision making (see chart for details).  Clinical Course  Comment By Time  Patient seen and examined. Patient presents to the emergency department due to elevated CK-MB level. Blood work was obtained yesterday by his primary care doctor. He is currently chest pain-free but reports several episodes of chest pain that occurred with the last few weeks. Will repeat CK-MB, CBC, basic metabolic panel, troponin. EKG obtained. Vital signs are normal, patient is in no distress at this time. Had 355m of aspirin at this time.  TJeannett Senior PA-C 08/19 1228  Patient's CK-MB elevated, troponin negative. Spoke with cardiologist, who did not think this is a heart related issues TJeannett Senior PA-C 08/19 1453    Pt with elevated CK-MB over the last week here with shortness of breath. Work up resulted in elevated CK-mb level here. Neg trop. Neg cxr. Pt with significant sob even walking to the bathroom in ED. Desated into mid 852son RA. Placed on 2L [place]. Hx of moderate CAD on cath 5 years ago. Will admit to medicine for further evaluation.   Final Clinical Impressions(s) / ED Diagnoses   Final diagnoses:  Dyspnea    New Prescriptions New Prescriptions   No medications on file     TJeannett Senior PA-C 02/10/16 1[place] MD 02/13/16 1335

## 2016-02-08 NOTE — H&P (Signed)
History and Physical    Grant Williams VOZ:[id number] DOB: [birth year] DOA: 02/08/2016   PCP: Vicenta Aly, FNP / UNASSIGNED  Patient coming from/Resides with: Private residence/lids with wife  Chief Complaint: Sent from M.D. office for abnormal labs; reporting shortness of breath and chest pain  HPI: Grant Williams is a 60 y.o. male with medical history significant for obesity, no nonobstructive CAD based on cath in 2012, obstructive sleep apnea noncompliant with CPAP, fibromyalgia, hypertension with left ventricular hypertrophy, diabetes on multiple agents including insulin, dyslipidemia and history of cough syncope. Patient reports that in the past few weeks his lipid agents have been adjusted by his physician. Initially his fenofibrate was discontinued but then quickly resumed. One week ago his pravastatin was discontinued secondary to abnormal labs. Patient went to his PCP yesterday to have repeat CK level drawn and this was elevated at greater than 1600 so PCP sent him to ER for further evaluation.  In talking with the patient he has had several complaints not specifically related to this abnormal lab. He has a history of chronic insomnia since childhood. He has been intolerant to CPAP. He has had progressive ongoing dyspnea for 2 months. He has gained 20 pounds since starting Lyrica several months ago. He has been having nonspecific sharp then pressure-like left anterior chest pain that does not radiate and has no associated symptoms intermittently for the past month. He has episodes of excessive unexplained fatigue. He has had no change in his fibromyalgia and chronic pain syndrome. While in the ER with ambulation he was noted to desaturate to 86% on room air.  ED Course:  Vital signs: 97.6-126/80-73-18-at rest room air saturations 92-98% Repeat vital signs: RN documented suboptimal blood pressure readings with systolic down to 99 with ambulatory pulse oximetry down to 86%-orders  received from ERP A to give 500 mL bolus 2 view chest x-ray: No active cardiopulmonary disease Lab data: Sodium 135, potassium 4.3, CO2 23, BUN 11, creatinine 0.99, glucose 121, anion gap 7, total CK 1592, troponin less than 0.03, white count 5200 pharyngeal not obtained, hemoglobin 15.9, platelets 151,000, d-dimer 0.42 Medications and treatments: D50 1 amp 1, normal saline 5 mL bolus 1 DuoNeb 1  Review of Systems:  In addition to the HPI above,  No Fever-chills or change/increase in chronic myalgias or other constitutional symptoms No Headache, changes with Vision or hearing, new weakness, tingling, numbness in any extremity, No problems swallowing food or Liquids, indigestion/reflux No Cough, palpitations, orthopnea No Abdominal pain, N/V; no melena or hematochezia, no dark tarry stools No dysuria, hematuria or flank pain No new skin rashes, lesions, masses or bruises, No new joints pains-aches No recent weight loss No polyuria, polydypsia or polyphagia,   Past Medical History:  Diagnosis Date  . Cough syncope   . Diabetes mellitus without complication ([place])   . Fibromyalgia   . Hypertension     Past Surgical History:  Procedure Laterality Date  . ELBOW SURGERY Right   . HERNIA REPAIR    . SHOULDER ARTHROSCOPY Right     Social History   Social History  . Marital status: Married    Spouse name: N/A  . Number of children: N/A  . Years of education: N/A   Occupational History  . Not on file.   Social History Main Topics  . Smoking status: Former Research scientist (life sciences)  . Smokeless tobacco: Current User    Types: Chew     Comment: 30 years ago  . Alcohol use No  .  Drug use: No  . Sexual activity: Not on file   Other Topics Concern  . Not on file   Social History Narrative  . No narrative on file    Mobility: On the rare occasion requires a cane Work history: Disabled; former Arts administrator   Allergies  Allergen Reactions  . Penicillins Other (See Comments)    Unknown  childhood allergic reaction - no other information available    Family History  Problem Relation Age of Onset  . Diabetes Father      Prior to Admission medications   Medication Sig Start Date End Date Taking? Authorizing Provider  amLODipine (NORVASC) 10 MG tablet Take 10 mg by mouth daily.   Yes Historical Provider, MD  ARIPiprazole (ABILIFY) 5 MG tablet Take 5 mg by mouth daily.  02/11/14  Yes Historical Provider, MD  aspirin 325 MG tablet Take 325 mg by mouth daily.   Yes Historical Provider, MD  DULoxetine (CYMBALTA) 60 MG capsule Take 60 mg by mouth 2 (two) times daily.  02/21/14  Yes Historical Provider, MD  fenofibrate 160 MG tablet Take 160 mg by mouth daily.  02/23/14  Yes Historical Provider, MD  gabapentin (NEURONTIN) 800 MG tablet Take 800 mg by mouth every 6 (six) hours.  02/06/14  Yes Historical Provider, MD  glimepiride (AMARYL) 4 MG tablet Take 4 mg by mouth daily.   Yes Historical Provider, MD  Insulin Glargine (TOUJEO SOLOSTAR) 300 UNIT/ML SOPN Inject 250 Units into the skin daily. Every morning. Patient taking differently: Inject 250 Units into the skin daily before breakfast.  07/29/15  Yes Renato Shin, MD  Liraglutide (VICTOZA) 18 MG/3ML SOPN Inject 0.4 mLs (2.4 mg total) into the skin daily. Patient taking differently: Inject 1.8 mg into the skin daily before breakfast.  03/19/15  Yes Renato Shin, MD  lisinopril-hydrochlorothiazide (PRINZIDE,ZESTORETIC) 20-12.5 MG tablet Take 1 tablet by mouth daily. 03/19/15  Yes Renato Shin, MD  metFORMIN (GLUCOPHAGE-XR) 500 MG 24 hr tablet take 4 tablets by mouth once daily WITH SUPPER Patient taking differently: take 4 tablets by mouth once daily with supper 01/21/16  Yes Renato Shin, MD  methocarbamol (ROBAXIN) 500 MG tablet Take 500-1,000 mg by mouth See admin instructions. Take 1 tablet (500 mg) by mouth every morning, take 1 tablet 8 hours later and then 2 tablets 8 hours after 2nd dose 03/03/14  Yes Historical Provider, MD  metoprolol  (LOPRESSOR) 100 MG tablet Take 100 mg by mouth daily.  02/02/14  Yes Historical Provider, MD  naproxen sodium (ALEVE) 220 MG tablet Take 660 mg by mouth daily as needed (pain).   Yes Historical Provider, MD  pantoprazole (PROTONIX) 40 MG tablet Take 40 mg by mouth 2 (two) times daily.  02/19/14  Yes Historical Provider, MD  pregabalin (LYRICA) 150 MG capsule Take 150 mg by mouth 3 (three) times daily.   Yes Historical Provider, MD  rOPINIRole (REQUIP) 1 MG tablet Take 1 mg by mouth at bedtime.  03/03/14  Yes Historical Provider, MD  spironolactone (ALDACTONE) 25 MG tablet Take 25 mg by mouth daily.   Yes Historical Provider, MD  ACCU-CHEK COMPACT PLUS test strip  02/26/14   Historical Provider, MD  amLODipine (NORVASC) 5 MG tablet Take 1 tablet (5 mg total) by mouth daily. Patient not taking: Reported on 02/08/2016 03/19/15   Renato Shin, MD  Blood Glucose Monitoring Suppl (ONETOUCH VERIO IQ SYSTEM) W/DEVICE KIT Use to check blood sugar 3 times per day. 07/03/14   Renato Shin, MD  Insulin Pen Needle 31G X 8 MM MISC Use to inject 2 times per day 05/23/15   Renato Shin, MD  [facility] VERIO test strip use TO CHECK BLOOD SUGAR three times a day 08/29/15   Renato Shin, MD    Physical Exam: Vitals:   02/08/16 1402 02/08/16 1415 02/08/16 1430 02/08/16 1445  BP: 100/58 113/67 133/98 115/73  Pulse: 65 64 72 62  Resp: _0 Temp:      TempSrc:      SpO2: 90% 95% 96% 96%  Weight:      Height:          Constitutional: NAD, calm, comfortable Eyes: PERRL, lids and conjunctivae normal ENMT: Mucous membranes are moist. Posterior pharynx clear of any exudate or lesions.Normal dentition.  Neck: normal, supple, no masses, no thyromegaly Respiratory: Coarse but otherwise clear to auscultation bilaterally, no wheezing, no crackles. Normal respiratory effort. No accessory muscle use at rest, room air Cardiovascular: Regular rate and rhythm, no murmurs / rubs / gallops. No extremity edema. 2+ pedal pulses.  No carotid bruits.  Abdomen: no tenderness, no masses palpated. No hepatosplenomegaly. Bowel sounds positive.  Musculoskeletal: no clubbing / cyanosis. No joint deformity upper and lower extremities. Good ROM, no contractures. Normal muscle tone.  Skin: no rashes, lesions, ulcers. No induration Neurologic: CN 2-12 grossly intact. Sensation intact, DTR normal. Strength 5/5 x all 4 extremities.  Psychiatric: Normal judgment and insight. Alert and oriented x 3. Normal mood.    Labs on Admission: I have personally reviewed following labs and imaging studies  CBC:  Recent Labs Lab 02/08/16 1131  WBC 5.2  HGB 15.9  HCT 49.4  MCV 86.7  PLT 203   Basic Metabolic Panel:  Recent Labs Lab 02/08/16 1131  NA 135  K 4.3  CL 105  CO2 23  GLUCOSE 121*  BUN 11  CREATININE 0.99  CALCIUM 9.3   GFR: Estimated Creatinine Clearance: 105.8 mL/min (by C-G formula based on SCr of 0.99 mg/dL). Liver Function Tests:  Recent Labs Lab 02/08/16 1444  AST 60*  ALT 50  ALKPHOS 31*  BILITOT 0.8  PROT 5.5*  ALBUMIN 3.5   No results for input(s): LIPASE, AMYLASE in the last 168 hours. No results for input(s): AMMONIA in the last 168 hours. Coagulation Profile: No results for input(s): INR, PROTIME in the last 168 hours. Cardiac Enzymes:  Recent Labs Lab 02/08/16 1131 02/08/16 1444  CKTOTAL 1,592*  --   CKMB 9.4*  --   TROPONINI  --  <0.03   BNP (last 3 results) No results for input(s): PROBNP in the last 8760 hours. HbA1C: No results for input(s): HGBA1C in the last 72 hours. CBG:  Recent Labs Lab 02/08/16 1349 02/08/16 1458  GLUCAP 53* 93   Lipid Profile: No results for input(s): CHOL, HDL, LDLCALC, TRIG, CHOLHDL, LDLDIRECT in the last 72 hours. Thyroid Function Tests: No results for input(s): TSH, T4TOTAL, FREET4, T3FREE, THYROIDAB in the last 72 hours. Anemia Panel: No results for input(s): VITAMINB12, FOLATE, FERRITIN, TIBC, IRON, RETICCTPCT in the last 72  hours. Urine analysis: No results found for: COLORURINE, APPEARANCEUR, LABSPEC, PHURINE, GLUCOSEU, HGBUR, BILIRUBINUR, KETONESUR, PROTEINUR, UROBILINOGEN, NITRITE, LEUKOCYTESUR Sepsis Labs: _1 (procalcitonin:4,lacticidven:4) )No results found for this or any previous visit (from the past 240 hour(s)).   Radiological Exams on Admission: Dg Chest 2 View  Result Date: 02/08/2016 CLINICAL DATA:  Pt reports increased SOB with intermittent left side CP x 2.5 months; he reports abnormal labs and his Dr's  office sent him here; he reports h/o DM, HTN, and cardiac cath; former smoker EXAM: CHEST  2 VIEW COMPARISON:  09/22/2005 FINDINGS: Cardiac silhouette is normal in size. The aorta is uncoiled and tortuous. No mediastinal or hilar masses or evidence of adenopathy. Clear lungs.  No pleural effusion or pneumothorax. Skeletal structures are demineralized intact. IMPRESSION: No active cardiopulmonary disease. Electronically Signed   By: Lajean Manes M.D.   On: 02/08/2016 12:22    EKG: (Independently reviewed) sinus rhythm, ventricular rate 73 bpm, QTC 409 ms, no acute ischemic changes, no change from previous EKG  Assessment/Plan Principal Problem:   Acute on (presumed) chronic respiratory failure with hypoxia  -Patient presents with a two-month history of progressive dyspnea, known sleep apnea noncompliant with CPAP and recent 20 pound weight gain after starting Lyrica-documented ambulatory hypoxemia in ER and suspect this is secondary to combination of deconditioning, obesity hypoventilation syndrome -Chest x-ray without evidence of volume overload or pulmonary edema -D-dimer negative -Check echocardiogram -May need to repeat ambulatory pulse oximetry after admission; suspect patient will require oxygen for use at home -No leukocytosis or cough that would be suspicious for infectious etiology -Room air PO2 on ABG was 63-remote history of tobacco abuse greater than 30 years ago -BNP pending at  time of admission; clinical exam not c/w volume overload  Active Problems:   Chest pain /CAD, NATIVE VESSEL (40-50% first diag LAD in 2012) -Patient presents with atypical left-sided anterior chest pain without radiation or associated symptoms and chest pain not associated with episodes of shortness of breath -Echocardiogram as above -Cycle troponin -Myoview stress test in a.m. -Continue beta blocker but given recent suboptimal blood pressures have decreased from 100 mg to 25 mg for now -EDP spoke with cardiologist on call Dr. Rayann Heman who reviewed the patient's medical records and determine at this juncture no indications for cardiology consultation    OSA (obstructive sleep apnea) noncompliant w/ CPAP/ ?? OHS -Discussed with patient and apparently had issues with mask fit and couldn't sleep because of noise his machine made -We'll order at bedtime CPAP while here-patient may need several mask adjustments either here or after discharge with home health RT -Patient counseled regarding sequelae of noncompliance with OSA treatment including weight gain, right-sided heart failure, pulmonary hypertension, daytime sleepiness and excessive fatigue -Patient has issues of chronic insomnia dating back to childhood further complicating his sleep disorder    Elevated CK -Recent discontinuation of pravastatin one week ago presumed secondary to elevated CK -CK almost 1600 today -Renal function is normal -Check lactic acid -Possibly due to over diuresis so holding preadmission Aldactone and hydrochlorothiazide combination -Repeat CK in a.m. -Continue gentle IV fluid hydration for now    HTN (hypertension)/left ventricular hypertrophy -ED documented issues with suboptimal blood pressure acquiring 500 mL saline bolus -Hold preadmission Norvasc, Prinzide, and Aldactone-Lopressor decreased to minimal dosage to avoid rebound tachycardia -Follow vital signs -Check orthostatic vital signs    Diabetes  mellitus type 2, insulin dependent  -Followed by endocrinology as an outpatient with documented issues with over control in hypoglycemia in the outpatient setting -Continue Amaryl, Toujeo and Victoza -Follow CBGs and provide SSI -Check hemoglobin A1c -Metformin on hold until assured lactic acid is normal in setting of elevated CK    HLD (hyperlipidemia) -Pravastatin discontinued one week ago by PCP -Continue fenofibrate    Fibromyalgia -No change in baseline chronic pain -Continue Neurontin, Abilify, Cymbalta, Robaxin, Lyrica and Requip    History of Cough syncope -Has not had any episodes  since prior hospitalization for this issue      DVT prophylaxis: Lovenox Code Status: Full  Family Communication: Wife at bedside Disposition Plan: Anticipate discharge back to preadmission home environment pending medical stability-if meets criteria anticipate will discharge with home health O2 and possible new order for CPAP Consults called: None Admission status: Observation/telemetry    Edmon Magid L. ANP-BC [facility] Pager [phone]   If 7PM-7AM, please contact night-coverage [email or web address] Password [email or web address]  02/08/2016, 3:41 PM

## 2016-02-09 ENCOUNTER — Observation Stay (HOSPITAL_BASED_OUTPATIENT_CLINIC_OR_DEPARTMENT_OTHER): Payer: Commercial Managed Care - PPO

## 2016-02-09 ENCOUNTER — Observation Stay (HOSPITAL_COMMUNITY): Payer: Commercial Managed Care - PPO

## 2016-02-09 DIAGNOSIS — R05 Cough: Secondary | ICD-10-CM | POA: Diagnosis not present

## 2016-02-09 DIAGNOSIS — G4733 Obstructive sleep apnea (adult) (pediatric): Secondary | ICD-10-CM

## 2016-02-09 DIAGNOSIS — R079 Chest pain, unspecified: Secondary | ICD-10-CM

## 2016-02-09 DIAGNOSIS — J9621 Acute and chronic respiratory failure with hypoxia: Secondary | ICD-10-CM | POA: Diagnosis not present

## 2016-02-09 DIAGNOSIS — E119 Type 2 diabetes mellitus without complications: Secondary | ICD-10-CM | POA: Diagnosis not present

## 2016-02-09 DIAGNOSIS — Z794 Long term (current) use of insulin: Secondary | ICD-10-CM

## 2016-02-09 DIAGNOSIS — R0600 Dyspnea, unspecified: Secondary | ICD-10-CM | POA: Diagnosis not present

## 2016-02-09 DIAGNOSIS — I517 Cardiomegaly: Secondary | ICD-10-CM

## 2016-02-09 DIAGNOSIS — E785 Hyperlipidemia, unspecified: Secondary | ICD-10-CM

## 2016-02-09 DIAGNOSIS — I10 Essential (primary) hypertension: Secondary | ICD-10-CM

## 2016-02-09 LAB — TROPONIN I

## 2016-02-09 LAB — GLUCOSE, CAPILLARY
Glucose-Capillary: 153 mg/dL — ABNORMAL HIGH (ref 65–99)
Glucose-Capillary: 150 mg/dL — ABNORMAL HIGH (ref 65–99)
Glucose-Capillary: 169 mg/dL — ABNORMAL HIGH (ref 65–99)
Glucose-Capillary: 210 mg/dL — ABNORMAL HIGH (ref 65–99)

## 2016-02-09 LAB — NM MYOCAR MULTI W/SPECT W/WALL MOTION / EF
CSEPPHR: 95 {beats}/min
Estimated workload: 1 METS
MPHR: 169 {beats}/min
Percent HR: 56 %
Rest HR: 68 {beats}/min

## 2016-02-09 LAB — HEMOGLOBIN A1C
HEMOGLOBIN A1C: 8.3 % — AB (ref 4.8–5.6)
Mean Plasma Glucose: 192 mg/dL

## 2016-02-09 LAB — T4, FREE: Free T4: 0.79 ng/dL (ref 0.61–1.12)

## 2016-02-09 LAB — ECHOCARDIOGRAM COMPLETE
Height: 69 in
WEIGHTICAEL: 4628.8 [oz_av]

## 2016-02-09 LAB — CK: Total CK: 1004 U/L — ABNORMAL HIGH (ref 49–397)

## 2016-02-09 MED ORDER — PERFLUTREN LIPID MICROSPHERE
INTRAVENOUS | Status: AC
  Filled 2016-02-09: qty 10

## 2016-02-09 MED ORDER — TECHNETIUM TC 99M TETROFOSMIN IV KIT
30.0000 | PACK | Freq: Once | INTRAVENOUS | Status: AC | PRN
  Administered 2016-02-09: 30 via INTRAVENOUS

## 2016-02-09 MED ORDER — TECHNETIUM TC 99M TETROFOSMIN IV KIT
10.0000 | PACK | Freq: Once | INTRAVENOUS | Status: AC | PRN
  Administered 2016-02-09: 10 via INTRAVENOUS

## 2016-02-09 MED ORDER — PERFLUTREN LIPID MICROSPHERE
6.0000 mL | INTRAVENOUS | Status: AC | PRN
  Filled 2016-02-09: qty 10

## 2016-02-09 MED ORDER — REGADENOSON 0.4 MG/5ML IV SOLN
0.4000 mg | Freq: Once | INTRAVENOUS | Status: AC
  Administered 2016-02-09: 0.4 mg via INTRAVENOUS

## 2016-02-09 MED ORDER — REGADENOSON 0.4 MG/5ML IV SOLN
INTRAVENOUS | Status: AC
  Filled 2016-02-09: qty 5

## 2016-02-09 NOTE — Progress Notes (Signed)
Pt had been npo and down for stress test most of am. Upon return he determined that it was best to hold his SOPN home insulin for today.  Will continue  To check cbg before meals and at hs

## 2016-02-09 NOTE — Progress Notes (Signed)
Echocardiogram 2D Echocardiogram with contrast has been performed.  Grant Williams, Grant Williams M 02/09/2016, 1:17 PM

## 2016-02-09 NOTE — Progress Notes (Signed)
Spoke with Dr. Laural BenesJohnson about stress test results - low risk. He will likely discharge patient in the am after other medical issues resolve.

## 2016-02-09 NOTE — Progress Notes (Signed)
The patient was seen in nuclear medicine for a Lexiscan myoview. He tolerated the procedure well. No acute ST or TW changes on ECG.    STRESS IMAGES TO FOLLOW  Erin Smith, NP  [facility]   

## 2016-02-09 NOTE — Progress Notes (Signed)
PROGRESS NOTE    Grant Williams  IWL:[id number]  DOB: 1955/06/30  DOA: 02/08/2016 PCP: Elizabeth Palau, FNP Outpatient Specialists:   Hospital course: Sent from M.D. office for abnormal labs; reporting shortness of breath and chest pain. Patient reports that in the past few weeks his lipid agents have been adjusted by his physician. Initially his fenofibrate was discontinued but then quickly resumed. One week ago his pravastatin was discontinued secondary to abnormal labs. Patient went to his PCP yesterday to have repeat CK level drawn and this was elevated at greater than 1600 so PCP sent him to ER for further evaluation.  Assessment & Plan:    Acute on (presumed) chronic respiratory failure with hypoxia  -Patient presents with a two-month history of progressive dyspnea, known sleep apnea noncompliant with CPAP and recent 20 pound weight gain after starting Lyrica-documented ambulatory hypoxemia in ER and suspect this is secondary to combination of deconditioning, obesity hypoventilation syndrome -Chest x-ray without evidence of volume overload or pulmonary edema -D-dimer negative -Check echocardiogram -May need to repeat ambulatory pulse oximetry after admission;  -No leukocytosis or cough that would be suspicious for infectious etiology -Room air PO2 on ABG was p02 63-remote history of tobacco abuse greater than 30 years ago -BNP normal, d dimer normal, clinical exam not c/w volume overload    Chest pain /CAD, NATIVE VESSEL (40-50% first diag LAD in 2012) -Patient presents with atypical left-sided anterior chest pain without radiation or associated symptoms and chest pain not associated with episodes of shortness of breath -Echocardiogram as above -Cycle troponin -Myoview stress test in a.m. -Continue beta blocker but given recent suboptimal blood pressures have decreased from 100 mg to 25 mg for now -EDP spoke with cardiologist on call Dr. Johney Frame who reviewed the patient's  medical records and determine at this juncture no indications for cardiology consultation    OSA (obstructive sleep apnea) noncompliant w/ CPAP/ ?? OHS -Discussed with patient and apparently had issues with mask fit and couldn't sleep because of noise his machine made - bedtime CPAP while here-patient may need several mask adjustments either here or after discharge with home health RT -Patient counseled regarding sequelae of noncompliance with OSA treatment including weight gain, right-sided heart failure, pulmonary hypertension, daytime sleepiness and excessive fatigue -Patient has issues of chronic insomnia dating back to childhood further complicating his sleep disorder    Elevated CK -Recent discontinuation of pravastatin one week ago presumed secondary to elevated CK -CK improving with hydration -Renal function is normal - lactic acid within normal limits    HTN (hypertension)/left ventricular hypertrophy -ED documented issues with suboptimal blood pressure acquiring 500 mL saline bolus -Hold preadmission Norvasc, Prinzide, and Aldactone -Lopressor decreased to minimal dosage to avoid rebound tachycardia     Diabetes mellitus type 2, insulin dependent  -Followed by endocrinology as an outpatient with documented issues with over control in hypoglycemia in the outpatient setting -hold amaryl and victoza -Follow CBGs and provide SSI -Check hemoglobin A1c -Metformin on hold     HLD (hyperlipidemia) -Pravastatin discontinued one week ago by PCP -Continue fenofibrate    Fibromyalgia -No change in baseline chronic pain -Continue Neurontin, Abilify, Cymbalta, Robaxin, Lyrica and Requip    History of Cough syncope -Has not had any episodes since prior hospitalization for this issue    DVT prophylaxis: Lovenox Code Status: Full  Family Communication: Wife at bedside Disposition Plan: Anticipate discharge back to preadmission home environment pending medical stability-if  meets criteria anticipate will discharge with home health O2  and possible new order for CPAP Consults called: None Admission status: Observation/telemetry Subjective: Pt feeling better this morning.  Objective: Vitals:   02/09/16 1041 02/09/16 1105 02/09/16 1107 02/09/16 1109  BP: 133/79 138/85 133/65 129/68  Pulse: 74 82 90 86  Resp:      Temp:      TempSrc:      SpO2:      Weight:      Height:        Intake/Output Summary (Last 24 hours) at 02/09/16 1118 Last data filed at 02/09/16 0921  Gross per 24 hour  Intake              100 ml  Output             1575 ml  Net            -1475 ml   Filed Weights   02/08/16 1139 02/08/16 1740 02/09/16 0645  Weight: 132.6 kg (292 lb 4.8 oz) 131.5 kg (289 lb 14.5 oz) 131.2 kg (289 lb 4.8 oz)    Exam:  General exam: awake, alert, no distress, comfortable Respiratory system: Clear. No increased work of breathing. Cardiovascular system: S1 & S2 heard, RRR. No JVD, murmurs, gallops, clicks or pedal edema. Gastrointestinal system: Abdomen is nondistended, soft and nontender. Normal bowel sounds heard. Central nervous system: Alert and oriented. No focal neurological deficits. Extremities: no CCE.  Data Reviewed: Basic Metabolic Panel:  Recent Labs Lab 02/08/16 1131  NA 135  K 4.3  CL 105  CO2 23  GLUCOSE 121*  BUN 11  CREATININE 0.99  CALCIUM 9.3   Liver Function Tests:  Recent Labs Lab 02/08/16 1444  AST 60*  ALT 50  ALKPHOS 31*  BILITOT 0.8  PROT 5.5*  ALBUMIN 3.5   No results for input(s): LIPASE, AMYLASE in the last 168 hours. No results for input(s): AMMONIA in the last 168 hours. CBC:  Recent Labs Lab 02/08/16 1131  WBC 5.2  HGB 15.9  HCT 49.4  MCV 86.7  PLT 154   Cardiac Enzymes:  Recent Labs Lab 02/08/16 1131 02/08/16 1444 02/08/16 2025 02/09/16 0102 02/09/16 0226 02/09/16 0800  CKTOTAL 1,592*  --   --   --  1,004*  --   CKMB 9.4*  --   --   --   --   --   TROPONINI  --  <0.03 <0.03  <0.03  --  <0.03   CBG (last 3)   Recent Labs  02/08/16 1458 02/08/16 2127 02/09/16 0636  GLUCAP 93 177* 153*   No results found for this or any previous visit (from the past 240 hour(s)).   Studies: Dg Chest 2 View  Result Date: 02/08/2016 CLINICAL DATA:  Pt reports increased SOB with intermittent left side CP x 2.5 months; he reports abnormal labs and his Dr's office sent him here; he reports h/o DM, HTN, and cardiac cath; former smoker EXAM: CHEST  2 VIEW COMPARISON:  09/22/2005 FINDINGS: Cardiac silhouette is normal in size. The aorta is uncoiled and tortuous. No mediastinal or hilar masses or evidence of adenopathy. Clear lungs.  No pleural effusion or pneumothorax. Skeletal structures are demineralized intact. IMPRESSION: No active cardiopulmonary disease. Electronically Signed   By: Amie Portlandavid  Ormond M.D.   On: 02/08/2016 12:22     Scheduled Meds: . regadenoson      . ARIPiprazole  5 mg Oral Daily  . aspirin  325 mg Oral Daily  . DULoxetine  60 mg Oral BID  .  enoxaparin (LOVENOX) injection  40 mg Subcutaneous Q24H  . fenofibrate  160 mg Oral Daily  . gabapentin  800 mg Oral Q6H  . insulin aspart  0-5 Units Subcutaneous QHS  . insulin aspart  0-9 Units Subcutaneous TID WC  . Insulin Glargine  250 Units Subcutaneous Daily  . methocarbamol  500 mg Oral Daily   And  . methocarbamol  500 mg Oral Daily   And  . methocarbamol  1,000 mg Oral Daily  . metoprolol tartrate  25 mg Oral BID  . pantoprazole  40 mg Oral BID  . pregabalin  150 mg Oral TID  . rOPINIRole  1 mg Oral QHS   Continuous Infusions: . sodium chloride 75 mL/hr at 02/08/16 1606    Principal Problem:   Acute on (presumed) chronic respiratory failure with hypoxia ([facility]) Active Problems:   HLD (hyperlipidemia)   HTN (hypertension)   CAD, NATIVE VESSEL (40-50% first diag LAD in 2012)   OSA (obstructive sleep apnea) noncompliant w/ CPAP   Chest pain   Diabetes mellitus type 2, insulin dependent ([facility])   LVH  (left ventricular hypertrophy)   Elevated CK   Fibromyalgia   History of Cough syncope   Time spent:   Standley Dakinslanford Taiylor Virden, MD, FAAFP [facility] Pager [phone]  If 7PM-7AM, please contact night-coverage [email or web address] Password [email or web address] 02/09/2016, 11:18 AM    LOS: 0 days

## 2016-02-10 DIAGNOSIS — R748 Abnormal levels of other serum enzymes: Secondary | ICD-10-CM

## 2016-02-10 DIAGNOSIS — R0600 Dyspnea, unspecified: Secondary | ICD-10-CM | POA: Diagnosis not present

## 2016-02-10 DIAGNOSIS — J9621 Acute and chronic respiratory failure with hypoxia: Secondary | ICD-10-CM | POA: Diagnosis not present

## 2016-02-10 DIAGNOSIS — R05 Cough: Secondary | ICD-10-CM | POA: Diagnosis not present

## 2016-02-10 DIAGNOSIS — R079 Chest pain, unspecified: Secondary | ICD-10-CM | POA: Diagnosis not present

## 2016-02-10 DIAGNOSIS — M797 Fibromyalgia: Secondary | ICD-10-CM

## 2016-02-10 LAB — COMPREHENSIVE METABOLIC PANEL
ALT: 53 U/L (ref 17–63)
ANION GAP: 7 (ref 5–15)
AST: 44 U/L — ABNORMAL HIGH (ref 15–41)
Albumin: 3.6 g/dL (ref 3.5–5.0)
Alkaline Phosphatase: 37 U/L — ABNORMAL LOW (ref 38–126)
BUN: 11 mg/dL (ref 6–20)
CHLORIDE: 105 mmol/L (ref 101–111)
CO2: 26 mmol/L (ref 22–32)
CREATININE: 1 mg/dL (ref 0.61–1.24)
Calcium: 8.5 mg/dL — ABNORMAL LOW (ref 8.9–10.3)
Glucose, Bld: 113 mg/dL — ABNORMAL HIGH (ref 65–99)
Potassium: 3.7 mmol/L (ref 3.5–5.1)
Sodium: 138 mmol/L (ref 135–145)
Total Bilirubin: 0.6 mg/dL (ref 0.3–1.2)
Total Protein: 6 g/dL — ABNORMAL LOW (ref 6.5–8.1)

## 2016-02-10 LAB — CBC
HCT: 47 % (ref 39.0–52.0)
Hemoglobin: 15.2 g/dL (ref 13.0–17.0)
MCH: 28.3 pg (ref 26.0–34.0)
MCHC: 32.3 g/dL (ref 30.0–36.0)
MCV: 87.4 fL (ref 78.0–100.0)
PLATELETS: 148 10*3/uL — AB (ref 150–400)
RBC: 5.38 MIL/uL (ref 4.22–5.81)
RDW: 14.4 % (ref 11.5–15.5)
WBC: 5.9 10*3/uL (ref 4.0–10.5)

## 2016-02-10 LAB — CK: CK TOTAL: 612 U/L — AB (ref 49–397)

## 2016-02-10 LAB — GLUCOSE, CAPILLARY: Glucose-Capillary: 127 mg/dL — ABNORMAL HIGH (ref 65–99)

## 2016-02-10 MED ORDER — METOPROLOL TARTRATE 25 MG PO TABS: 25.0000 mg | ORAL_TABLET | Freq: Two times a day (BID) | ORAL | 0 refills | Status: DC

## 2016-02-10 MED ORDER — SODIUM CHLORIDE 0.9 % IV BOLUS (SEPSIS)
750.0000 mL | Freq: Once | INTRAVENOUS | Status: AC
  Administered 2016-02-10: 750 mL via INTRAVENOUS

## 2016-02-10 NOTE — Discharge Instructions (Signed)
Nonspecific Chest Pain It is often hard to find the cause of chest pain. There is always a chance that your pain could be related to something serious, such as a heart attack or a blood clot in your lungs. Chest pain can also be caused by conditions that are not life-threatening. If you have chest pain, it is very important to follow up with your doctor.  HOME CARE  If you were prescribed an antibiotic medicine, finish it all even if you start to feel better.  Avoid any activities that cause chest pain.  Do not use any tobacco products, including cigarettes, chewing tobacco, or electronic cigarettes. If you need help quitting, ask your doctor.  Do not drink alcohol.  Take medicines only as told by your doctor.  Keep all follow-up visits as told by your doctor. This is important. This includes any further testing if your chest pain does not go away.  Your doctor may tell you to keep your head raised (elevated) while you sleep.  Make lifestyle changes as told by your doctor. These may include:  Getting regular exercise. Ask your doctor to suggest some activities that are safe for you.  Eating a heart-healthy diet. Your doctor or a diet specialist (dietitian) can help you to learn healthy eating options.  Maintaining a healthy weight.  Managing diabetes, if necessary.  Reducing stress. GET HELP IF:  Your chest pain does not go away, even after treatment.  You have a rash with blisters on your chest.  You have a fever. GET HELP RIGHT AWAY IF:  Your chest pain is worse.  You have an increasing cough, or you cough up blood.  You have severe belly (abdominal) pain.  You feel extremely weak.  You pass out (faint).  You have chills.  You have sudden, unexplained chest discomfort.  You have sudden, unexplained discomfort in your arms, back, neck, or jaw.  You have shortness of breath at any time.  You suddenly start to sweat, or your skin gets clammy.  You feel  nauseous.  You vomit.  You suddenly feel light-headed or dizzy.  Your heart begins to beat quickly, or it feels like it is skipping beats. These symptoms may be an emergency. Do not wait to see if the symptoms will go away. Get medical help right away. Call your local emergency services (911 in the [place] Do not drive yourself to the hospital.   This information is not intended to replace advice given to you by your health care provider. Make sure you discuss any questions you have with your health care provider.   Document Released: 11/25/2007 Document Revised: 06/29/2014 Document Reviewed: 01/12/2014 Elsevier Interactive Patient Education 2016 [employer]   Creatine Kinase Test WHY AM I HAVING THIS TEST? The creatine kinase (CK) test is performed to determine if there has been damage to muscle tissue in your body. This test can be used to help diagnose heart attack, neurologic diseases, or skeletal diseases. Three different forms of CK are present in your body. They are referred to as isoenzymes:  CK-MM is found in your skeletal muscles and heart.  CK-MB is found mostly in your heart.  CK-BB is found mostly in your brain. WHAT KIND OF SAMPLE IS TAKEN? A blood sample is required for this test. It is usually collected by inserting a needle into a vein.  HOW DO I PREPARE FOR THE TEST? There is no preparation required for this test. Be aware that your health care provider may  require blood samples to be taken at regular intervals for up to 1 week. WHAT ARE THE REFERENCE RANGES? Reference ranges are considered healthy ranges established after testing a large group of healthy people. Reference ranges may vary among different people, labs, and hospitals. It is your responsibility to obtain your test results. Ask the lab or department performing the test when and how you will get your results.  The reference ranges for the CK test are as follows: Total CK:  Adult or elderly (values are  higher after exercise):  Male: 55-170 units/L or 55-170 units/L (SI units).  Male: 30-135 units/L or 30-135 units/L (SI units).  Newborn: 68-580 units/L (SI units). Isoenzymes:  CK-MM: 100%.  CK-MB: 0%.  CK-BB: 0%. WHAT DO THE RESULTS MEAN? Levels of total CK that are above the reference ranges may indicate injury or diseases affecting the heart, skeletal muscle, or brain. Increased levels of CK-MM isoenzyme may indicate:  Certain diseases affecting the skeletal muscle.  Recent surgery, trauma, or injury.  Conditions that cause convulsions. Increased levels of CK-MB isoenzyme may indicate:  Recent heart attack.  Other conditions that cause injury to the heart muscle. Increased levels of CK-BB isoenzyme may indicate:  Diseases that affect the central nervous system.  Certain psychiatric therapies.  Certain types of cancer.  Injury to the lungs. Talk with your health care provider to discuss your results, treatment options, and if necessary, the need for more tests. Talk with your health care provider if you have any questions about your results.   This information is not intended to replace advice given to you by your health care provider. Make sure you discuss any questions you have with your health care provider.   Document Released: 07/09/2004 Document Revised: 06/29/2014 Document Reviewed: 11/02/2013 Elsevier Interactive Patient Education 2016 [employer]    Hypoglycemia Low blood sugar (hypoglycemia) means that the level of sugar in your blood is lower than it should be. Signs of low blood sugar include:  Getting sweaty.  Feeling hungry.  Feeling dizzy or weak.  Feeling sleepier than normal.  Feeling nervous.  Headaches.  Having a fast heartbeat. Low blood sugar can happen fast and can be an emergency. Your doctor can do tests to check your blood sugar level. You can have low blood sugar and not have diabetes. HOME CARE  Check your blood sugar  as told by your doctor. If it is less than 70 mg/dl or as told by your doctor, take 1 of the following:  3 to 4 glucose tablets.   cup clear juice.   cup soda pop, not diet.  1 cup milk.  5 to 6 hard candies.  Recheck blood sugar after 15 minutes. Repeat until it is at the right level.  Eat a snack if it is more than 1 hour until the next meal.  Only take medicine as told by your doctor.  Do not skip meals. Eat on time.  Do not drink alcohol except with meals.  Check your blood glucose before driving.  Check your blood glucose before and after exercise.  Always carry treatment with you, such as glucose pills.  Always wear a medical alert bracelet if you have diabetes. GET HELP RIGHT AWAY IF:   Your blood glucose goes below 70 mg/dl or as told by your doctor, and you:  Are confused.  Are not able to swallow.  Pass out (faint).  You cannot treat yourself. You may need someone to help you.  You have low  blood sugar problems often.  You have problems from your medicines.  You are not feeling better after 3 to 4 days.  You have vision changes. MAKE SURE YOU:   Understand these instructions.  Will watch this condition.  Will get help right away if you are not doing well or get worse.   This information is not intended to replace advice given to you by your health care provider. Make sure you discuss any questions you have with your health care provider.   Document Released: 09/02/2009 Document Revised: 06/29/2014 Document Reviewed: 02/12/2015 Elsevier Interactive Patient Education 2016 [employer] Blood Glucose Monitoring, Adult Monitoring your blood glucose (also know as blood sugar) helps you to manage your diabetes. It also helps you and your health care provider monitor your diabetes and determine how well your treatment plan is working. WHY SHOULD YOU MONITOR YOUR BLOOD GLUCOSE?  It can help you understand how food, exercise, and medicine affect  your blood glucose.  It allows you to know what your blood glucose is at any given moment. You can quickly tell if you are having low blood glucose (hypoglycemia) or high blood glucose (hyperglycemia).  It can help you and your health care provider know how to adjust your medicines.  It can help you understand how to manage an illness or adjust medicine for exercise. WHEN SHOULD YOU TEST? Your health care provider will help you decide how often you should check your blood glucose. This may depend on the type of diabetes you have, your diabetes control, or the types of medicines you are taking. Be sure to write down all of your blood glucose readings so that this information can be reviewed with your health care provider. See below for examples of testing times that your health care provider may suggest. Type 1 Diabetes  Test at least 2 times per day if your diabetes is well controlled, if you are using an insulin pump, or if you perform multiple daily injections.  If your diabetes is not well controlled or if you are sick, you may need to test more often.  It is a good idea to also test:  Before every insulin injection.  Before and after exercise.  Between meals and 2 hours after a meal.  Occasionally between 2:00 a.m. and 3:00 a.m. Type 2 Diabetes  If you are taking insulin, test at least 2 times per day. However, it is best to test before every insulin injection.  If you take medicines by mouth (orally), test 2 times a day.  If you are on a controlled diet, test once a day.  If your diabetes is not well controlled or if you are sick, you may need to monitor more often. HOW TO MONITOR YOUR BLOOD GLUCOSE Supplies Needed  Blood glucose meter.  Test strips for your meter. Each meter has its own strips. You must use the strips that go with your own meter.  A pricking needle (lancet).  A device that holds the lancet (lancing device).  A journal or log book to write down your  results. Procedure  Wash your hands with soap and water. Alcohol is not preferred.  Prick the side of your finger (not the tip) with the lancet.  Gently milk the finger until a small drop of blood appears.  Follow the instructions that come with your meter for inserting the test strip, applying blood to the strip, and using your blood glucose meter. Other Areas to Get Blood for Testing Some meters  allow you to use other areas of your body (other than your finger) to test your blood. These areas are called alternative sites. The most common alternative sites are:  The forearm.  The thigh.  The back area of the lower leg.  The palm of the hand. The blood flow in these areas is slower. Therefore, the blood glucose values you get may be delayed, and the numbers are different from what you would get from your fingers. Do not use alternative sites if you think you are having hypoglycemia. Your reading will not be accurate. Always use a finger if you are having hypoglycemia. Also, if you cannot feel your lows (hypoglycemia unawareness), always use your fingers for your blood glucose checks. ADDITIONAL TIPS FOR GLUCOSE MONITORING  Do not reuse lancets.  Always carry your supplies with you.  All blood glucose meters have a 24-hour "hotline" number to call if you have questions or need help.  Adjust (calibrate) your blood glucose meter with a control solution after finishing a few boxes of strips. BLOOD GLUCOSE RECORD KEEPING It is a good idea to keep a daily record or log of your blood glucose readings. Most glucose meters, if not all, keep your glucose records stored in the meter. Some meters come with the ability to download your records to your home computer. Keeping a record of your blood glucose readings is especially helpful if you are wanting to look for patterns. Make notes to go along with the blood glucose readings because you might forget what happened at that exact time. Keeping  good records helps you and your health care provider to work together to achieve good diabetes management.    This information is not intended to replace advice given to you by your health care provider. Make sure you discuss any questions you have with your health care provider.   Document Released: 06/11/2003 Document Revised: 06/29/2014 Document Reviewed: 10/31/2012 Elsevier Interactive Patient Education 2016 [employer]  Sleep Apnea Sleep apnea is disorder that affects a person's sleep. A person with sleep apnea has abnormal pauses in their breathing when they sleep. It is hard for them to get a good sleep. This makes a person tired during the day. It also can lead to other physical problems. There are three types of sleep apnea. One type is when breathing stops for a short time because your airway is blocked (obstructive sleep apnea). Another type is when the brain sometimes fails to give the normal signal to breathe to the muscles that control your breathing (central sleep apnea). The third type is a combination of the other two types. HOME CARE  Do not sleep on your back. Try to sleep on your side.  Take all medicine as told by your doctor.  Avoid alcohol, calming medicines (sedatives), and depressant drugs.  Try to lose weight if you are overweight. Talk to your doctor about a healthy weight goal. Your doctor may have you use a device that helps to open your airway. It can help you get the air that you need. It is called a positive airway pressure (PAP) device. There are three types of PAP devices:  Continuous positive airway pressure (CPAP) device.  Nasal expiratory positive airway pressure (EPAP) device.  Bilevel positive airway pressure (BPAP) device. MAKE SURE YOU:  Understand these instructions.  Will watch your condition.  Will get help right away if you are not doing well or get worse.   This information is not intended to replace advice given  to you by your health  care provider. Make sure you discuss any questions you have with your health care provider.   Document Released: 03/17/2008 Document Revised: 06/29/2014 Document Reviewed: 10/10/2011 Elsevier Interactive Patient Education [employer]

## 2016-02-10 NOTE — Progress Notes (Signed)
Pt has orders to be discharged. Discharge instructions given and pt has no additional questions at this time. Medication regimen reviewed and pt educated. Pt verbalized understanding and has no additional questions. Telemetry box removed. IV removed and site in good condition. Pt stable and waiting for transportation.   Helane Briceno RN 

## 2016-02-10 NOTE — Discharge Summary (Signed)
Physician Discharge Summary  Grant Williams:[id number] DOB: 14-Jul-1955 DOA: 02/08/2016  PCP: Vicenta Aly, FNP  Admit date: 02/08/2016 Discharge date: 02/10/2016  Admitted From: Home Disposition:  Home  Recommendations for Outpatient Follow-up:  1. Follow up with PCP in 4-5 days, to get labs rechecked 2. Please obtain BMP/CBC/CPK in 4 days with PCP 3. Please arrange outpatient sleep study  Discharge Condition: Stable CODE STATUS: FULL   Brief/Interim Summary: Hospital course: Sent from M.D. office for abnormal labs; reporting shortness of breath and chest pain. Patient reports that in the past few weeks his lipid agents have been adjusted by his physician. Initially his fenofibrate was discontinued but then quickly resumed. One week ago his pravastatin was discontinued secondary to abnormal labs. Patient went to his PCP yesterday to have repeat CK level drawn and this was elevated at greater than 1600 so PCP sent him to ER for further evaluation.  Assessment & Plan:   Acute on (presumed) chronic respiratory failure with hypoxia  -Patient presents with a two-month history of progressive dyspnea, known sleep apnea noncompliant with CPAP and recent 20 pound weight gain after starting Lyrica-documented ambulatory hypoxemia in ER and suspect this is secondary to combination of deconditioning, obesity hypoventilation syndrome -Chest x-ray without evidence of volume overload or pulmonary edema -D-dimer negative -echocardiogram: - Mild LVH with LVEF 55-60%. Grade 1 diastolic dysfunction. MACwith trivial mitral regurgitation. Mild aortic annular   calcification. Mild RV enlargement with preserved contraction. Trivial tricuspid regurgitation. -No leukocytosis or cough that would be suspicious for infectious etiology -Room air PO2 on ABG was p02 63-remote history of tobacco abuse greater than 30 years ago -BNPnormal, d dimer normal,clinical exam not c/w volume overload  Chest pain  /CAD, NATIVE VESSEL (40-50% first diag LAD in 2012) -Patient presents with atypical left-sided anterior chest pain without radiation or associated symptoms and chest pain not associated with episodes of shortness of breath -Echocardiogram as above -troponin neg x 3 -Myoview stress test no ischemic findings. -Continue beta blocker but given recent suboptimal blood pressures have decreased from 100 mg to 25 mg for now -EDPspoke with cardiologist on call Dr. Rayann Heman who reviewed the patient's medical records and determine at this juncture no indications for cardiology consultation  OSA (obstructive sleep apnea) noncompliant w/ CPAP/ ?? OHS -Discussed with patient and apparently had issues with mask fit and couldn't sleep because of noise his machine made - bedtime CPAP while here-patient may need several mask adjustments either here or after discharge with home health RT -Patient counseled regarding sequelae of noncompliance with OSA treatment including weight gain, right-sided heart failure, pulmonary hypertension, daytime sleepiness and excessive fatigue -Patient has issues of chronic insomnia dating back to childhood further complicating his sleep disorder  Elevated CK -Recent discontinuation of pravastatin one week ago presumed secondary to elevated CK -CK improving with hydration, level trending down nicely, recommend follow up recheck level with PCP later this weel -Renal function is normal - lactic acid within normal limits  HTN (hypertension)/left ventricular hypertrophy -ED documented issues with suboptimal blood pressure acquiring 500 mL saline bolus -Hold preadmission Norvasc, Prinzide, and Aldactone -Lopressor decreased to minimal dosage to avoid rebound tachycardia, blood pressure well controlled on this lower dose   Diabetes mellitus type 2, insulin dependent  -Followed by endocrinology as an outpatient with documented issues with over control in hypoglycemia in the  outpatient setting -hold amaryl and victoza -Follow CBGs and provide SSI -A1c 8.3%.  -dc amaryl, resume other diabetes meds at discharge  HLD (  hyperlipidemia) -Pravastatin discontinued one week ago by PCP -Continue fenofibrate  Fibromyalgia -No change in baseline chronic pain -Continue Neurontin, Abilify, Cymbalta, Robaxin, Lyrica and Requip  History of Cough syncope -Has not had any episodes since prior hospitalization for this issue   DVT prophylaxis:Lovenox Code Status:Full Family Communication:Wife at bedside Disposition Plan:HOME Consults called:None Admission status:Observation/telemetry  Discharge Diagnoses:  Principal Problem:   Acute on (presumed) chronic respiratory failure with hypoxia ([place]) Active Problems:   HLD (hyperlipidemia)   HTN (hypertension)   CAD, NATIVE VESSEL (40-50% first diag LAD in 2012)   OSA (obstructive sleep apnea) noncompliant w/ CPAP   Chest pain   Diabetes mellitus type 2, insulin dependent (HCC)   LVH (left ventricular hypertrophy)   Elevated CK   Fibromyalgia   History of Cough syncope  Discharge Instructions  Discharge Instructions    Diet - low sodium heart healthy    Complete by:  As directed   Increase activity slowly    Complete by:  As directed       Medication List    STOP taking these medications   ALEVE 220 MG tablet Generic drug:  naproxen sodium   amLODipine 10 MG tablet Commonly known as:  NORVASC   amLODipine 5 MG tablet Commonly known as:  NORVASC   gabapentin 800 MG tablet Commonly known as:  NEURONTIN   glimepiride 4 MG tablet Commonly known as:  AMARYL   lisinopril-hydrochlorothiazide 20-12.5 MG tablet Commonly known as:  PRINZIDE,ZESTORETIC   spironolactone 25 MG tablet Commonly known as:  ALDACTONE     TAKE these medications   ACCU-CHEK COMPACT PLUS test strip Generic drug:  glucose blood   ONETOUCH VERIO test strip Generic drug:  glucose blood use TO CHECK BLOOD  SUGAR three times a day   ARIPiprazole 5 MG tablet Commonly known as:  ABILIFY Take 5 mg by mouth daily.   aspirin 325 MG tablet Take 325 mg by mouth daily.   DULoxetine 60 MG capsule Commonly known as:  CYMBALTA Take 60 mg by mouth 2 (two) times daily.   fenofibrate 160 MG tablet Take 160 mg by mouth daily.   Insulin Glargine 300 UNIT/ML Sopn Commonly known as:  TOUJEO SOLOSTAR Inject 250 Units into the skin daily. Every morning. What changed:  when to take this  additional instructions   Insulin Pen Needle 31G X 8 MM Misc Use to inject 2 times per day   Liraglutide 18 MG/3ML Sopn Commonly known as:  VICTOZA Inject 0.4 mLs (2.4 mg total) into the skin daily. What changed:  how much to take  when to take this   metFORMIN 500 MG 24 hr tablet Commonly known as:  GLUCOPHAGE-XR take 4 tablets by mouth once daily WITH SUPPER   methocarbamol 500 MG tablet Commonly known as:  ROBAXIN Take 500-1,000 mg by mouth See admin instructions. Take 1 tablet (500 mg) by mouth every morning, take 1 tablet 8 hours later and then 2 tablets 8 hours after 2nd dose   metoprolol tartrate 25 MG tablet Commonly known as:  LOPRESSOR Take 1 tablet (25 mg total) by mouth 2 (two) times daily. What changed:  medication strength  how much to take  when to take this   ONETOUCH VERIO IQ SYSTEM w/Device Kit Use to check blood sugar 3 times per day.   pantoprazole 40 MG tablet Commonly known as:  PROTONIX Take 40 mg by mouth 2 (two) times daily.   pregabalin 150 MG capsule Commonly known as:  LYRICA  Take 150 mg by mouth 3 (three) times daily.   rOPINIRole 1 MG tablet Commonly known as:  REQUIP Take 1 mg by mouth at bedtime.      Follow-up Information    ANDERSON,TERESA, FNP. Schedule an appointment as soon as possible for a visit in 4 day(s).   Specialty:  Nurse Practitioner Why:  Hospital Follow Up recheck labs Contact information: [address] [place]  [place] [address] [phone]          Allergies  Allergen Reactions  . Penicillins Other (See Comments)    Unknown childhood allergic reaction - no other information available    Procedures/Studies: Dg Chest 2 View  Result Date: 02/08/2016 CLINICAL DATA:  Pt reports increased SOB with intermittent left side CP x 2.5 months; he reports abnormal labs and his Dr's office sent him here; he reports h/o DM, HTN, and cardiac cath; former smoker EXAM: CHEST  2 VIEW COMPARISON:  09/22/2005 FINDINGS: Cardiac silhouette is normal in size. The aorta is uncoiled and tortuous. No mediastinal or hilar masses or evidence of adenopathy. Clear lungs.  No pleural effusion or pneumothorax. Skeletal structures are demineralized intact. IMPRESSION: No active cardiopulmonary disease. Electronically Signed   By: Lajean Manes M.D.   On: 02/08/2016 12:22   Nm Myocar Multi W/spect W/wall Motion / Ef  Result Date: 02/09/2016 CLINICAL DATA:  Chest pain, shortness of breath and elevated CK level EXAM: MYOCARDIAL IMAGING WITH SPECT (REST AND PHARMACOLOGIC-STRESS) GATED LEFT VENTRICULAR WALL MOTION STUDY LEFT VENTRICULAR EJECTION FRACTION TECHNIQUE: Standard myocardial SPECT imaging was performed after resting intravenous injection of 10 mCi Tc-31mtetrofosmin. Subsequently, intravenous infusion of Lexiscan was performed under the supervision of the Cardiology staff. At peak effect of the drug, 30 mCi Tc-912metrofosmin was injected intravenously and standard myocardial SPECT imaging was performed. Quantitative gated imaging was also performed to evaluate left ventricular wall motion, and estimate left ventricular ejection fraction. COMPARISON:  None. FINDINGS: Perfusion: There is evidence of inferolateral scar without visualized inducible myocardial ischemia. Wall Motion: Mild inferior and septal hypokinesis. Left Ventricular Ejection Fraction: 54 % End diastolic volume 10270l End systolic volume 51 ml IMPRESSION: 1. Inferolateral  scar without evidence of ischemia. 2. Mild inferior and septal hypokinesis. 3. Left ventricular ejection fraction 54% 4. Non invasive risk stratification*: Low *2012 Appropriate Use Criteria for Coronary Revascularization Focused Update: J Am Coll Cardiol. 206237;62(8):[email or web address] Electronically Signed   By: GlAletta Edouard.D.   On: 02/09/2016 15:43     Subjective: Pt says he feels a lot better.  No chest pain, no SOB.    Discharge Exam: Vitals:   02/09/16 2039 02/10/16 0435  BP: 120/71 123/81  Pulse: 80 73  Resp: 20 20  Temp: 98.1 F (36.7 C) 98.1 F (36.7 C)   Vitals:   02/09/16 1109 02/09/16 1216 02/09/16 2039 02/10/16 0435  BP: 129/68 138/68 120/71 123/81  Pulse: 86 68 80 73  Resp:  _0 Temp:  97.7 F (36.5 C) 98.1 F (36.7 C) 98.1 F (36.7 C)  TempSrc:  Oral Oral Oral  SpO2:  98% 96% 97%  Weight:    131.1 kg (289 lb 1.6 oz)  Height:       General exam: awake, alert, no distress, comfortable Respiratory system: Clear. No increased work of breathing. Cardiovascular system: S1 & S2 heard, RRR. No JVD, murmurs, gallops, clicks or pedal edema. Gastrointestinal system: Abdomen is nondistended, soft and nontender. Normal bowel sounds heard. Central nervous system:  Alert and oriented. No focal neurological deficits. Extremities: no CCE.   The results of significant diagnostics from this hospitalization (including imaging, microbiology, ancillary and laboratory) are listed below for reference.     Microbiology: No results found for this or any previous visit (from the past 240 hour(s)).   Labs: BNP (last 3 results)  Recent Labs  02/08/16 1633  BNP 95.2   Basic Metabolic Panel:  Recent Labs Lab 02/08/16 1131 02/10/16 0417  NA 135 138  K 4.3 3.7  CL 105 105  CO2 23 26  GLUCOSE 121* 113*  BUN 11 11  CREATININE 0.99 1.00  CALCIUM 9.3 8.5*   Liver Function Tests:  Recent Labs Lab  02/08/16 1444 02/10/16 0417  AST 60* 44*  ALT 50 53  ALKPHOS 31* 37*  BILITOT 0.8 0.6  PROT 5.5* 6.0*  ALBUMIN 3.5 3.6   No results for input(s): LIPASE, AMYLASE in the last 168 hours. No results for input(s): AMMONIA in the last 168 hours. CBC:  Recent Labs Lab 02/08/16 1131 02/10/16 0417  WBC 5.2 5.9  HGB 15.9 15.2  HCT 49.4 47.0  MCV 86.7 87.4  PLT 154 148*   Cardiac Enzymes:  Recent Labs Lab 02/08/16 1131 02/08/16 1444 02/08/16 2025 02/09/16 0102 02/09/16 0226 02/09/16 0800 02/10/16 0417  CKTOTAL 1,592*  --   --   --  1,004*  --  612*  CKMB 9.4*  --   --   --   --   --   --   TROPONINI  --  <0.03 <0.03 <0.03  --  <0.03  --    BNP: Invalid input(s): POCBNP CBG:  Recent Labs Lab 02/09/16 0636 02/09/16 1222 02/09/16 1624 02/09/16 2213 02/10/16 0653  GLUCAP 153* 150* 169* 210* 127*   D-Dimer  Recent Labs  02/08/16 1300  DDIMER 0.42   Hgb A1c  Recent Labs  02/08/16 1633  HGBA1C 8.3*   Lipid Profile No results for input(s): CHOL, HDL, LDLCALC, TRIG, CHOLHDL, LDLDIRECT in the last 72 hours. Thyroid function studies  Recent Labs  02/08/16 1633  TSH 0.399   Anemia work up No results for input(s): VITAMINB12, FOLATE, FERRITIN, TIBC, IRON, RETICCTPCT in the last 72 hours. Urinalysis No results found for: COLORURINE, APPEARANCEUR, LABSPEC, [place], GLUCOSEU, HGBUR, BILIRUBINUR, KETONESUR, PROTEINUR, UROBILINOGEN, NITRITE, LEUKOCYTESUR Sepsis Labs Invalid input(s): PROCALCITONIN,  WBC,  LACTICIDVEN Microbiology No results found for this or any previous visit (from the past 240 hour(s)).  Time coordinating discharge: 30 minutes  SIGNED:  Irwin Brakeman, MD  [facility] 02/10/2016, 8:51 AM Pager   If 7PM-7AM, please contact night-coverage [email or web address] Password [email or web address]

## 2016-02-11 LAB — VITAMIN D 25 HYDROXY (VIT D DEFICIENCY, FRACTURES): VIT D 25 HYDROXY: 10.3 ng/mL — AB (ref 30.0–100.0)

## 2016-02-27 ENCOUNTER — Encounter: Payer: Self-pay | Admitting: Interventional Cardiology

## 2016-03-11 DIAGNOSIS — Z789 Other specified health status: Secondary | ICD-10-CM | POA: Insufficient documentation

## 2016-03-12 ENCOUNTER — Ambulatory Visit: Payer: Commercial Managed Care - PPO | Admitting: Interventional Cardiology

## 2016-03-12 DIAGNOSIS — T466X5A Adverse effect of antihyperlipidemic and antiarteriosclerotic drugs, initial encounter: Secondary | ICD-10-CM | POA: Insufficient documentation

## 2016-03-12 DIAGNOSIS — M6282 Rhabdomyolysis: Secondary | ICD-10-CM | POA: Insufficient documentation

## 2016-03-16 ENCOUNTER — Ambulatory Visit: Payer: Commercial Managed Care - PPO | Admitting: Endocrinology

## 2016-03-19 ENCOUNTER — Other Ambulatory Visit: Payer: Self-pay | Admitting: Endocrinology

## 2016-03-20 NOTE — Telephone Encounter (Signed)
Please refill x 1 Ov is due  

## 2016-03-23 ENCOUNTER — Encounter: Payer: Self-pay | Admitting: Endocrinology

## 2016-03-23 ENCOUNTER — Ambulatory Visit (INDEPENDENT_AMBULATORY_CARE_PROVIDER_SITE_OTHER): Payer: Commercial Managed Care - PPO | Admitting: Endocrinology

## 2016-03-23 VITALS — BP 148/82 | HR 82 | Ht 70.0 in | Wt 295.0 lb

## 2016-03-23 DIAGNOSIS — E119 Type 2 diabetes mellitus without complications: Secondary | ICD-10-CM

## 2016-03-23 DIAGNOSIS — Z794 Long term (current) use of insulin: Secondary | ICD-10-CM | POA: Diagnosis not present

## 2016-03-23 LAB — POCT GLYCOSYLATED HEMOGLOBIN (HGB A1C): HEMOGLOBIN A1C: 8.2

## 2016-03-23 MED ORDER — INSULIN LISPRO 100 UNIT/ML (KWIKPEN): 30.0000 [IU] | PEN_INJECTOR | Freq: Every day | SUBCUTANEOUS | 11 refills | Status: DC

## 2016-03-23 MED ORDER — BASAGLAR KWIKPEN 100 UNIT/ML ~~LOC~~ SOPN: 250.0000 [IU] | PEN_INJECTOR | SUBCUTANEOUS | 11 refills | Status: DC

## 2016-03-23 NOTE — Progress Notes (Signed)
 Subjective:    Patient ID: Grant Williams, male    DOB: 10/28/1955, 59 y.o.   MRN: [id number]  HPI Pt returns for f/u of diabetes mellitus:  DM type: Insulin-requiring type 2. Dx'ed: 2008.   Complications: polyneuropathy, nephropathy, and CAD.   Therapy: insulin since 2015 (he also takes victoza and metformin).   DKA: never.  Severe hypoglycemia: never Pancreatitis: never.    Other: He declines multiple daily injections; he is working towards weight loss surgery.   Interval history: no cbg record, but states cbg's vary from 147-310.  There is no trend throughout the day. pt states he feels well in general.    Past Medical History:  Diagnosis Date  . Cough syncope   . Diabetes mellitus without complication (HCC)   . Fibromyalgia   . Hypertension     Past Surgical History:  Procedure Laterality Date  . ELBOW SURGERY Right   . HERNIA REPAIR    . SHOULDER ARTHROSCOPY Right     Social History   Social History  . Marital status: Married    Spouse name: N/A  . Number of children: N/A  . Years of education: N/A   Occupational History  . Not on file.   Social History Main Topics  . Smoking status: Former Smoker  . Smokeless tobacco: Current User    Types: Chew     Comment: 30 years ago  . Alcohol use No  . Drug use: No  . Sexual activity: Not Currently   Other Topics Concern  . Not on file   Social History Narrative  . No narrative on file    Current Outpatient Prescriptions on File Prior to Visit  Medication Sig Dispense Refill  . ARIPiprazole (ABILIFY) 5 MG tablet Take 5 mg by mouth daily.     . aspirin 325 MG tablet Take 325 mg by mouth daily.    . Blood Glucose Monitoring Suppl (ONETOUCH VERIO IQ SYSTEM) W/DEVICE KIT Use to check blood sugar 3 times per day. 1 kit 0  . DULoxetine (CYMBALTA) 60 MG capsule Take 60 mg by mouth 2 (two) times daily.     . fenofibrate 160 MG tablet Take 160 mg by mouth daily.     . Insulin Pen Needle 31G X 8 MM MISC Use to  inject 2 times per day 100 each 2  . metFORMIN (GLUCOPHAGE-XR) 500 MG 24 hr tablet take 4 tablets by mouth once daily WITH SUPPER (Patient taking differently: take 4 tablets by mouth once daily with supper) 120 tablet 3  . methocarbamol (ROBAXIN) 500 MG tablet Take 500-1,000 mg by mouth See admin instructions. Take 1 tablet (500 mg) by mouth every morning, take 1 tablet 8 hours later and then 2 tablets 8 hours after 2nd dose    . metoprolol (LOPRESSOR) 25 MG tablet Take 1 tablet (25 mg total) by mouth 2 (two) times daily. 60 tablet 0  . ONETOUCH VERIO test strip use TO CHECK BLOOD SUGAR three times a day 100 each 4  . pantoprazole (PROTONIX) 40 MG tablet Take 40 mg by mouth 2 (two) times daily.     . pregabalin (LYRICA) 150 MG capsule Take 150 mg by mouth 3 (three) times daily.    . rOPINIRole (REQUIP) 1 MG tablet Take 1 mg by mouth at bedtime.     . VICTOZA 18 MG/3ML SOPN INJECT 0,4 MILLILITERS INTO THE SKIN DAILY 12 pen 0   No current facility-administered medications on file prior to visit.       Allergies  Allergen Reactions  . Penicillins Other (See Comments)    Unknown childhood allergic reaction - no other information available    Family History  Problem Relation Age of Onset  . Diabetes Father     BP (!) 148/82   Pulse 82   Ht 5' 10" (1.778 m)   Wt 295 lb (133.8 kg)   SpO2 98%   BMI 42.33 kg/m    Review of Systems He denies hypoglycemia.      Objective:   Physical Exam VITAL SIGNS:  See vs page GENERAL: no distress Pulses: dorsalis pedis intact bilat.  MSK: no deformity of the feet CV: 1+ bilat leg edema Skin: no ulcer on the feet. normal color and temp on the feet, but there is spotty hyperpigmentation of the legs.   Neuro: sensation is intact to touch on the feet, but decreased from normal.   Lab Results  Component Value Date   HGBA1C 8.2 03/23/2016      Assessment & Plan:  Insulin-requiring type 2 DM: he needs increased rx Patient is advised the  following: Patient Instructions  check your blood sugar twice a day.  vary the time of day when you check, between before the 3 meals, and at bedtime.  also check if you have symptoms of your blood sugar being too high or too low.  please keep a record of the readings and bring it to your next appointment here.  You can write it on any piece of paper.  please call us sooner if your blood sugar goes below 70, or if you have a lot of readings over 200.  Please come back for a follow-up appointment in 1 month.   Please change the toujeo to "basaglar," 250 units each morning, and: Add humalog, 30 units with supper.

## 2016-03-23 NOTE — Patient Instructions (Addendum)
check your blood sugar twice a day.  vary the time of day when you check, between before the 3 meals, and at bedtime.  also check if you have symptoms of your blood sugar being too high or too low.  please keep a record of the readings and bring it to your next appointment here.  You can write it on any piece of paper.  please call us sooner if your blood sugar goes below 70, or if you have a lot of readings over 200.  Please come back for a follow-up appointment in 1 month.   Please change the toujeo to "basaglar," 250 units each morning, and: Add humalog, 30 units with supper.

## 2016-03-25 ENCOUNTER — Telehealth: Payer: Self-pay | Admitting: Endocrinology

## 2016-03-25 NOTE — Telephone Encounter (Signed)
Pt wife called, she stated that the other day when they were in for the appointment, Dr. Everardo AllEllison [facility] and also added on the Humulog.  Pt just received a whole month's supply of Toujeo before this and was wondering is it okay to just finish out the month (since it was already paid for) and go ahead and start the Humulog as well?  Or does he need to wait to start Humulog when he starts the Basaglar.   Please advise, wife's call back # [phone]

## 2016-03-26 NOTE — Telephone Encounter (Signed)
See message and please advise, Thanks!  

## 2016-03-26 NOTE — Telephone Encounter (Signed)
I contacted the patient's wife and advised of message via voicemail. Requested a call back if she would like to discuss.

## 2016-03-26 NOTE — Telephone Encounter (Signed)
Patient wife called and have question about medication.

## 2016-03-26 NOTE — Telephone Encounter (Signed)
The basaglar and toujeo are interchangeable.  Please continue the same humalog, along with either toujeo or basaglar.

## 2016-03-27 ENCOUNTER — Telehealth: Payer: Self-pay | Admitting: Cardiovascular Disease

## 2016-03-27 NOTE — Telephone Encounter (Signed)
[facility] with Dr. Allyson SabalBerry on 04/03/2016 at 12:15. [facility] 03/27/2016 [facility]

## 2016-03-31 NOTE — Telephone Encounter (Signed)
Requested a call back from the patient's wife to discuss.  

## 2016-04-03 ENCOUNTER — Ambulatory Visit (INDEPENDENT_AMBULATORY_CARE_PROVIDER_SITE_OTHER): Payer: Commercial Managed Care - PPO | Admitting: Cardiovascular Disease

## 2016-04-03 ENCOUNTER — Encounter: Payer: Self-pay | Admitting: Cardiovascular Disease

## 2016-04-03 ENCOUNTER — Ambulatory Visit: Payer: Commercial Managed Care - PPO | Admitting: Cardiovascular Disease

## 2016-04-03 ENCOUNTER — Encounter: Payer: Self-pay | Admitting: *Deleted

## 2016-04-03 DIAGNOSIS — I10 Essential (primary) hypertension: Secondary | ICD-10-CM | POA: Diagnosis not present

## 2016-04-03 DIAGNOSIS — G4733 Obstructive sleep apnea (adult) (pediatric): Secondary | ICD-10-CM

## 2016-04-03 DIAGNOSIS — I2510 Atherosclerotic heart disease of native coronary artery without angina pectoris: Secondary | ICD-10-CM

## 2016-04-03 DIAGNOSIS — E7800 Pure hypercholesterolemia, unspecified: Secondary | ICD-10-CM | POA: Diagnosis not present

## 2016-04-03 NOTE — Patient Instructions (Signed)
Medication Instructions:  NO CHANGES.   Follow-Up: Your physician recommends that you schedule a follow-up appointment in: AS NEEDED.      

## 2016-04-03 NOTE — Progress Notes (Signed)
04/03/2016 Grant Williams   04/21/1956  [id number]  Primary Physician Vicenta Aly, [place] Primary Cardiologist: Lorretta Harp MD Renae Gloss  HPI:  [name] is a 60 year old severely overweight married Caucasian male father of 2, grandfather 3 grandchildren was accompanied by his wife Grant Williams. He was referred by Nonda Lou, nurse practitioner, for cardiovascular evaluation and clearance for his planned Roux-en-Y gastric bypass operation early next year. History of factors include treated hypertension, diabetes and hyperlipidemia. He has never had a heart attack or stroke. He denies chest pain but does get some dyspnea probably related to obesity. 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 Does have sleep apnea on BiPAP. He has seen Dr. Julianne Handler in the past and has had cardiac catheterization 2002 revealing moderate diagonal branch disease treated medically. He was hospitalized because of elevated CPK of unknown etiology. A 2-D echo was essentially normal and a Myoview stress test showed inferolateral scar without ischemia thought to be right low risk.   Current Outpatient Prescriptions  Medication Sig Dispense Refill  . ARIPiprazole (ABILIFY) 5 MG tablet Take 5 mg by mouth daily.     Marland Kitchen aspirin 325 MG tablet Take 325 mg by mouth daily.    . Blood Glucose Monitoring Suppl (ONETOUCH VERIO IQ SYSTEM) W/DEVICE KIT Use to check blood sugar 3 times per day. 1 kit 0  . diphenhydrAMINE (SOMINEX) 25 MG tablet Take 1 tablet by mouth as directed.    . DULoxetine (CYMBALTA) 60 MG capsule Take 60 mg by mouth 2 (two) times daily.     . fenofibrate 160 MG tablet Take 160 mg by mouth daily.     Marland Kitchen gabapentin (NEURONTIN) 400 MG capsule Take 800 mg by mouth 4 (four) times daily.     . hydrochlorothiazide (HYDRODIURIL) 12.5 MG tablet Take 1 tablet by mouth daily.    . Insulin Glargine 300 UNIT/ML SOPN Inject 250 Units into the skin as directed.    . insulin lispro (HUMALOG  KWIKPEN) 100 UNIT/ML KiwkPen Inject 0.3 mLs (30 Units total) into the skin daily with supper. And pen needles 6/day 15 mL 11  . Insulin Pen Needle 31G X 8 MM MISC Use to inject 2 times per day 100 each 2  . metFORMIN (GLUCOPHAGE-XR) 500 MG 24 hr tablet take 4 tablets by mouth once daily WITH SUPPER (Patient taking differently: take 4 tablets by mouth once daily with supper) 120 tablet 3  . methocarbamol (ROBAXIN) 500 MG tablet Take 500-1,000 mg by mouth See admin instructions. Take 1 tablet (500 mg) by mouth every morning, take 1 tablet 8 hours later and then 2 tablets 8 hours after 2nd dose    . metoprolol (LOPRESSOR) 100 MG tablet Take 100 mg by mouth daily.    . naproxen sodium (ANAPROX) 220 MG tablet Take 1 tablet by mouth as directed.    Glory Rosebush VERIO test strip use TO CHECK BLOOD SUGAR three times a day 100 each 4  . pantoprazole (PROTONIX) 40 MG tablet Take 40 mg by mouth 2 (two) times daily.     . pregabalin (LYRICA) 150 MG capsule Take 150 mg by mouth 3 (three) times daily.    . quinapril (ACCUPRIL) 40 MG tablet Take 1 tablet by mouth at bedtime.    Marland Kitchen rOPINIRole (REQUIP) 1 MG tablet Take 1 mg by mouth at bedtime.     Marland Kitchen VICTOZA 18 MG/3ML SOPN INJECT 0,4 MILLILITERS INTO THE SKIN DAILY 12 pen 0  No current facility-administered medications for this visit.     Allergies  Allergen Reactions  . Penicillins Other (See Comments)    Unknown childhood allergic reaction - no other information available    Social History   Social History  . Marital status: Married    Spouse name: N/A  . Number of children: N/A  . Years of education: N/A   Occupational History  . Not on file.   Social History Main Topics  . Smoking status: Former Research scientist (life sciences)  . Smokeless tobacco: Current User    Types: Chew     Comment: 30 years ago  . Alcohol use No  . Drug use: No  . Sexual activity: Not Currently   Other Topics Concern  . Not on file   Social History Narrative  . No narrative on file       Review of Systems: General: negative for chills, fever, night sweats or weight changes.  Cardiovascular: negative for chest pain, dyspnea on exertion, edema, orthopnea, palpitations, paroxysmal nocturnal dyspnea or shortness of breath Dermatological: negative for rash Respiratory: negative for cough or wheezing Urologic: negative for hematuria Abdominal: negative for nausea, vomiting, diarrhea, bright red blood per rectum, melena, or hematemesis Neurologic: negative for visual changes, syncope, or dizziness All other systems reviewed and are otherwise negative except as noted above.    Blood pressure (!) 171/101, pulse 78, height _0  (1.753 m), weight 293 lb 9.6 oz (133.2 kg), SpO2 95 %.  General appearance: alert and no distress Neck: no adenopathy, no carotid bruit, no JVD, supple, symmetrical, trachea midline and thyroid not enlarged, symmetric, no tenderness/mass/nodules Lungs: clear to auscultation bilaterally Heart: regular rate and rhythm, S1, S2 normal, no murmur, click, rub or gallop Extremities: extremities normal, atraumatic, no cyanosis or edema  EKG not performed today  ASSESSMENT AND PLAN:   HLD (hyperlipidemia) History of hyperlipidemia on fenofibrate followed by his PCP  HTN (hypertension) History of hypertension blood pressure measured at 171/101. He is on metoprolol and quinapril.  CAD, NATIVE VESSEL (40-50% first diag LAD in 2012) History of CAD status post cardiac catheterization in 2012 revealing minimal diagonal branch disease.  OSA (obstructive sleep apnea) noncompliant w/ CPAP Obstructive sleep apnea on BiPAP      Lorretta Harp MD [facility], [facility] 04/03/2016 12:37 PM

## 2016-04-03 NOTE — Assessment & Plan Note (Signed)
History of hypertension blood pressure measured at 171/101. He is on metoprolol and quinapril.

## 2016-04-03 NOTE — Assessment & Plan Note (Signed)
History of hyperlipidemia on fenofibrate followed by his PCP 

## 2016-04-03 NOTE — Assessment & Plan Note (Signed)
History of CAD status post cardiac catheterization in 2012 revealing minimal diagonal branch disease.

## 2016-04-03 NOTE — Assessment & Plan Note (Signed)
Obstructive sleep apnea on BiPAP 

## 2016-04-17 ENCOUNTER — Other Ambulatory Visit: Payer: Self-pay | Admitting: Endocrinology

## 2016-04-22 NOTE — Telephone Encounter (Signed)
Closed encounter °

## 2016-04-27 ENCOUNTER — Encounter: Payer: Self-pay | Admitting: Endocrinology

## 2016-04-27 ENCOUNTER — Ambulatory Visit (INDEPENDENT_AMBULATORY_CARE_PROVIDER_SITE_OTHER): Payer: Commercial Managed Care - PPO | Admitting: Endocrinology

## 2016-04-27 VITALS — BP 146/94 | HR 77 | Ht 69.0 in | Wt 298.0 lb

## 2016-04-27 DIAGNOSIS — Z794 Long term (current) use of insulin: Secondary | ICD-10-CM

## 2016-04-27 DIAGNOSIS — E119 Type 2 diabetes mellitus without complications: Secondary | ICD-10-CM | POA: Diagnosis not present

## 2016-04-27 DIAGNOSIS — I2510 Atherosclerotic heart disease of native coronary artery without angina pectoris: Secondary | ICD-10-CM

## 2016-04-27 LAB — POCT GLYCOSYLATED HEMOGLOBIN (HGB A1C): HEMOGLOBIN A1C: 7.6

## 2016-04-27 NOTE — Patient Instructions (Addendum)
check your blood sugar twice a day.  vary the time of day when you check, between before the 3 meals, and at bedtime.  also check if you have symptoms of your blood sugar being too high or too low.  please keep a record of the readings and bring it to your next appointment here.  You can write it on any piece of paper.  please call us sooner if your blood sugar goes below 70, or if you have a lot of readings over 200.  Please come back for a follow-up appointment in 3 months.   Please reduce the basaglar to 200 units each morning, and:  Reduce the humalog to 20 units with supper.   Please call if you have lows on the pre-surgery diet, so we can reduce the insulin further.

## 2016-04-27 NOTE — Progress Notes (Signed)
Subjective:    Patient ID: Grant Williams, male    DOB: [birth year], 60 y.o.   MRN: [id number]  HPI Pt returns for f/u of diabetes mellitus:  DM type: Insulin-requiring type 2. Dx'ed: 2008.   Complications: polyneuropathy, nephropathy, and CAD.   Therapy: insulin since 2015 (he also takes victoza and metformin).   DKA: never.  Severe hypoglycemia: never Pancreatitis: never.    Other: He declines multiple daily injections; he is working towards weight loss surgery.   Interval history: He brings a record of his cbg's which I have reviewed today.  It varies from 99-200.  There is no trend throughout the day. pt states he feels well in general.  He will have weight loss surgery in a few months, and is about to go on the pre-bariatric surg diet.  Past Medical History:  Diagnosis Date  . Coronary artery disease   . Cough syncope   . Diabetes mellitus without complication ([place])   . Fibromyalgia   . HTN (hypertension)   . Hyperlipidemia   . Hypertension   . Morbid obesity ([place])   . SOB (shortness of breath)   . Uncontrolled type 2 diabetes mellitus with complication ([place])   . Vitamin B12 deficiency     Past Surgical History:  Procedure Laterality Date  . ELBOW SURGERY Right   . HERNIA REPAIR    . SHOULDER ARTHROSCOPY Right     Social History   Social History  . Marital status: Married    Spouse name: N/A  . Number of children: N/A  . Years of education: N/A   Occupational History  . Not on file.   Social History Main Topics  . Smoking status: Former Research scientist (life sciences)  . Smokeless tobacco: Current User    Types: Chew     Comment: 30 years ago  . Alcohol use No  . Drug use: No  . Sexual activity: Not Currently   Other Topics Concern  . Not on file   Social History Narrative  . No narrative on file    Current Outpatient Prescriptions on File Prior to Visit  Medication Sig Dispense Refill  . ARIPiprazole (ABILIFY) 5 MG tablet Take 5 mg by mouth daily.     . Blood  Glucose Monitoring Suppl (ONETOUCH VERIO IQ SYSTEM) W/DEVICE KIT Use to check blood sugar 3 times per day. 1 kit 0  . diphenhydrAMINE (SOMINEX) 25 MG tablet Take 1 tablet by mouth as directed.    . DULoxetine (CYMBALTA) 60 MG capsule Take 60 mg by mouth 2 (two) times daily.     . fenofibrate 160 MG tablet Take 160 mg by mouth daily.     Marland Kitchen gabapentin (NEURONTIN) 400 MG capsule Take 800 mg by mouth 4 (four) times daily.     . hydrochlorothiazide (HYDRODIURIL) 12.5 MG tablet Take 1 tablet by mouth daily.    . Insulin Glargine 300 UNIT/ML SOPN Inject 250 Units into the skin as directed.    . insulin lispro (HUMALOG KWIKPEN) 100 UNIT/ML KiwkPen Inject 0.3 mLs (30 Units total) into the skin daily with supper. And pen needles 6/day 15 mL 11  . Insulin Pen Needle 31G X 8 MM MISC Use to inject 2 times per day 100 each 2  . metFORMIN (GLUCOPHAGE-XR) 500 MG 24 hr tablet take 4 tablets by mouth once daily WITH SUPPER (Patient taking differently: take 4 tablets by mouth once daily with supper) 120 tablet 3  . methocarbamol (ROBAXIN) 500 MG tablet Take 500-1,000 mg  by mouth See admin instructions. Take 1 tablet (500 mg) by mouth every morning, take 1 tablet 8 hours later and then 2 tablets 8 hours after 2nd dose    . metoprolol (LOPRESSOR) 100 MG tablet Take 100 mg by mouth daily.    Glory Rosebush VERIO test strip use TO CHECK BLOOD SUGAR three times a day 100 each 4  . pantoprazole (PROTONIX) 40 MG tablet Take 40 mg by mouth 2 (two) times daily.     . pregabalin (LYRICA) 150 MG capsule Take 150 mg by mouth 3 (three) times daily.    . quinapril (ACCUPRIL) 40 MG tablet Take 1 tablet by mouth at bedtime.    Marland Kitchen rOPINIRole (REQUIP) 1 MG tablet Take 1 mg by mouth at bedtime.     Marland Kitchen VICTOZA 18 MG/3ML SOPN INJECT 0.4 MILLILITERS SUBCUTANEOUSLY DAILY 12 pen 3   No current facility-administered medications on file prior to visit.     Allergies  Allergen Reactions  . Penicillins Other (See Comments)    Unknown childhood  allergic reaction - no other information available   Family History  Problem Relation Age of Onset  . Diabetes Father    BP (!) 146/94   Pulse 77   Ht _0  (1.753 m)   Wt 298 lb (135.2 kg)   SpO2 96%   BMI 44.01 kg/m   Review of Systems He denies hypoglycemia.      Objective:   Physical Exam VITAL SIGNS:  See vs page GENERAL: no distress Pulses: dorsalis pedis intact bilat.  MSK: no deformity of the feet CV: trace bilat leg edema Skin: no ulcer on the feet. normal color and temp on the feet, but there is spotty hyperpigmentation of the legs.   Neuro: sensation is intact to touch on the feet, but decreased from normal.    A1c=7.6%    Assessment & Plan:  Insulin-requiring type 2, with CAD: this is the best control this pt should aim for, given this regimen, which does match insulin to his changing needs throughout the day Obesity, worse: he needs to reduce insulin for pre-surg diet.   Patient is advised the following: Patient Instructions  check your blood sugar twice a day.  vary the time of day when you check, between before the 3 meals, and at bedtime.  also check if you have symptoms of your blood sugar being too high or too low.  please keep a record of the readings and bring it to your next appointment here.  You can write it on any piece of paper.  please call us sooner if your blood sugar goes below 70, or if you have a lot of readings over 200.  Please come back for a follow-up appointment in 3 months.   Please reduce the basaglar to 200 units each morning, and:  Reduce the humalog to 20 units with supper.   Please call if you have lows on the pre-surgery diet, so we can reduce the insulin further.

## 2016-05-14 ENCOUNTER — Other Ambulatory Visit: Payer: Self-pay | Admitting: Endocrinology

## 2016-07-02 ENCOUNTER — Other Ambulatory Visit: Payer: Self-pay | Admitting: Endocrinology

## 2016-07-14 ENCOUNTER — Telehealth: Payer: Self-pay | Admitting: Endocrinology

## 2016-07-14 NOTE — Telephone Encounter (Signed)
Patient wife stated patient need clearence for bariatric surgery, fax to her home [phone]

## 2016-07-14 NOTE — Telephone Encounter (Signed)
See message and please advise, Thanks!  

## 2016-07-14 NOTE — Telephone Encounter (Signed)
What does this refer to:  Medical clearance, or letter of medical necessity?

## 2016-07-15 NOTE — Telephone Encounter (Signed)
I contacted the patient and advised of message via voicemail. Requested a call back from the patient to verify if he needs the medical clearance or if it's a letter of medical necessity.

## 2016-07-28 ENCOUNTER — Ambulatory Visit (INDEPENDENT_AMBULATORY_CARE_PROVIDER_SITE_OTHER): Payer: Commercial Managed Care - PPO | Admitting: Endocrinology

## 2016-07-28 ENCOUNTER — Encounter: Payer: Self-pay | Admitting: Endocrinology

## 2016-07-28 VITALS — BP 132/86 | HR 92 | Ht 69.0 in | Wt 293.0 lb

## 2016-07-28 DIAGNOSIS — E119 Type 2 diabetes mellitus without complications: Secondary | ICD-10-CM

## 2016-07-28 DIAGNOSIS — Z794 Long term (current) use of insulin: Secondary | ICD-10-CM

## 2016-07-28 LAB — POCT GLYCOSYLATED HEMOGLOBIN (HGB A1C): HEMOGLOBIN A1C: 7.1

## 2016-07-28 MED ORDER — LIRAGLUTIDE 18 MG/3ML ~~LOC~~ SOPN: 1.8000 mg | PEN_INJECTOR | Freq: Every day | SUBCUTANEOUS | 3 refills | Status: DC

## 2016-07-28 MED ORDER — INSULIN GLARGINE 300 UNIT/ML ~~LOC~~ SOPN: 180.0000 [IU] | PEN_INJECTOR | SUBCUTANEOUS | 6 refills | Status: DC

## 2016-07-28 NOTE — Progress Notes (Signed)
Subjective:    Patient ID: Grant Williams, male    DOB: 21-Jan-1956, 61 y.o.   MRN: [id number]  HPI Pt returns for f/u of diabetes mellitus:  DM type: Insulin-requiring type 2. Dx'ed: 2008.   Complications: polyneuropathy, nephropathy, and CAD.   Therapy: insulin since 2015 (he also takes victoza and metformin).   DKA: never.  Severe hypoglycemia: never Pancreatitis: never.    Other: He declines multiple daily injections; he is working towards weight loss surgery.   Interval history: He brings a record of his cbg's which I have reviewed today.  It varies from 90-200.  There is no trend throughout the day. pt states he feels well in general.  He will have weight loss surgery in a few weeks, and is on the pre-bariatric surg diet.  Past Medical History:  Diagnosis Date  . Coronary artery disease   . Cough syncope   . Diabetes mellitus without complication ([place])   . Fibromyalgia   . HTN (hypertension)   . Hyperlipidemia   . Hypertension   . Morbid obesity ([place])   . SOB (shortness of breath)   . Uncontrolled type 2 diabetes mellitus with complication ([place])   . Vitamin B12 deficiency     Past Surgical History:  Procedure Laterality Date  . ELBOW SURGERY Right   . HERNIA REPAIR    . SHOULDER ARTHROSCOPY Right     Social History   Social History  . Marital status: Married    Spouse name: N/A  . Number of children: N/A  . Years of education: N/A   Occupational History  . Not on file.   Social History Main Topics  . Smoking status: Former Research scientist (life sciences)  . Smokeless tobacco: Current User    Types: Chew     Comment: 30 years ago  . Alcohol use No  . Drug use: No  . Sexual activity: Not Currently   Other Topics Concern  . Not on file   Social History Narrative  . No narrative on file    Current Outpatient Prescriptions on File Prior to Visit  Medication Sig Dispense Refill  . ARIPiprazole (ABILIFY) 5 MG tablet Take 5 mg by mouth daily.     . Blood Glucose Monitoring  Suppl (ONETOUCH VERIO IQ SYSTEM) W/DEVICE KIT Use to check blood sugar 3 times per day. 1 kit 0  . diphenhydrAMINE (SOMINEX) 25 MG tablet Take 1 tablet by mouth as directed.    . DULoxetine (CYMBALTA) 60 MG capsule Take 60 mg by mouth 2 (two) times daily.     . fenofibrate 160 MG tablet Take 160 mg by mouth daily.     Marland Kitchen gabapentin (NEURONTIN) 400 MG capsule Take 800 mg by mouth 4 (four) times daily.     . hydrochlorothiazide (HYDRODIURIL) 12.5 MG tablet Take 1 tablet by mouth daily.    . insulin lispro (HUMALOG KWIKPEN) 100 UNIT/ML KiwkPen Inject 0.3 mLs (30 Units total) into the skin daily with supper. And pen needles 6/day 15 mL 11  . Insulin Pen Needle 31G X 8 MM MISC Use to inject 2 times per day 100 each 2  . metFORMIN (GLUCOPHAGE-XR) 500 MG 24 hr tablet take 4 tablets by mouth once daily WITH SUPPER 120 tablet 3  . methocarbamol (ROBAXIN) 500 MG tablet Take 500-1,000 mg by mouth See admin instructions. Take 1 tablet (500 mg) by mouth every morning, take 1 tablet 8 hours later and then 2 tablets 8 hours after 2nd dose    .  metoprolol (LOPRESSOR) 100 MG tablet Take 100 mg by mouth daily.    Glory Rosebush VERIO test strip use TO CHECK BLOOD SUGAR three times a day 100 each 4  . pantoprazole (PROTONIX) 40 MG tablet Take 40 mg by mouth 2 (two) times daily.     . pregabalin (LYRICA) 150 MG capsule Take 150 mg by mouth 3 (three) times daily.    . quinapril (ACCUPRIL) 40 MG tablet Take 1 tablet by mouth at bedtime.    Marland Kitchen rOPINIRole (REQUIP) 1 MG tablet Take 1 mg by mouth at bedtime.      No current facility-administered medications on file prior to visit.     Allergies  Allergen Reactions  . Penicillins Other (See Comments)    Unknown childhood allergic reaction - no other information available    Family History  Problem Relation Age of Onset  . Diabetes Father     BP 132/86   Pulse 92   Ht 5' 9"  (1.753 m)   Wt 293 lb (132.9 kg)   SpO2 92%   BMI 43.27 kg/m    Review of Systems He  denies hypoglycemia.  He has lost 5 lbs.      Objective:   Physical Exam VITAL SIGNS:  See vs page GENERAL: no distress Pulses: dorsalis pedis intact bilat.  MSK: no deformity of the feet CV: 1+ bilat leg edema Skin: no ulcer on the feet. normal color and temp on the feet, but there is spotty hyperpigmentation of the legs.   Neuro: sensation is intact to touch on the feet, but decreased from normal.   A1c=7.1%     Assessment & Plan:  Insulin-requiring type 2 DM, with CAD: this is the best control this pt should aim for, given this regimen, which does match insulin to his changing needs throughout the day CAD: he should avoid hypoglycemia. Obesity: persistent: I did bariatric surg letter.   Patient is advised the following: Patient Instructions  check your blood sugar twice a day.  vary the time of day when you check, between before the 3 meals, and at bedtime.  also check if you have symptoms of your blood sugar being too high or too low.  please keep a record of the readings and bring it to your next appointment here.  You can write it on any piece of paper.  please call us sooner if your blood sugar goes below 70, or if you have a lot of readings over 200.  Please come back for a follow-up appointment in 3 months (or sooner if you have the surgery).    Please continue the same metformin and victoza. Please reduce the toujeo to 180 units each morning, and:  continue the humalog, 20 units with supper.

## 2016-07-28 NOTE — Patient Instructions (Addendum)
check your blood sugar twice a day.  vary the time of day when you check, between before the 3 meals, and at bedtime.  also check if you have symptoms of your blood sugar being too high or too low.  please keep a record of the readings and bring it to your next appointment here.  You can write it on any piece of paper.  please call us sooner if your blood sugar goes below 70, or if you have a lot of readings over 200.  Please come back for a follow-up appointment in 3 months (or sooner if you have the surgery).    Please continue the same metformin and victoza. Please reduce the toujeo to 180 units each morning, and:  continue the humalog, 20 units with supper.

## 2016-08-03 ENCOUNTER — Other Ambulatory Visit: Payer: Self-pay

## 2016-08-03 MED ORDER — GLUCOSE BLOOD VI STRP: ORAL_STRIP | 4 refills | Status: DC

## 2016-08-05 ENCOUNTER — Telehealth: Payer: Self-pay | Admitting: Endocrinology

## 2016-08-05 NOTE — Telephone Encounter (Signed)
Pt called in [facility] a [facility] to discuss some issues he has been having with his blood sugar. [phone]

## 2016-08-05 NOTE — Telephone Encounter (Signed)
I contacted the patient and advised of message via voicemail. Requested a call back if the patient would like to discuss further.  

## 2016-08-05 NOTE — Telephone Encounter (Signed)
I contacted the patient and he wanted to report his blood sugar readings: 08/04/2016: Fasting: 68 10 am: 119 10 pm: 104  08/05/2016: Fasting: 58 1030 am: 98   Patient stated the bariatric surgeon has placed him on a special diet and he believes that is why his blood sugar has started to drop. Patient confirmed his currently taking 180 units of toujeo and 20 units with humalog with supper.   Please advise, Thanks!

## 2016-08-05 NOTE — Telephone Encounter (Signed)
Good progress. Please reduce toujeo to 120 units qam, and d/c humalog. Call if stays low--we are happy to adjust as needed

## 2016-08-12 DIAGNOSIS — Z87891 Personal history of nicotine dependence: Secondary | ICD-10-CM | POA: Insufficient documentation

## 2016-09-10 DIAGNOSIS — Z903 Acquired absence of stomach [part of]: Secondary | ICD-10-CM | POA: Insufficient documentation

## 2016-09-10 DIAGNOSIS — Z9884 Bariatric surgery status: Secondary | ICD-10-CM | POA: Insufficient documentation

## 2016-09-10 DIAGNOSIS — R600 Localized edema: Secondary | ICD-10-CM | POA: Insufficient documentation

## 2016-09-16 ENCOUNTER — Encounter: Payer: Self-pay | Admitting: Gastroenterology

## 2016-10-26 ENCOUNTER — Ambulatory Visit (INDEPENDENT_AMBULATORY_CARE_PROVIDER_SITE_OTHER): Payer: Commercial Managed Care - PPO | Admitting: Endocrinology

## 2016-10-26 ENCOUNTER — Encounter: Payer: Self-pay | Admitting: Endocrinology

## 2016-10-26 VITALS — BP 128/82 | HR 78 | Ht 69.0 in | Wt 255.0 lb

## 2016-10-26 DIAGNOSIS — Z794 Long term (current) use of insulin: Secondary | ICD-10-CM

## 2016-10-26 DIAGNOSIS — E119 Type 2 diabetes mellitus without complications: Secondary | ICD-10-CM

## 2016-10-26 LAB — POCT GLYCOSYLATED HEMOGLOBIN (HGB A1C): HEMOGLOBIN A1C: 5.7

## 2016-10-26 MED ORDER — URSODIOL 300 MG PO CAPS: 300.0000 mg | ORAL_CAPSULE | Freq: Two times a day (BID) | ORAL | 0 refills | Status: DC

## 2016-10-26 MED ORDER — GLUCOSE BLOOD VI STRP: 1.0000 | ORAL_STRIP | Freq: Every day | 3 refills | Status: DC

## 2016-10-26 NOTE — Patient Instructions (Addendum)
check your blood sugar once a day.  vary the time of day when you check, between before the 3 meals, and at bedtime.  also check if you have symptoms of your blood sugar being too high or too low.  please keep a record of the readings and bring it to your next appointment here.  You can write it on any piece of paper.  please call us sooner if your blood sugar goes below 70, or if you have a lot of readings over 200.  You can stay off the diabetes meds.     Please come back for a follow-up appointment in 4 months.

## 2016-10-26 NOTE — Progress Notes (Signed)
Subjective:    Patient ID: Grant Williams, male    DOB: 1956/06/08, 61 y.o.   MRN: [id number]  HPI Pt returns for f/u of diabetes mellitus:  DM type: Insulin-requiring type 2. Dx'ed: 2008.   Complications: polyneuropathy, nephropathy, and CAD.   Therapy: insulin since 2015 (he also takes victoza and metformin).   DKA: never.  Severe hypoglycemia: never Pancreatitis: never.    Other: He declines multiple daily injections;  Interval history: He recently had gastric sleeve surgery. He has lost 36 lbs so far.  He takes no medication for DM.   Past Medical History:  Diagnosis Date  . Coronary artery disease   . Cough syncope   . Diabetes mellitus without complication (HCC)   . Fibromyalgia   . HTN (hypertension)   . Hyperlipidemia   . Hypertension   . Morbid obesity (HCC)   . SOB (shortness of breath)   . Uncontrolled type 2 diabetes mellitus with complication (HCC)   . Vitamin B12 deficiency     Past Surgical History:  Procedure Laterality Date  . ELBOW SURGERY Right   . HERNIA REPAIR    . SHOULDER ARTHROSCOPY Right     Social History   Social History  . Marital status: Married    Spouse name: N/A  . Number of children: N/A  . Years of education: N/A   Occupational History  . Not on file.   Social History Main Topics  . Smoking status: Former Games developer  . Smokeless tobacco: Current User    Types: Chew     Comment: 30 years ago  . Alcohol use No  . Drug use: No  . Sexual activity: Not Currently   Other Topics Concern  . Not on file   Social History Narrative  . No narrative on file    Current Outpatient Prescriptions on File Prior to Visit  Medication Sig Dispense Refill  . ARIPiprazole (ABILIFY) 5 MG tablet Take 5 mg by mouth daily.     . DULoxetine (CYMBALTA) 60 MG capsule Take 60 mg by mouth 2 (two) times daily.     . fenofibrate 160 MG tablet Take 160 mg by mouth daily.     Marland Kitchen gabapentin (NEURONTIN) 400 MG capsule Take 800 mg by mouth 4 (four)  times daily.     . methocarbamol (ROBAXIN) 500 MG tablet Take 500-1,000 mg by mouth See admin instructions. Take 1 tablet (500 mg) by mouth every morning, take 1 tablet 8 hours later and then 2 tablets 8 hours after 2nd dose    . metoprolol (LOPRESSOR) 100 MG tablet Take 100 mg by mouth daily.    . pantoprazole (PROTONIX) 40 MG tablet Take 40 mg by mouth 2 (two) times daily.     . pregabalin (LYRICA) 150 MG capsule Take 150 mg by mouth 3 (three) times daily.    Marland Kitchen rOPINIRole (REQUIP) 1 MG tablet Take 1 mg by mouth at bedtime.      No current facility-administered medications on file prior to visit.     Allergies  Allergen Reactions  . Penicillins Other (See Comments)    Unknown childhood allergic reaction - no other information available    Family History  Problem Relation Age of Onset  . Diabetes Father     BP 128/82   Pulse 78   Ht 5\' 9"  (1.753 m)   Wt 255 lb (115.7 kg)   BMI 37.66 kg/m   Review of Systems Denies n/v    Objective:  Physical Exam VITAL SIGNS:  See vs page GENERAL: no distress Pulses: dorsalis pedis intact bilat.  MSK: no deformity of the feet CV: 1+ bilat leg edema Skin: no ulcer on the feet. normal color and temp on the feet, but there is spotty hyperpigmentation of the legs.   Neuro: sensation is intact to touch on the feet, but decreased from normal.    a1c=5.7%    Assessment & Plan:  Type 2 DM: well-controlled off rx Obesity: better after surgery.  Patient Instructions  check your blood sugar once a day.  vary the time of day when you check, between before the 3 meals, and at bedtime.  also check if you have symptoms of your blood sugar being too high or too low.  please keep a record of the readings and bring it to your next appointment here.  You can write it on any piece of paper.  please call us sooner if your blood sugar goes below 70, or if you have a lot of readings over 200.  You can stay off the diabetes meds.     Please come back  for a follow-up appointment in 4 months.

## 2017-03-01 ENCOUNTER — Ambulatory Visit: Payer: Commercial Managed Care - PPO | Admitting: Endocrinology

## 2017-03-25 ENCOUNTER — Ambulatory Visit: Payer: Commercial Managed Care - PPO | Admitting: Endocrinology

## 2017-04-12 ENCOUNTER — Encounter: Payer: Self-pay | Admitting: Endocrinology

## 2017-04-12 ENCOUNTER — Ambulatory Visit (INDEPENDENT_AMBULATORY_CARE_PROVIDER_SITE_OTHER): Payer: Commercial Managed Care - PPO | Admitting: Endocrinology

## 2017-04-12 DIAGNOSIS — Z794 Long term (current) use of insulin: Secondary | ICD-10-CM | POA: Diagnosis not present

## 2017-04-12 DIAGNOSIS — E119 Type 2 diabetes mellitus without complications: Secondary | ICD-10-CM

## 2017-04-12 LAB — POCT GLYCOSYLATED HEMOGLOBIN (HGB A1C): HEMOGLOBIN A1C: 9.5

## 2017-04-12 MED ORDER — METFORMIN HCL 500 MG PO TABS: 1000.0000 mg | ORAL_TABLET | Freq: Two times a day (BID) | ORAL | 3 refills | Status: DC

## 2017-04-12 NOTE — Progress Notes (Signed)
Subjective:    Patient ID: Nonah MattesGeorge G KaseyJr, male    DOB: 25-Sep-1955, 61 y.o.   MRN: [id number]  HPI Pt returns for f/u of diabetes mellitus:  DM type: Insulin-requiring type 2. Dx'ed: 2008.   Complications: polyneuropathy, nephropathy, and CAD.   Therapy: insulin since 2015 (he also takes victoza and metformin).   DKA: never.  Severe hypoglycemia: never Pancreatitis: never.    Other: He declines multiple daily injections;  Interval history: He recently had gastric sleeve surgery. He has lost 46 lbs so far.  He takes no medication for DM.  He says cbg's are in the 200's.   Past Medical History:  Diagnosis Date  . Coronary artery disease   . Cough syncope   . Diabetes mellitus without complication (HCC)   . Fibromyalgia   . HTN (hypertension)   . Hyperlipidemia   . Hypertension   . Morbid obesity (HCC)   . SOB (shortness of breath)   . Uncontrolled type 2 diabetes mellitus with complication (HCC)   . Vitamin B12 deficiency     Past Surgical History:  Procedure Laterality Date  . ELBOW SURGERY Right   . HERNIA REPAIR    . SHOULDER ARTHROSCOPY Right     Social History   Social History  . Marital status: Married    Spouse name: N/A  . Number of children: N/A  . Years of education: N/A   Occupational History  . Not on file.   Social History Main Topics  . Smoking status: Former Games developermoker  . Smokeless tobacco: Current User    Types: Chew     Comment: 30 years ago  . Alcohol use No  . Drug use: No  . Sexual activity: Not Currently   Other Topics Concern  . Not on file   Social History Narrative  . No narrative on file    Current Outpatient Prescriptions on File Prior to Visit  Medication Sig Dispense Refill  . ARIPiprazole (ABILIFY) 5 MG tablet Take 5 mg by mouth daily.     . DULoxetine (CYMBALTA) 60 MG capsule Take 60 mg by mouth 2 (two) times daily.     Marland Kitchen. gabapentin (NEURONTIN) 400 MG capsule Take 800 mg by mouth 4 (four) times daily.     Marland Kitchen. glucose  blood (ONETOUCH VERIO) test strip 1 each by Other route daily. use TO CHECK BLOOD SUGAR three times a day 100 each 3  . methocarbamol (ROBAXIN) 500 MG tablet Take 500-1,000 mg by mouth See admin instructions. Take 1 tablet (500 mg) by mouth every morning, take 1 tablet 8 hours later and then 2 tablets 8 hours after 2nd dose    . metoprolol (LOPRESSOR) 100 MG tablet Take 100 mg by mouth daily.    . pantoprazole (PROTONIX) 40 MG tablet Take 40 mg by mouth 2 (two) times daily.     . pregabalin (LYRICA) 150 MG capsule Take 150 mg by mouth 3 (three) times daily.    Marland Kitchen. rOPINIRole (REQUIP) 1 MG tablet Take 1 mg by mouth at bedtime.     . ursodiol (ACTIGALL) 300 MG capsule Take 1 capsule (300 mg total) by mouth 2 (two) times daily. 60 capsule 0   No current facility-administered medications on file prior to visit.     Allergies  Allergen Reactions  . Penicillins Other (See Comments)    Unknown childhood allergic reaction - no other information available    Family History  Problem Relation Age of Onset  . Diabetes Father  BP (!) 148/84 (BP Location: Left Arm, Patient Position: Sitting, Cuff Size: Normal)   Pulse 63   Temp (!) 97.5 F (36.4 C) (Oral)   Wt 245 lb 2 oz (111.2 kg)   SpO2 94%   BMI 36.20 kg/m    Review of Systems Denies n/v/d    Objective:   Physical Exam VITAL SIGNS:  See vs page GENERAL: no distress Pulses: dorsalis pedis intact bilat.  MSK: no deformity of the feet CV: 1+ bilat leg edema Skin: no ulcer on the feet. normal color and temp on the feet, but there is spotty hyperpigmentation of the legs.   Neuro: sensation is intact to touch on the feet, but decreased from normal.   outside test results are reviewed: Creat=0.72 K+=4.6%    Assessment & Plan:  Type 2 DM, with CAD: worse. Edema: this limits rx options. Obesity: improved since surgery  Patient Instructions  check your blood sugar once a day.  vary the time of day when you check, between  before the 3 meals, and at bedtime.  also check if you have symptoms of your blood sugar being too high or too low.  please keep a record of the readings and bring it to your next appointment here.  You can write it on any piece of paper.  please call us sooner if your blood sugar goes below 70, or if you have a lot of readings over 200.  I have sent a prescription to your pharmacy, to resume the metformin. Please call or message [place] next week, to tell us how the blood sugar is doing.  We'll probably need to add "[place]," or "jardiance."  Please come back for a follow-up appointment in 3 months.

## 2017-04-12 NOTE — Patient Instructions (Addendum)
check your blood sugar once a day.  vary the time of day when you check, between before the 3 meals, and at bedtime.  also check if you have symptoms of your blood sugar being too high or too low.  please keep a record of the readings and bring it to your next appointment here.  You can write it on any piece of paper.  please call us sooner if your blood sugar goes below 70, or if you have a lot of readings over 200.  I have sent a prescription to your pharmacy, to resume the metformin. Please call or message us next week, to tell us how the blood sugar is doing.  We'll probably need to add "[place]," or "jardiance."  Please come back for a follow-up appointment in 3 months.

## 2017-07-13 ENCOUNTER — Ambulatory Visit: Payer: Commercial Managed Care - PPO | Admitting: Endocrinology

## 2017-07-13 DIAGNOSIS — Z0289 Encounter for other administrative examinations: Secondary | ICD-10-CM

## 2017-08-31 DIAGNOSIS — D509 Iron deficiency anemia, unspecified: Secondary | ICD-10-CM | POA: Insufficient documentation

## 2017-09-21 ENCOUNTER — Encounter: Payer: Self-pay | Admitting: Cardiovascular Disease

## 2017-09-21 ENCOUNTER — Ambulatory Visit (INDEPENDENT_AMBULATORY_CARE_PROVIDER_SITE_OTHER): Payer: Commercial Managed Care - PPO | Admitting: Cardiovascular Disease

## 2017-09-21 VITALS — BP 140/86 | HR 54 | Ht 68.5 in | Wt 246.0 lb

## 2017-09-21 DIAGNOSIS — I2510 Atherosclerotic heart disease of native coronary artery without angina pectoris: Secondary | ICD-10-CM

## 2017-09-21 DIAGNOSIS — G4733 Obstructive sleep apnea (adult) (pediatric): Secondary | ICD-10-CM

## 2017-09-21 DIAGNOSIS — I10 Essential (primary) hypertension: Secondary | ICD-10-CM

## 2017-09-21 DIAGNOSIS — E7800 Pure hypercholesterolemia, unspecified: Secondary | ICD-10-CM | POA: Diagnosis not present

## 2017-09-21 NOTE — Assessment & Plan Note (Signed)
History of hyperlipidemia with recent lipid profile performed by his PCP 08/10/17 revealing a total cholesterol of 178, triglyceride level of 320, LDL 79 and HDL of 35.he is admit to dietary indiscretion.

## 2017-09-21 NOTE — Assessment & Plan Note (Signed)
History of CAD status post cardiac catheterization by Dr. Clifton JamesMcAlhany  in 2012 revealing diagonal branch disease treated medically.he denies chest pain or shortness of breath.

## 2017-09-21 NOTE — Assessment & Plan Note (Signed)
History of obstructive sleep apnea on CPAP which apparently does not fit correctly and he has an appointment scheduled in near future for correction.

## 2017-09-21 NOTE — Patient Instructions (Signed)
Medication Instructions: Your physician recommends that you continue on your current medications as directed. Please refer to the Current Medication list given to you today.   Follow-Up: Your physician recommends that you schedule a follow-up appointment in: 1 month with PharmD for BP Check. Your physician has requested that you regularly monitor and record your blood pressure readings at home. Please use the same machine at the same time of day to check your readings and record them to bring to your follow-up visit.  We request that you follow-up in: 6 months with an extender and in 12 months with Dr San MorelleBerry  You will receive a reminder letter in the mail two months in advance. If you don't receive a letter, please call our office to schedule the follow-up appointment.  If you need a refill on your cardiac medications before your next appointment, please call your pharmacy.

## 2017-09-21 NOTE — Progress Notes (Signed)
09/21/2017 Grant Williams   05-24-1956  [id number]  Primary Physician Elizabeth Palau, FNP Primary Cardiologist: Runell Gess MD Nicholes Calamity, [place]  HPI:  Grant Williams is a 62 y.o. severely overweight married Caucasian male father of 2, grandfather 3 grandchildren was accompanied by his wife Grant Williams. He was referred by Grant Williams, nurse practitioner, for cardiovascular evaluation and clearance for his planned Roux-en-Y gastric bypass operation early next year. History of factors include treated hypertension, diabetes and hyperlipidemia. He has never had a heart attack or stroke. He denies chest pain but does get some dyspnea probably related to obesity. 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 Does have sleep apnea on BiPAP. He has seen Dr. Sanjuana Kava in the past and has had cardiac catheterization 2002 revealing moderate diagonal branch disease treated medically. He was hospitalized because of elevated CPK of unknown etiology. A 2-D echo was essentially normal and a Myoview stress test showed inferolateral scar without ischemia thought to be right low risk. He did have a Roux-en-Y gastric bypass procedure performed #53/18 and has lost 50 pounds. He feels clinically improved He denies chest pain or shortness of breath. Does have diabetic peripheral neuropathy which limits his ability to exercise. Recent lipid profile performed by his PCP on 08/10/17 revealed a total cholesterol of 178, LDL 79, HDL 35 and triglyceride level of 320. He does admit to dietary indiscretion.    Current Meds  Medication Sig  . acetaminophen (TYLENOL) 500 MG tablet Take 1 tablet by mouth every 6 (six) hours as needed.  Marland Kitchen amLODipine (NORVASC) 5 MG tablet Take 1 tablet by mouth daily.  . ARIPiprazole (ABILIFY) 5 MG tablet Take 5 mg by mouth daily.   . Calcium Citrate-Vitamin D 200-250 MG-UNIT TABS Take 1 tablet by mouth 2 (two) times daily.  Marland Kitchen docusate sodium (COLACE) 100 MG capsule Take  100 mg by mouth 2 (two) times daily.  . DULoxetine (CYMBALTA) 60 MG capsule Take 60 mg by mouth 2 (two) times daily.   . ergocalciferol (VITAMIN D2) 50000 units capsule Take 50,000 Units by mouth once a week.  . gabapentin (NEURONTIN) 400 MG capsule Take 800 mg by mouth 4 (four) times daily.   Marland Kitchen glucose blood (ONETOUCH VERIO) test strip 1 each by Other route daily. use TO CHECK BLOOD SUGAR three times a day  . iron polysaccharides (NIFEREX) 150 MG capsule Take 1 capsule by mouth 2 (two) times daily.  . metFORMIN (GLUCOPHAGE) 500 MG tablet Take 2 tablets (1,000 mg total) by mouth 2 (two) times daily with a meal.  . methocarbamol (ROBAXIN) 500 MG tablet Take 500-1,000 mg by mouth See admin instructions. Take 1 tablet (500 mg) by mouth every morning, take 1 tablet 8 hours later and then 2 tablets 8 hours after 2nd dose  . metoprolol (LOPRESSOR) 100 MG tablet Take 100 mg by mouth daily.  . Multiple Vitamin (MULTIVITAMIN) tablet Take 1 tablet by mouth daily.  . pantoprazole (PROTONIX) 40 MG tablet Take 40 mg by mouth 2 (two) times daily.   . pregabalin (LYRICA) 150 MG capsule Take 150 mg by mouth 3 (three) times daily.  Marland Kitchen rOPINIRole (REQUIP) 1 MG tablet Take 1 mg by mouth at bedtime.   . traZODone (DESYREL) 50 MG tablet Take 50 mg by mouth at bedtime as needed for sleep.  . valsartan-hydrochlorothiazide (DIOVAN-HCT) 320-12.5 MG tablet Take 1 tablet by mouth daily.     Allergies  Allergen Reactions  . Penicillins  Other (See Comments)    Unknown childhood allergic reaction - no other information available    Social History   Socioeconomic History  . Marital status: Married    Spouse name: Not on file  . Number of children: Not on file  . Years of education: Not on file  . Highest education level: Not on file  Occupational History  . Not on file  Social Needs  . Financial resource strain: Not on file  . Food insecurity:    Worry: Not on file    Inability: Not on file  . Transportation  needs:    Medical: Not on file    Non-medical: Not on file  Tobacco Use  . Smoking status: Former Games developermoker  . Smokeless tobacco: Current User    Types: Chew  . Tobacco comment: 30 years ago  Substance and Sexual Activity  . Alcohol use: No  . Drug use: No  . Sexual activity: Not Currently  Lifestyle  . Physical activity:    Days per week: Not on file    Minutes per session: Not on file  . Stress: Not on file  Relationships  . Social connections:    Talks on phone: Not on file    Gets together: Not on file    Attends religious service: Not on file    Active member of club or organization: Not on file    Attends meetings of clubs or organizations: Not on file    Relationship status: Not on file  . Intimate partner violence:    Fear of current or ex partner: Not on file    Emotionally abused: Not on file    Physically abused: Not on file    Forced sexual activity: Not on file  Other Topics Concern  . Not on file  Social History Narrative  . Not on file     Review of Systems: General: negative for chills, fever, night sweats or weight changes.  Cardiovascular: negative for chest pain, dyspnea on exertion, edema, orthopnea, palpitations, paroxysmal nocturnal dyspnea or shortness of breath Dermatological: negative for rash Respiratory: negative for cough or wheezing Urologic: negative for hematuria Abdominal: negative for nausea, vomiting, diarrhea, bright red blood per rectum, melena, or hematemesis Neurologic: negative for visual changes, syncope, or dizziness All other systems reviewed and are otherwise negative except as noted above.    Blood pressure 140/86, pulse (!) 54, height 5' 8.5" (1.74 m), weight 246 lb (111.6 kg).  General appearance: alert and no distress Neck: no adenopathy, no carotid bruit, no JVD, supple, symmetrical, trachea midline and thyroid not enlarged, symmetric, no tenderness/mass/nodules Lungs: clear to auscultation bilaterally Heart: regular  rate and rhythm, S1, S2 normal, no murmur, click, rub or gallop Extremities: extremities normal, atraumatic, no cyanosis or edema Pulses: 2+ and symmetric Skin: Skin color, texture, turgor normal. No rashes or lesions Neurologic: Alert and oriented X 3, normal strength and tone. Normal symmetric reflexes. Normal coordination and gait  EKG sinus bradycardia 54 without ST or T-wave changes. I personally reviewed this EKG.  ASSESSMENT AND PLAN:   HLD (hyperlipidemia) History of hyperlipidemia with recent lipid profile performed by his PCP 08/10/17 revealing a total cholesterol of 178, triglyceride level of 320, LDL 79 and HDL of 35.he is admit to dietary indiscretion.   HTN (hypertension) History of essential hypertension blood pressure measured at 140/86. He is on Diovan/hydrochlorothiazide, metoprolol, and amlodipine. Continue current meds at current dosing.he will keep a daily blood pressure log over the next 30  days and see Belenda Cruise back in the office for review and medicine titration. We talked about the importance of dietary modification with regards to salt.  CAD, NATIVE VESSEL (40-50% first diag LAD in 2012) History of CAD status post cardiac catheterization by Dr. Clifton James  in 2012 revealing diagonal branch disease treated medically.he denies chest pain or shortness of breath.  OSA (obstructive sleep apnea) noncompliant w/ CPAP History of obstructive sleep apnea on CPAP which apparently does not fit correctly and he has an appointment scheduled in near future for correction.      Runell Gess MD FACP,FACC,FAHA, [facility] 09/21/2017 9:43 AM

## 2017-09-21 NOTE — Assessment & Plan Note (Signed)
History of essential hypertension blood pressure measured at 140/86. He is on Diovan/hydrochlorothiazide, metoprolol, and amlodipine. Continue current meds at current dosing.he will keep a daily blood pressure log over the next 30 days and see Belenda CruiseKristin back in the office for review and medicine titration. We talked about the importance of dietary modification with regards to salt.

## 2017-10-21 ENCOUNTER — Ambulatory Visit (INDEPENDENT_AMBULATORY_CARE_PROVIDER_SITE_OTHER): Payer: Commercial Managed Care - PPO | Admitting: Pharmacist

## 2017-10-21 VITALS — BP 140/82 | HR 70

## 2017-10-21 DIAGNOSIS — I10 Essential (primary) hypertension: Secondary | ICD-10-CM

## 2017-10-21 NOTE — Progress Notes (Signed)
Patient ID: Grant Williams                 DOB: [birth year]                      MRN: [id number]     HPI: Grant Williams is a 62 y.o. male referred by Dr. Allyson Sabal to HTN clinic. PMH includes hypertension, hyperlipidemia, CAD, and diabetes.  Patient had bariatric surgery in March/2018 and lost 48 pounds so far. Presents today for HTN clinic evaluation and medication titration. Denies swelling, headaches, dizziness, lightheadedness or chest pain.   Current HTN meds:  Amlodipine  daily Metoprolol succinate  daily Diovan-HCT 320-12.5mg  daily   BP goal: <130/80  Family History: HTN from father,   Social History: denies caffeine, no alcohol , denies tobacco  Diet: eats every 8 hours, no fried food, low fat , low carbs, increased protein, likes salty foods  Exercise: exercise ~ 3x/week, mainly resistance, some walking   Home BP readings:  14 readings; average 160/96   Wt Readings from Last 3 Encounters:  09/21/17 246 lb (111.6 kg)  04/12/17 245 lb 2 oz (111.2 kg)  10/26/16 255 lb (115.7 kg)   BP Readings from Last 3 Encounters:  10/21/17 140/82  09/21/17 140/86  04/12/17 (!) 148/84   Pulse Readings from Last 3 Encounters:  10/21/17 70  09/21/17 (!) 54  04/12/17 63     Past Medical History:  Diagnosis Date  . Coronary artery disease   . Cough syncope   . Diabetes mellitus without complication (HCC)   . Fibromyalgia   . HTN (hypertension)   . Hyperlipidemia   . Hypertension   . Morbid obesity (HCC)   . SOB (shortness of breath)   . Uncontrolled type 2 diabetes mellitus with complication (HCC)   . Vitamin B12 deficiency     Current Outpatient Medications on File Prior to Visit  Medication Sig Dispense Refill  . acetaminophen (TYLENOL) 500 MG tablet Take 1 tablet by mouth every 6 (six) hours as needed.    Marland Kitchen amLODipine (NORVASC) 5 MG tablet Take 2 tablets by mouth daily.    . ARIPiprazole (ABILIFY) 5 MG tablet Take 5 mg by mouth daily.     . Calcium  Citrate-Vitamin D 200-250 MG-UNIT TABS Take 1 tablet by mouth 2 (two) times daily.    Marland Kitchen docusate sodium (COLACE) 100 MG capsule Take 100 mg by mouth 2 (two) times daily.    . DULoxetine (CYMBALTA) 60 MG capsule Take 60 mg by mouth 2 (two) times daily.     . ergocalciferol (VITAMIN D2) 50000 units capsule Take 50,000 Units by mouth 2 (two) times a week.     . gabapentin (NEURONTIN) 400 MG capsule Take 800 mg by mouth 4 (four) times daily.     Marland Kitchen glucose blood (ONETOUCH VERIO) test strip 1 each by Other route daily. use TO CHECK BLOOD SUGAR three times a day 100 each 3  . iron polysaccharides (NIFEREX) 150 MG capsule Take 1 capsule by mouth 2 (two) times daily.    . metFORMIN (GLUCOPHAGE) 500 MG tablet Take 2 tablets (1,000 mg total) by mouth 2 (two) times daily with a meal. 360 tablet 3  . methocarbamol (ROBAXIN) 500 MG tablet Take 500-1,000 mg by mouth See admin instructions. Take 1 tablet (500 mg) by mouth every morning, take 1 tablet 8 hours later and then 2 tablets 8 hours after 2nd dose    . metoprolol (LOPRESSOR) 100  MG tablet Take 100 mg by mouth daily.    . Multiple Vitamin (MULTIVITAMIN) tablet Take 1 tablet by mouth daily.    . pantoprazole (PROTONIX) 40 MG tablet Take 40 mg by mouth daily.     . pregabalin (LYRICA) 150 MG capsule Take 150 mg by mouth 3 (three) times daily.    Marland Kitchen rOPINIRole (REQUIP) 1 MG tablet Take 1 mg by mouth at bedtime.     . traZODone (DESYREL) 50 MG tablet Take 50 mg by mouth at bedtime as needed for sleep.    . valsartan-hydrochlorothiazide (DIOVAN-HCT) 320-12.5 MG tablet Take 1 tablet by mouth daily.     No current facility-administered medications on file prior to visit.     Allergies  Allergen Reactions  . Penicillins Other (See Comments)    Unknown childhood allergic reaction - no other information available    Blood pressure 140/82, pulse 70, SpO2 95 %.  HTN (hypertension) Blood pressure remains above goal and patient denies problems with current  therapy. He is working on appropriates lifestyle modifications. Will increase amlodipine to 10 mg daily and follow up in 4 weeks. Plan to add spironolactone to therapy if additional blood pressure control needed during next visist.    Medha Pippen Rodriguez-Guzman PharmD, BCPS, CPP [facility] [address] [place] [address] 10/25/2017 12:17 PM

## 2017-10-21 NOTE — Patient Instructions (Addendum)
Return for a follow up appointment in 4 weeks  Check your blood pressure at home daily (if able) and keep record of the readings.  Take your BP meds as follows:  *INCREASE amlodipine dose to  daily (2 tablets of )* *Continue all other medication as previously prescribed*  Bring all of your meds, your BP cuff and your record of home blood pressures to your next appointment.  Exercise as you're able, try to walk approximately 30 minutes per day.  Keep salt intake to a minimum, especially watch canned and prepared boxed foods.  Eat more fresh fruits and vegetables and fewer canned items.  Avoid eating in fast food restaurants.    HOW TO TAKE YOUR BLOOD PRESSURE: . Rest 5 minutes before taking your blood pressure. .  Don't smoke or drink caffeinated beverages for at least 30 minutes before. . Take your blood pressure before (not after) you eat. . Sit comfortably with your back supported and both feet on the floor (don't cross your legs). . Elevate your arm to heart level on a table or a desk. . Use the proper sized cuff. It should fit smoothly and snugly around your bare upper arm. There should be enough room to slip a fingertip under the cuff. The bottom edge of the cuff should be 1 inch above the crease of the elbow. . Ideally, take 3 measurements at one sitting and record the average.

## 2017-10-25 ENCOUNTER — Encounter: Payer: Self-pay | Admitting: Pharmacist

## 2017-10-25 NOTE — Assessment & Plan Note (Signed)
Blood pressure remains above goal and patient denies problems with current therapy. He is working on appropriates lifestyle modifications. Will increase amlodipine to 10 mg daily and follow up in 4 weeks. Plan to add spironolactone to therapy if additional blood pressure control needed during next visist.

## 2017-10-26 ENCOUNTER — Encounter: Payer: Self-pay | Admitting: Endocrinology

## 2017-10-26 ENCOUNTER — Ambulatory Visit (INDEPENDENT_AMBULATORY_CARE_PROVIDER_SITE_OTHER): Payer: Commercial Managed Care - PPO | Admitting: Endocrinology

## 2017-10-26 VITALS — BP 138/68 | HR 61 | Wt 240.2 lb

## 2017-10-26 DIAGNOSIS — E119 Type 2 diabetes mellitus without complications: Secondary | ICD-10-CM

## 2017-10-26 DIAGNOSIS — Z794 Long term (current) use of insulin: Secondary | ICD-10-CM | POA: Diagnosis not present

## 2017-10-26 DIAGNOSIS — I2510 Atherosclerotic heart disease of native coronary artery without angina pectoris: Secondary | ICD-10-CM

## 2017-10-26 LAB — POCT GLYCOSYLATED HEMOGLOBIN (HGB A1C): Hemoglobin A1C: 7.3

## 2017-10-26 MED ORDER — DULAGLUTIDE 0.75 MG/0.5ML ~~LOC~~ SOAJ: 0.7500 mg | SUBCUTANEOUS | 3 refills | Status: DC

## 2017-10-26 NOTE — Patient Instructions (Addendum)
check your blood sugar once a day.  vary the time of day when you check, between before the 3 meals, and at bedtime.  also check if you have symptoms of your blood sugar being too high or too low.  please keep a record of the readings and bring it to your next appointment here.  You can write it on any piece of paper.  please call us sooner if your blood sugar goes below 70, or if you have a lot of readings over 200.  I have sent a prescription to your pharmacy, to add "trulicity."   Please come back for a follow-up appointment in 3-4 months.

## 2017-10-26 NOTE — Progress Notes (Signed)
Subjective:    Patient ID: Grant Williams, male    DOB: 02/06/1956, 62 y.o.   MRN: [id number]  HPI Pt returns for f/u of diabetes mellitus:  DM type: 2. Dx'ed: 2008.   Complications: polyneuropathy, nephropathy, and CAD.   Therapy: metformin   DKA: never.  Severe hypoglycemia: never.   Pancreatitis: never.    Other: He took insulin 2015-2018; he had gastric bypass surgery in 2018. Interval history: He has lost 51 lbs so far.  He says cbg's are in the mid-100's. pt states he feels well in general.    Past Medical History:  Diagnosis Date  . Coronary artery disease   . Cough syncope   . Diabetes mellitus without complication (HCC)   . Fibromyalgia   . HTN (hypertension)   . Hyperlipidemia   . Hypertension   . Morbid obesity (HCC)   . SOB (shortness of breath)   . Uncontrolled type 2 diabetes mellitus with complication (HCC)   . Vitamin B12 deficiency     Past Surgical History:  Procedure Laterality Date  . ELBOW SURGERY Right   . HERNIA REPAIR    . SHOULDER ARTHROSCOPY Right     Social History   Socioeconomic History  . Marital status: Married    Spouse name: Not on file  . Number of children: Not on file  . Years of education: Not on file  . Highest education level: Not on file  Occupational History  . Not on file  Social Needs  . Financial resource strain: Not on file  . Food insecurity:    Worry: Not on file    Inability: Not on file  . Transportation needs:    Medical: Not on file    Non-medical: Not on file  Tobacco Use  . Smoking status: Former Games developer  . Smokeless tobacco: Current User    Types: Chew  . Tobacco comment: 30 years ago  Substance and Sexual Activity  . Alcohol use: No  . Drug use: No  . Sexual activity: Not Currently  Lifestyle  . Physical activity:    Days per week: Not on file    Minutes per session: Not on file  . Stress: Not on file  Relationships  . Social connections:    Talks on phone: Not on file    Gets together:  Not on file    Attends religious service: Not on file    Active member of club or organization: Not on file    Attends meetings of clubs or organizations: Not on file    Relationship status: Not on file  . Intimate partner violence:    Fear of current or ex partner: Not on file    Emotionally abused: Not on file    Physically abused: Not on file    Forced sexual activity: Not on file  Other Topics Concern  . Not on file  Social History Narrative  . Not on file    Current Outpatient Medications on File Prior to Visit  Medication Sig Dispense Refill  . acetaminophen (TYLENOL) 500 MG tablet Take 1 tablet by mouth every 6 (six) hours as needed.    Marland Kitchen amLODipine (NORVASC) 5 MG tablet Take 2 tablets by mouth daily.    . ARIPiprazole (ABILIFY) 5 MG tablet Take 5 mg by mouth daily.     . Calcium Citrate-Vitamin D 200-250 MG-UNIT TABS Take 1 tablet by mouth 2 (two) times daily.    Marland Kitchen docusate sodium (COLACE) 100 MG capsule  Take 100 mg by mouth 2 (two) times daily.    . DULoxetine (CYMBALTA) 60 MG capsule Take 60 mg by mouth 2 (two) times daily.     . ergocalciferol (VITAMIN D2) 50000 units capsule Take 50,000 Units by mouth 2 (two) times a week.     . gabapentin (NEURONTIN) 400 MG capsule Take 800 mg by mouth 4 (four) times daily.     Marland Kitchen glucose blood (ONETOUCH VERIO) test strip 1 each by Other route daily. use TO CHECK BLOOD SUGAR three times a day 100 each 3  . iron polysaccharides (NIFEREX) 150 MG capsule Take 1 capsule by mouth 2 (two) times daily.    . metFORMIN (GLUCOPHAGE) 500 MG tablet Take 2 tablets (1,000 mg total) by mouth 2 (two) times daily with a meal. 360 tablet 3  . methocarbamol (ROBAXIN) 500 MG tablet Take 500-1,000 mg by mouth See admin instructions. Take 1 tablet (500 mg) by mouth every morning, take 1 tablet 8 hours later and then 2 tablets 8 hours after 2nd dose    . metoprolol (LOPRESSOR) 100 MG tablet Take 100 mg by mouth daily.    . Multiple Vitamin (MULTIVITAMIN) tablet  Take 1 tablet by mouth daily.    . pantoprazole (PROTONIX) 40 MG tablet Take 40 mg by mouth daily.     . pregabalin (LYRICA) 150 MG capsule Take 150 mg by mouth 3 (three) times daily.    Marland Kitchen rOPINIRole (REQUIP) 1 MG tablet Take 1 mg by mouth at bedtime.     . traZODone (DESYREL) 50 MG tablet Take 50 mg by mouth at bedtime as needed for sleep.    . valsartan-hydrochlorothiazide (DIOVAN-HCT) 320-12.5 MG tablet Take 1 tablet by mouth daily.     No current facility-administered medications on file prior to visit.     Allergies  Allergen Reactions  . Penicillins Other (See Comments)    Unknown childhood allergic reaction - no other information available    Family History  Problem Relation Age of Onset  . Diabetes Father     BP 138/68   Pulse 61   Wt 240 lb 3.2 oz (109 kg)   SpO2 98%   BMI 35.99 kg/m    Review of Systems He denies n/v.      Objective:   Physical Exam VITAL SIGNS:  See vs page GENERAL: no distress Pulses: dorsalis pedis intact bilat.  MSK: no deformity of the feet CV: trace bilat leg edema Skin: no ulcer on the feet. normal color and temp on the feet, but there is spotty hyperpigmentation of the legs.   Neuro: sensation is intact to touch on the feet, but decreased from normal.    Lab Results  Component Value Date   HGBA1C 7.3 10/26/2017      Assessment & Plan:  Type 2 DM: he needs increased rx.  Obesity: improved.  We discussed the fact that trulicity may help.   Patient Instructions  check your blood sugar once a day.  vary the time of day when you check, between before the 3 meals, and at bedtime.  also check if you have symptoms of your blood sugar being too high or too low.  please keep a record of the readings and bring it to your next appointment here.  You can write it on any piece of paper.  please call us sooner if your blood sugar goes below 70, or if you have a lot of readings over 200.  I have sent a prescription  to your pharmacy, to add  "trulicity."   Please come back for a follow-up appointment in 3-4 months.

## 2017-11-08 ENCOUNTER — Telehealth: Payer: Self-pay | Admitting: Pharmacist

## 2017-11-08 MED ORDER — AMLODIPINE BESYLATE 10 MG PO TABS: 10.0000 mg | ORAL_TABLET | Freq: Every day | ORAL | 1 refills | Status: DC

## 2017-11-08 NOTE — Telephone Encounter (Signed)
Patient calling. He is out of amlodipine and wants to know what to do.  Talked to patient today. He is tolerating medication and denies ADRs.  Rx for amlodipine  daily sent to prefer pharmacy

## 2017-11-18 ENCOUNTER — Ambulatory Visit (INDEPENDENT_AMBULATORY_CARE_PROVIDER_SITE_OTHER): Payer: Commercial Managed Care - PPO | Admitting: Pharmacist Clinician (PhC)/ Clinical Pharmacy Specialist

## 2017-11-18 DIAGNOSIS — I2510 Atherosclerotic heart disease of native coronary artery without angina pectoris: Secondary | ICD-10-CM

## 2017-11-18 DIAGNOSIS — I10 Essential (primary) hypertension: Secondary | ICD-10-CM

## 2017-11-18 NOTE — Patient Instructions (Addendum)
Call if you have any questions or concerns [phone] (Raquel or Belenda Cruise) Your blood pressure today is 130/78  Check your blood pressure at home daily and keep record of the readings.  Take your BP meds as follows:  Amlodipine 10 mg daily in the evenings  Metoprolol succinate  in the am, 50 mg in the evening  Diovan-HCT 320-12.5mg  daily in the morning  Bring all of your meds, your BP cuff and your record of home blood pressures to your next appointment.  Exercise as you're able, try to walk approximately 30 minutes per day.  Keep salt intake to a minimum, especially watch canned and prepared boxed foods.  Eat more fresh fruits and vegetables and fewer canned items.  Avoid eating in fast food restaurants.    HOW TO TAKE YOUR BLOOD PRESSURE: . Rest 5 minutes before taking your blood pressure. .  Don't smoke or drink caffeinated beverages for at least 30 minutes before. . Take your blood pressure before (not after) you eat. . Sit comfortably with your back supported and both feet on the floor (don't cross your legs). . Elevate your arm to heart level on a table or a desk. . Use the proper sized cuff. It should fit smoothly and snugly around your bare upper arm. There should be enough room to slip a fingertip under the cuff. The bottom edge of the cuff should be 1 inch above the crease of the elbow. . Ideally, take 3 measurements at one sitting and record the average.

## 2017-11-18 NOTE — Progress Notes (Deleted)
Patient ID: DONAL Williams                 DOB: [birth year]                      MRN: [id number]     HPI: Grant Williams is a 62 y.o. male referred by Dr. Allyson Sabal to HTN clinic. PMH includes hypertension, hyperlipidemia, CAD, and diabetes.  Patient had bariatric surgery in March/2018 and lost 48 pounds so far. Presents today for HTN clinic evaluation and medication titration. Denies swelling, headaches, dizziness, lightheadedness or chest pain.   Current HTN meds:  Amlodipine  daily Metoprolol succinate  daily Diovan-HCT 320-12.5mg  daily   BP goal: <130/80  Family History: HTN from father,   Social History: denies caffeine, no alcohol , denies tobacco  Diet: eats every 8 hours, no fried food, low fat , low carbs, increased protein, likes salty foods  Exercise: exercise ~ 3x/week, mainly resistance, some walking   Home BP readings:    Wt Readings from Last 3 Encounters:  10/26/17 240 lb 3.2 oz (109 kg)  09/21/17 246 lb (111.6 kg)  04/12/17 245 lb 2 oz (111.2 kg)   BP Readings from Last 3 Encounters:  10/26/17 138/68  10/21/17 140/82  09/21/17 140/86   Pulse Readings from Last 3 Encounters:  10/26/17 61  10/21/17 70  09/21/17 (!) 54     Past Medical History:  Diagnosis Date  . Coronary artery disease   . Cough syncope   . Diabetes mellitus without complication (HCC)   . Fibromyalgia   . HTN (hypertension)   . Hyperlipidemia   . Hypertension   . Morbid obesity (HCC)   . SOB (shortness of breath)   . Uncontrolled type 2 diabetes mellitus with complication (HCC)   . Vitamin B12 deficiency     Current Outpatient Medications on File Prior to Visit  Medication Sig Dispense Refill  . acetaminophen (TYLENOL) 500 MG tablet Take 1 tablet by mouth every 6 (six) hours as needed.    Marland Kitchen amLODipine (NORVASC) 10 MG tablet Take 1 tablet (10 mg total) by mouth daily. 90 tablet 1  . ARIPiprazole (ABILIFY) 5 MG tablet Take 5 mg by mouth daily.     . Calcium  Citrate-Vitamin D 200-250 MG-UNIT TABS Take 1 tablet by mouth 2 (two) times daily.    Marland Kitchen docusate sodium (COLACE) 100 MG capsule Take 100 mg by mouth 2 (two) times daily.    . Dulaglutide (TRULICITY) 0.75 MG/0.5ML SOPN Inject 0.75 mg into the skin once a week. 12 pen 3  . DULoxetine (CYMBALTA) 60 MG capsule Take 60 mg by mouth 2 (two) times daily.     . ergocalciferol (VITAMIN D2) 50000 units capsule Take 50,000 Units by mouth 2 (two) times a week.     . gabapentin (NEURONTIN) 400 MG capsule Take 800 mg by mouth 4 (four) times daily.     Marland Kitchen glucose blood (ONETOUCH VERIO) test strip 1 each by Other route daily. use TO CHECK BLOOD SUGAR three times a day 100 each 3  . iron polysaccharides (NIFEREX) 150 MG capsule Take 1 capsule by mouth 2 (two) times daily.    . metFORMIN (GLUCOPHAGE) 500 MG tablet Take 2 tablets (1,000 mg total) by mouth 2 (two) times daily with a meal. 360 tablet 3  . methocarbamol (ROBAXIN) 500 MG tablet Take 500-1,000 mg by mouth See admin instructions. Take 1 tablet (500 mg) by mouth every morning, take 1  tablet 8 hours later and then 2 tablets 8 hours after 2nd dose    . metoprolol (LOPRESSOR) 100 MG tablet Take 100 mg by mouth daily.    . Multiple Vitamin (MULTIVITAMIN) tablet Take 1 tablet by mouth daily.    . pantoprazole (PROTONIX) 40 MG tablet Take 40 mg by mouth daily.     . pregabalin (LYRICA) 150 MG capsule Take 150 mg by mouth 3 (three) times daily.    Marland Kitchen rOPINIRole (REQUIP) 1 MG tablet Take 1 mg by mouth at bedtime.     . traZODone (DESYREL) 50 MG tablet Take 50 mg by mouth at bedtime as needed for sleep.    . valsartan-hydrochlorothiazide (DIOVAN-HCT) 320-12.5 MG tablet Take 1 tablet by mouth daily.     No current facility-administered medications on file prior to visit.     Allergies  Allergen Reactions  . Penicillins Other (See Comments)    Unknown childhood allergic reaction - no other information available    There were no vitals taken for this visit.  No  problem-specific Assessment & Plan notes found for this encounter.   Jinna Weinman Rodriguez-Guzman PharmD, BCPS, CPP [facility] [address] [place] [address] 11/18/2017 11:59 AM

## 2017-11-18 NOTE — Progress Notes (Signed)
Patient ID: [name]                 DOB: 11-Sep-1955                      MRN: [id number]     HPI: Grant Williams is a 62 y.o. male referred by Dr. Allyson Sabal to HTN clinic. In addition to hypertension, his PMH includes hyperlipidemia, CAD (moderate diagonal branch disease - treated medically), and diabetes (A1c 7.3 -May 2019).  Patient had bariatric surgery in March 2018 and lost 48 pounds so far.  He saw Raquel Rodriguez-Guzman last month in CVRR clinic.  He was doing well, but blood pressure was unchanged at 140/82.  At that time she increased his amlodipine to 10 mg daily.  He presents to day for follow up    He has no complaints of CP, SOB, LEE, dizziness or lightheadedness; He does endorse occasional balance issues, but as he just had dose a increase in Lyrica, he states that this is not an uncommon thing.    Current HTN meds:  Amlodipine 10 mg daily - pm Metoprolol succinate  daily - 100 am, 50 hs Diovan-HCT 320-12.5mg  daily  - am  BP goal: <130/80  Family History: HTN from father,   Social History: denies caffeine, no alcohol , denies tobacco  Diet: eats every 8 hours, no fried food, low fat , low carbs, increased protein, likes salty foods; mostly home cooked  Exercise: exercise ~ 3x/week, mainly resistance, some walking  ([address] driveway, walks back/forth 2-3 x, exercise bike on rainy days.   Has been avoiding using his golf cart for getting around on his property  Home BP readings:    Home cuff manual arm cuff, about 32-66 years old; last 7 readings (over 2 weeks) 147/76 (previously average 160/96)  Wt Readings from Last 3 Encounters:  10/26/17 240 lb 3.2 oz (109 kg)  09/21/17 246 lb (111.6 kg)  04/12/17 245 lb 2 oz (111.2 kg)   BP Readings from Last 3 Encounters:  11/18/17 130/78  10/26/17 138/68  10/21/17 140/82   Pulse Readings from Last 3 Encounters:  11/18/17 64  10/26/17 61  10/21/17 70     Past Medical History:  Diagnosis Date  . Coronary artery  disease   . Cough syncope   . Diabetes mellitus without complication (HCC)   . Fibromyalgia   . HTN (hypertension)   . Hyperlipidemia   . Hypertension   . Morbid obesity (HCC)   . SOB (shortness of breath)   . Uncontrolled type 2 diabetes mellitus with complication (HCC)   . Vitamin B12 deficiency     Current Outpatient Medications on File Prior to Visit  Medication Sig Dispense Refill  . acetaminophen (TYLENOL) 500 MG tablet Take 1 tablet by mouth every 6 (six) hours as needed.    Marland Kitchen amLODipine (NORVASC) 10 MG tablet Take 1 tablet (10 mg total) by mouth daily. 90 tablet 1  . ARIPiprazole (ABILIFY) 5 MG tablet Take 5 mg by mouth daily.     . Calcium Citrate-Vitamin D 200-250 MG-UNIT TABS Take 1 tablet by mouth 2 (two) times daily.    Marland Kitchen docusate sodium (COLACE) 100 MG capsule Take 100 mg by mouth 2 (two) times daily.    . Dulaglutide (TRULICITY) 0.75 MG/0.5ML SOPN Inject 0.75 mg into the skin once a week. 12 pen 3  . DULoxetine (CYMBALTA) 60 MG capsule Take 60 mg by mouth 2 (two) times  daily.     . ergocalciferol (VITAMIN D2) 50000 units capsule Take 50,000 Units by mouth 2 (two) times a week.     . gabapentin (NEURONTIN) 400 MG capsule Take 800 mg by mouth 4 (four) times daily.     Marland Kitchen glucose blood (ONETOUCH VERIO) test strip 1 each by Other route daily. use TO CHECK BLOOD SUGAR three times a day 100 each 3  . iron polysaccharides (NIFEREX) 150 MG capsule Take 1 capsule by mouth 2 (two) times daily.    . metFORMIN (GLUCOPHAGE) 500 MG tablet Take 2 tablets (1,000 mg total) by mouth 2 (two) times daily with a meal. 360 tablet 3  . methocarbamol (ROBAXIN) 500 MG tablet Take 500-1,000 mg by mouth See admin instructions. Take 1 tablet (500 mg) by mouth every morning, take 1 tablet 8 hours later and then 2 tablets 8 hours after 2nd dose    . metoprolol (LOPRESSOR) 100 MG tablet Take 100 mg by mouth daily.    . Multiple Vitamin (MULTIVITAMIN) tablet Take 1 tablet by mouth daily.    .  pantoprazole (PROTONIX) 40 MG tablet Take 40 mg by mouth daily.     . pregabalin (LYRICA) 150 MG capsule Take 150 mg by mouth 3 (three) times daily.    Marland Kitchen rOPINIRole (REQUIP) 1 MG tablet Take 1 mg by mouth at bedtime.     . traZODone (DESYREL) 50 MG tablet Take 50 mg by mouth at bedtime as needed for sleep.    . valsartan-hydrochlorothiazide (DIOVAN-HCT) 320-12.5 MG tablet Take 1 tablet by mouth daily.     No current facility-administered medications on file prior to visit.     Allergies  Allergen Reactions  . Penicillins Other (See Comments)    Unknown childhood allergic reaction - no other information available    Blood pressure 130/78, pulse 64.  HTN (hypertension) Patient with essential hypertension, now doing better on his current regimen.  Will have him continue with this plan and encouraged him to continue with his lifestyle modifications.  Assured him that as he continues to lose weight his blood pressure should remain well controlled.     Phillips Hay PharmD CPP [facility] [address] [place] [address] 11/19/2017 5:03 PM

## 2017-11-19 ENCOUNTER — Encounter: Payer: Self-pay | Admitting: Pharmacist Clinician (PhC)/ Clinical Pharmacy Specialist

## 2017-11-19 NOTE — Assessment & Plan Note (Signed)
Patient with essential hypertension, now doing better on his current regimen.  Will have him continue with this plan and encouraged him to continue with his lifestyle modifications.  Assured him that as he continues to lose weight his blood pressure should remain well controlled.

## 2018-01-18 LAB — HM DIABETES EYE EXAM

## 2018-01-31 ENCOUNTER — Encounter: Payer: Self-pay | Admitting: Endocrinology

## 2018-01-31 ENCOUNTER — Ambulatory Visit (INDEPENDENT_AMBULATORY_CARE_PROVIDER_SITE_OTHER): Payer: Commercial Managed Care - PPO | Admitting: Endocrinology

## 2018-01-31 VITALS — BP 149/87 | HR 67 | Ht 68.5 in | Wt 239.0 lb

## 2018-01-31 DIAGNOSIS — E1159 Type 2 diabetes mellitus with other circulatory complications: Secondary | ICD-10-CM

## 2018-01-31 DIAGNOSIS — Z794 Long term (current) use of insulin: Secondary | ICD-10-CM | POA: Diagnosis not present

## 2018-01-31 DIAGNOSIS — E669 Obesity, unspecified: Secondary | ICD-10-CM

## 2018-01-31 DIAGNOSIS — E119 Type 2 diabetes mellitus without complications: Secondary | ICD-10-CM

## 2018-01-31 DIAGNOSIS — I2510 Atherosclerotic heart disease of native coronary artery without angina pectoris: Secondary | ICD-10-CM

## 2018-01-31 LAB — POCT GLYCOSYLATED HEMOGLOBIN (HGB A1C): Hemoglobin A1C: 7.2 % — AB (ref 4.0–5.6)

## 2018-01-31 MED ORDER — DULAGLUTIDE 1.5 MG/0.5ML ~~LOC~~ SOAJ: 1.5000 mg | SUBCUTANEOUS | 11 refills | Status: DC

## 2018-01-31 NOTE — Patient Instructions (Addendum)
Your blood pressure is high today.  Please see your primary care provider soon, to have it rechecked check your blood sugar once a day.  vary the time of day when you check, between before the 3 meals, and at bedtime.  also check if you have symptoms of your blood sugar being too high or too low.  please keep a record of the readings and bring it to your next appointment here.  You can write it on any piece of paper.  please call us sooner if your blood sugar goes below 70, or if you have a lot of readings over 200.  I have sent a prescription to your pharmacy, to double the trulicity.  Please continue the same metformin Please come back for a follow-up appointment in 4 months.

## 2018-01-31 NOTE — Progress Notes (Signed)
Subjective:    Patient ID: Grant Williams, male    DOB: 20-Oct-1955, 62 y.o.   MRN: [id number]  HPI Pt returns for f/u of diabetes mellitus:  DM type: 2. Dx'ed: 2008.   Complications: polyneuropathy, nephropathy, and CAD.   Therapy: trulicity and metformin   DKA: never.  Severe hypoglycemia: never.   Pancreatitis: never.    Other: He took insulin 2015-2018; he had gastric bypass surgery in 2018. Interval history: He has lost 52 lbs so far.  He says cbg's are in the mid-100's. pt states he feels well in general.    Past Medical History:  Diagnosis Date  . Coronary artery disease   . Cough syncope   . Diabetes mellitus without complication (HCC)   . Fibromyalgia   . HTN (hypertension)   . Hyperlipidemia   . Hypertension   . Morbid obesity (HCC)   . SOB (shortness of breath)   . Uncontrolled type 2 diabetes mellitus with complication (HCC)   . Vitamin B12 deficiency     Past Surgical History:  Procedure Laterality Date  . ELBOW SURGERY Right   . HERNIA REPAIR    . SHOULDER ARTHROSCOPY Right     Social History   Socioeconomic History  . Marital status: Married    Spouse name: Not on file  . Number of children: Not on file  . Years of education: Not on file  . Highest education level: Not on file  Occupational History  . Not on file  Social Needs  . Financial resource strain: Not on file  . Food insecurity:    Worry: Not on file    Inability: Not on file  . Transportation needs:    Medical: Not on file    Non-medical: Not on file  Tobacco Use  . Smoking status: Former Games developermoker  . Smokeless tobacco: Current User    Types: Chew  . Tobacco comment: 30 years ago  Substance and Sexual Activity  . Alcohol use: No  . Drug use: No  . Sexual activity: Not Currently  Lifestyle  . Physical activity:    Days per week: Not on file    Minutes per session: Not on file  . Stress: Not on file  Relationships  . Social connections:    Talks on phone: Not on file   Gets together: Not on file    Attends religious service: Not on file    Active member of club or organization: Not on file    Attends meetings of clubs or organizations: Not on file    Relationship status: Not on file  . Intimate partner violence:    Fear of current or ex partner: Not on file    Emotionally abused: Not on file    Physically abused: Not on file    Forced sexual activity: Not on file  Other Topics Concern  . Not on file  Social History Narrative  . Not on file    Current Outpatient Medications on File Prior to Visit  Medication Sig Dispense Refill  . acetaminophen (TYLENOL) 500 MG tablet Take 1 tablet by mouth every 6 (six) hours as needed.    Marland Kitchen. amLODipine (NORVASC) 10 MG tablet Take 1 tablet (10 mg total) by mouth daily. 90 tablet 1  . ARIPiprazole (ABILIFY) 5 MG tablet Take 5 mg by mouth daily.     . Calcium Citrate-Vitamin D 200-250 MG-UNIT TABS Take 1 tablet by mouth 2 (two) times daily.    Marland Kitchen. docusate sodium (  COLACE) 100 MG capsule Take 100 mg by mouth 2 (two) times daily.    . DULoxetine (CYMBALTA) 60 MG capsule Take 60 mg by mouth 2 (two) times daily.     . ergocalciferol (VITAMIN D2) 50000 units capsule Take 50,000 Units by mouth 2 (two) times a week.     . gabapentin (NEURONTIN) 400 MG capsule Take 800 mg by mouth 4 (four) times daily.     Marland Kitchen. glucose blood (ONETOUCH VERIO) test strip 1 each by Other route daily. use TO CHECK BLOOD SUGAR three times a day 100 each 3  . iron polysaccharides (NIFEREX) 150 MG capsule Take 1 capsule by mouth 2 (two) times daily.    . metFORMIN (GLUCOPHAGE) 500 MG tablet Take 2 tablets (1,000 mg total) by mouth 2 (two) times daily with a meal. 360 tablet 3  . methocarbamol (ROBAXIN) 500 MG tablet Take 500-1,000 mg by mouth See admin instructions. Take 1 tablet (500 mg) by mouth every morning, take 1 tablet 8 hours later and then 2 tablets 8 hours after 2nd dose    . metoprolol (LOPRESSOR) 100 MG tablet Take 100 mg by mouth daily.    .  Multiple Vitamin (MULTIVITAMIN) tablet Take 1 tablet by mouth daily.    . pantoprazole (PROTONIX) 40 MG tablet Take 40 mg by mouth daily.     . pregabalin (LYRICA) 150 MG capsule Take 150 mg by mouth 3 (three) times daily.    Marland Kitchen. rOPINIRole (REQUIP) 1 MG tablet Take 1 mg by mouth at bedtime.     . traZODone (DESYREL) 50 MG tablet Take 50 mg by mouth at bedtime as needed for sleep.    . valsartan-hydrochlorothiazide (DIOVAN-HCT) 320-12.5 MG tablet Take 1 tablet by mouth daily.     No current facility-administered medications on file prior to visit.     Allergies  Allergen Reactions  . Penicillins Other (See Comments)    Unknown childhood allergic reaction - no other information available    Family History  Problem Relation Age of Onset  . Diabetes Father     BP (!) 149/87 (BP Location: Left Arm, Patient Position: Sitting, Cuff Size: Normal)   Pulse 67   Ht 5' 8.5" (1.74 m)   Wt 239 lb (108.4 kg)   SpO2 95%   BMI 35.81 kg/m    Review of Systems No weight change    Objective:   Physical Exam VITAL SIGNS:  See vs page GENERAL: no distress Pulses: dorsalis pedis intact bilat.  MSK: no deformity of the feet CV: trace bilat leg edema Skin: no ulcer on the feet. normal color and temp on the feet, but there is spotty hyperpigmentation of the legs.   Neuro: sensation is intact to touch on the feet, but decreased from normal.   Lab Results  Component Value Date   HGBA1C 7.2 (A) 01/31/2018        Assessment & Plan:  Obesity: stable.  Increasing trulicity might help.   Insulin-requiring type 2 DM, with CAD: improved.   Patient Instructions  Your blood pressure is high today.  Please see your primary care provider soon, to have it rechecked check your blood sugar once a day.  vary the time of day when you check, between before the 3 meals, and at bedtime.  also check if you have symptoms of your blood sugar being too high or too low.  please keep a record of the readings  and bring it to your next appointment here.  You  can write it on any piece of paper.  please call us sooner if your blood sugar goes below 70, or if you have a lot of readings over 200.  I have sent a prescription to your pharmacy, to double the trulicity.  Please continue the same metformin Please come back for a follow-up appointment in 4 months.

## 2018-03-02 ENCOUNTER — Ambulatory Visit: Payer: Commercial Managed Care - PPO | Admitting: Endocrinology

## 2018-03-24 ENCOUNTER — Other Ambulatory Visit: Payer: Self-pay | Admitting: Endocrinology

## 2018-03-26 ENCOUNTER — Other Ambulatory Visit: Payer: Self-pay | Admitting: Endocrinology

## 2018-04-22 ENCOUNTER — Other Ambulatory Visit: Payer: Self-pay | Admitting: Endocrinology

## 2018-04-27 ENCOUNTER — Other Ambulatory Visit: Payer: Self-pay | Admitting: Cardiovascular Disease

## 2018-04-27 NOTE — Telephone Encounter (Signed)
Rx request sent to pharmacy.  

## 2018-05-17 ENCOUNTER — Other Ambulatory Visit: Payer: Self-pay | Admitting: Endocrinology

## 2018-06-03 ENCOUNTER — Encounter: Payer: Self-pay | Admitting: Endocrinology

## 2018-06-03 ENCOUNTER — Ambulatory Visit (INDEPENDENT_AMBULATORY_CARE_PROVIDER_SITE_OTHER): Payer: Commercial Managed Care - PPO | Admitting: Endocrinology

## 2018-06-03 VITALS — BP 158/80 | HR 59 | Ht 68.5 in | Wt 237.8 lb

## 2018-06-03 DIAGNOSIS — E119 Type 2 diabetes mellitus without complications: Secondary | ICD-10-CM

## 2018-06-03 DIAGNOSIS — I2510 Atherosclerotic heart disease of native coronary artery without angina pectoris: Secondary | ICD-10-CM

## 2018-06-03 DIAGNOSIS — Z794 Long term (current) use of insulin: Secondary | ICD-10-CM | POA: Diagnosis not present

## 2018-06-03 MED ORDER — DAPAGLIFLOZIN PROPANEDIOL 5 MG PO TABS: 2.5000 mg | ORAL_TABLET | Freq: Every day | ORAL | 11 refills | Status: DC

## 2018-06-03 NOTE — Progress Notes (Signed)
Subjective:    Patient ID: Grant Williams, male    DOB: May 26, 1956, 62 y.o.   MRN: [id number]  HPI Pt returns for f/u of diabetes mellitus:  DM type: 2. Dx'ed: 2008.   Complications: polyneuropathy, nephropathy, and CAD.   Therapy: trulicity and metformin   DKA: never.  Severe hypoglycemia: never.   Pancreatitis: never.    Other: He took insulin 2015-2018; he had gastric bypass surgery in 2018; he did no tolerate farxiga (polyuria).  Interval history: He has lost 54 lbs so far.  He says cbg's are in the mid-100's. pt states he feels well in general.   Past Medical History:  Diagnosis Date  . Coronary artery disease   . Cough syncope   . Diabetes mellitus without complication (HCC)   . Fibromyalgia   . HTN (hypertension)   . Hyperlipidemia   . Hypertension   . Morbid obesity (HCC)   . SOB (shortness of breath)   . Uncontrolled type 2 diabetes mellitus with complication (HCC)   . Vitamin B12 deficiency     Past Surgical History:  Procedure Laterality Date  . ELBOW SURGERY Right   . HERNIA REPAIR    . SHOULDER ARTHROSCOPY Right     Social History   Socioeconomic History  . Marital status: Married    Spouse name: Not on file  . Number of children: Not on file  . Years of education: Not on file  . Highest education level: Not on file  Occupational History  . Not on file  Social Needs  . Financial resource strain: Not on file  . Food insecurity:    Worry: Not on file    Inability: Not on file  . Transportation needs:    Medical: Not on file    Non-medical: Not on file  Tobacco Use  . Smoking status: Former Games developer  . Smokeless tobacco: Current User    Types: Chew  . Tobacco comment: 30 years ago  Substance and Sexual Activity  . Alcohol use: No  . Drug use: No  . Sexual activity: Not Currently  Lifestyle  . Physical activity:    Days per week: Not on file    Minutes per session: Not on file  . Stress: Not on file  Relationships  . Social  connections:    Talks on phone: Not on file    Gets together: Not on file    Attends religious service: Not on file    Active member of club or organization: Not on file    Attends meetings of clubs or organizations: Not on file    Relationship status: Not on file  . Intimate partner violence:    Fear of current or ex partner: Not on file    Emotionally abused: Not on file    Physically abused: Not on file    Forced sexual activity: Not on file  Other Topics Concern  . Not on file  Social History Narrative  . Not on file    Current Outpatient Medications on File Prior to Visit  Medication Sig Dispense Refill  . acetaminophen (TYLENOL) 500 MG tablet Take 1 tablet by mouth every 6 (six) hours as needed.    Marland Kitchen amLODipine (NORVASC) 10 MG tablet Take 1 tablet (10 mg total) by mouth daily. 90 tablet 0  . ARIPiprazole (ABILIFY) 5 MG tablet Take 5 mg by mouth daily.     . Calcium Citrate-Vitamin D 200-250 MG-UNIT TABS Take 1 tablet by mouth 2 (two)  times daily.    Marland Kitchen docusate sodium (COLACE) 100 MG capsule Take 100 mg by mouth 2 (two) times daily.    . DULoxetine (CYMBALTA) 60 MG capsule Take 60 mg by mouth 2 (two) times daily.     . ergocalciferol (VITAMIN D2) 50000 units capsule Take 50,000 Units by mouth 2 (two) times a week.     . gabapentin (NEURONTIN) 400 MG capsule Take 800 mg by mouth 4 (four) times daily.     Marland Kitchen glucose blood (ONETOUCH VERIO) test strip 1 each by Other route daily. use TO CHECK BLOOD SUGAR three times a day 100 each 3  . iron polysaccharides (NIFEREX) 150 MG capsule Take 1 capsule by mouth 2 (two) times daily.    . metFORMIN (GLUCOPHAGE) 500 MG tablet TAKE 2 TABLETS BY MOUTH TWICE DAILY WITH A MEAL (Patient taking differently: Take 500 mg by mouth 2 (two) times daily. Take 2 tablets BID) 360 tablet 0  . methocarbamol (ROBAXIN) 500 MG tablet Take 500-1,000 mg by mouth See admin instructions. Take 1 tablet (500 mg) by mouth every morning, take 1 tablet 8 hours later and  then 2 tablets 8 hours after 2nd dose    . metoprolol (LOPRESSOR) 100 MG tablet Take 100 mg by mouth daily.    . Multiple Vitamin (MULTIVITAMIN) tablet Take 1 tablet by mouth daily.    . pantoprazole (PROTONIX) 40 MG tablet Take 40 mg by mouth daily.     . pregabalin (LYRICA) 150 MG capsule Take 150 mg by mouth 3 (three) times daily.    Marland Kitchen rOPINIRole (REQUIP) 1 MG tablet Take 1 mg by mouth at bedtime.     . traZODone (DESYREL) 50 MG tablet Take 50 mg by mouth at bedtime as needed for sleep.    . TRULICITY 1.5 MG/0.5ML SOPN INJECT 1.5MG  INTO THE SKIN ONCE A WEEK (Patient taking differently: Inject 1.5 mg into the skin once a week. ) 2 mL 0  . valsartan-hydrochlorothiazide (DIOVAN-HCT) 320-12.5 MG tablet Take 1 tablet by mouth daily.     No current facility-administered medications on file prior to visit.     Allergies  Allergen Reactions  . Penicillins Other (See Comments)    Unknown childhood allergic reaction - no other information available    Family History  Problem Relation Age of Onset  . Diabetes Father     BP (!) 158/80 (BP Location: Right Arm, Patient Position: Sitting, Cuff Size: Large) Comment: has not taken antihypertensive  Pulse (!) 59   Ht 5' 8.5" (1.74 m)   Wt 237 lb 12.8 oz (107.9 kg)   SpO2 94%   BMI 35.63 kg/m    Review of Systems Denies nausea.      Objective:   Physical Exam VITAL SIGNS:  See vs page GENERAL: no distress Pulses: dorsalis pedis intact bilat.  MSK: no deformity of the feet CV: trace bilat leg edema Skin: no ulcer on the feet. normal color and temp on the feet, but there is spotty hyperpigmentation of the legs.   Neuro: sensation is intact to touch on the feet, but decreased from normal.    A1c=6.4%  Lab Results  Component Value Date   CREATININE 1.00 02/10/2016   BUN 11 02/10/2016   NA 138 02/10/2016   K 3.7 02/10/2016   CL 105 02/10/2016   CO2 26 02/10/2016       Assessment & Plan:  Type 2 DM, with DN:  well-controlled Obesity: much better after surgery.  We discussed.  We are adding farxiga to help.   HTN: is noted today  Patient Instructions  Your blood pressure is high today.  Please see your primary care provider soon, to have it rechecked.  check your blood sugar once a day.  vary the time of day when you check, between before the 3 meals, and at bedtime.  also check if you have symptoms of your blood sugar being too high or too low.  please keep a record of the readings and bring it to your next appointment here.  You can write it on any piece of paper.  please call us sooner if your blood sugar goes below 70, or if you have a lot of readings over 200.   I have sent a prescription to your pharmacy, to add a small amount of "Farxiga." Please continue the same other medications for diabetes.   Please come back for a follow-up appointment in 4 months.

## 2018-06-03 NOTE — Patient Instructions (Addendum)
Your blood pressure is high today.  Please see your primary care provider soon, to have it rechecked.  check your blood sugar once a day.  vary the time of day when you check, between before the 3 meals, and at bedtime.  also check if you have symptoms of your blood sugar being too high or too low.  please keep a record of the readings and bring it to your next appointment here.  You can write it on any piece of paper.  please call us sooner if your blood sugar goes below 70, or if you have a lot of readings over 200.   I have sent a prescription to your pharmacy, to add a small amount of "Farxiga." Please continue the same other medications for diabetes.   Please come back for a follow-up appointment in 4 months.

## 2018-06-07 ENCOUNTER — Telehealth: Payer: Self-pay

## 2018-06-07 NOTE — Telephone Encounter (Signed)
[facility] that a PA is required for [place]. However, [facility] is no longer contracted with Medicare. Called pt to inquire if he would like Rx sent to an alternate pharmacy. LVM requesting returned call. PA on hold pending pt response.

## 2018-06-08 ENCOUNTER — Telehealth: Payer: Self-pay | Admitting: Endocrinology

## 2018-06-08 NOTE — Telephone Encounter (Signed)
Patient returned a call to Ammie ZO:[id number]:conversation from 06/07/18. Requesting a call back to [phone]

## 2018-06-09 NOTE — Telephone Encounter (Signed)
PA initiated today through Cover My Meds for [place]xiga. Will await insurance response re: approval/denial.

## 2018-06-12 ENCOUNTER — Other Ambulatory Visit: Payer: Self-pay | Admitting: Endocrinology

## 2018-06-20 ENCOUNTER — Telehealth: Payer: Self-pay | Admitting: Endocrinology

## 2018-06-20 MED ORDER — CANAGLIFLOZIN 100 MG PO TABS: 100.0000 mg | ORAL_TABLET | Freq: Every day | ORAL | 3 refills | Status: DC

## 2018-06-20 NOTE — Telephone Encounter (Signed)
please call patient: Ins prefers invokana.  I have sent a prescription to your pharmacy.  I'll see you next time.

## 2018-06-20 NOTE — Telephone Encounter (Signed)
Called pt to inform about denial for [place]xiga. Also wanted to inform about insurance request to change to [facility] as this is deemed a preferred medication on his plan. LVM requesting returned call.

## 2018-06-20 NOTE — Telephone Encounter (Signed)
Received notification from Cover My Meds for Farxiga indicating PA was denied. Placed documentation on Dr. Rylend HughEllison's desk to address denial and his plan for an alternative treatment. Will await his response.

## 2018-06-21 NOTE — Telephone Encounter (Signed)
Patient informed of the message below and he agrees to the AutoZonenvokana

## 2018-06-21 NOTE — Telephone Encounter (Signed)
LMTCB

## 2018-06-24 ENCOUNTER — Telehealth: Payer: Self-pay | Admitting: Endocrinology

## 2018-06-24 NOTE — Telephone Encounter (Signed)
Patients wife has called in regards to the patient thinking they are supposed to stop taking the Trucility. Please Advise with wife and what they need to be doing with RX's, thanks

## 2018-06-24 NOTE — Telephone Encounter (Signed)
Please advise 

## 2018-06-24 NOTE — Telephone Encounter (Signed)
As far as I know, you should continue the trulicity.  Does wife know of a reason to stop?

## 2018-06-27 ENCOUNTER — Other Ambulatory Visit: Payer: Self-pay

## 2018-06-27 DIAGNOSIS — E119 Type 2 diabetes mellitus without complications: Secondary | ICD-10-CM

## 2018-06-27 DIAGNOSIS — Z794 Long term (current) use of insulin: Principal | ICD-10-CM

## 2018-06-27 MED ORDER — GLUCOSE BLOOD VI STRP: 1.0000 | ORAL_STRIP | Freq: Every day | 11 refills | Status: DC

## 2018-06-27 NOTE — Telephone Encounter (Signed)
Returned pt call to inquire further. LVM requesting returned call. °

## 2018-06-27 NOTE — Telephone Encounter (Signed)
Pt returned call. Informed per Dr. Arcangel Hugh orders, continue Trulicity unless there is some reason it should be stopped. Pt states he did not know of any reason to stop medication. Confirmed he will take as prescribed. While on the phone, pt asked for refill of his test strips. Refilled per protocol.

## 2018-07-01 ENCOUNTER — Other Ambulatory Visit: Payer: Self-pay | Admitting: Endocrinology

## 2018-07-20 ENCOUNTER — Other Ambulatory Visit: Payer: Self-pay | Admitting: Cardiovascular Disease

## 2018-09-25 ENCOUNTER — Other Ambulatory Visit: Payer: Self-pay | Admitting: Endocrinology

## 2018-10-05 ENCOUNTER — Ambulatory Visit: Payer: Commercial Managed Care - PPO | Admitting: Endocrinology

## 2018-10-07 ENCOUNTER — Encounter: Payer: Self-pay | Admitting: Endocrinology

## 2018-10-10 ENCOUNTER — Ambulatory Visit (INDEPENDENT_AMBULATORY_CARE_PROVIDER_SITE_OTHER): Payer: Commercial Managed Care - PPO | Admitting: Endocrinology

## 2018-10-10 ENCOUNTER — Other Ambulatory Visit: Payer: Self-pay

## 2018-10-10 DIAGNOSIS — Z794 Long term (current) use of insulin: Secondary | ICD-10-CM

## 2018-10-10 DIAGNOSIS — E1159 Type 2 diabetes mellitus with other circulatory complications: Secondary | ICD-10-CM

## 2018-10-10 DIAGNOSIS — E1140 Type 2 diabetes mellitus with diabetic neuropathy, unspecified: Secondary | ICD-10-CM

## 2018-10-10 DIAGNOSIS — E119 Type 2 diabetes mellitus without complications: Secondary | ICD-10-CM

## 2018-10-10 NOTE — Patient Instructions (Signed)
Please continue the same medications.   Please come back for a follow-up appointment in 2 months.  

## 2018-10-10 NOTE — Progress Notes (Signed)
Subjective:    Patient ID: Grant Williams, male    DOB: Mar 14, 1956, 63 y.o.   MRN: [id number]  HPI  telehealth visit today via doxy video visit.  Alternatives to telehealth are presented to this patient, and the patient agrees to the telehealth visit. She is advised of the cost of the visit, and she agrees to this, also.   Patient is at home, and I am at the office.   Pt returns for f/u of diabetes mellitus:  DM type: 2. Dx'ed: 2008.   Complications: polyneuropathy, nephropathy, and CAD.   Therapy: trulicity and 2 oral meds.     DKA: never.  Severe hypoglycemia: never.   Pancreatitis: never.    Other: He took insulin 2015-2018; he had gastric bypass surgery in 2018; he did no tolerate farxiga (polyuria).  Interval history: He has lost more weight.  He says cbg's vary form 100-149. pt states he feels well in general.  Past Medical History:  Diagnosis Date   Coronary artery disease    Cough syncope    Diabetes mellitus without complication (HCC)    Fibromyalgia    HTN (hypertension)    Hyperlipidemia    Hypertension    Morbid obesity (HCC)    SOB (shortness of breath)    Uncontrolled type 2 diabetes mellitus with complication (HCC)    Vitamin B12 deficiency     Past Surgical History:  Procedure Laterality Date   ELBOW SURGERY Right    HERNIA REPAIR     SHOULDER ARTHROSCOPY Right     Social History   Socioeconomic History   Marital status: Married    Spouse name: Not on file   Number of children: Not on file   Years of education: Not on file   Highest education level: Not on file  Occupational History   Not on file  Social Needs   Financial resource strain: Not on file   Food insecurity:    Worry: Not on file    Inability: Not on file   Transportation needs:    Medical: Not on file    Non-medical: Not on file  Tobacco Use   Smoking status: Former Smoker   Smokeless tobacco: Current User    Types: Chew   Tobacco comment: 30  years ago  Substance and Sexual Activity   Alcohol use: No   Drug use: No   Sexual activity: Not Currently  Lifestyle   Physical activity:    Days per week: Not on file    Minutes per session: Not on file   Stress: Not on file  Relationships   Social connections:    Talks on phone: Not on file    Gets together: Not on file    Attends religious service: Not on file    Active member of club or organization: Not on file    Attends meetings of clubs or organizations: Not on file    Relationship status: Not on file   Intimate partner violence:    Fear of current or ex partner: Not on file    Emotionally abused: Not on file    Physically abused: Not on file    Forced sexual activity: Not on file  Other Topics Concern   Not on file  Social History Narrative   Not on file    Current Outpatient Medications on File Prior to Visit  Medication Sig Dispense Refill   acetaminophen (TYLENOL) 500 MG tablet Take 1 tablet by mouth every 6 (six) hours  as needed.     amLODipine (NORVASC) 10 MG tablet TAKE 1 TABLET BY MOUTH DAILY 90 tablet 0   ARIPiprazole (ABILIFY) 5 MG tablet Take 5 mg by mouth daily.      Calcium Citrate-Vitamin D 200-250 MG-UNIT TABS Take 1 tablet by mouth 2 (two) times daily.     canagliflozin (INVOKANA) 100 MG TABS tablet Take 1 tablet (100 mg total) by mouth daily before breakfast. 90 tablet 3   docusate sodium (COLACE) 100 MG capsule Take 100 mg by mouth 2 (two) times daily.     Dulaglutide (TRULICITY) 1.5 MG/0.5ML SOPN Inject 1.5 mg into the skin once a week. 12 pen 3   DULoxetine (CYMBALTA) 60 MG capsule Take 60 mg by mouth 2 (two) times daily.      ergocalciferol (VITAMIN D2) 50000 units capsule Take 50,000 Units by mouth 2 (two) times a week.      gabapentin (NEURONTIN) 400 MG capsule Take 800 mg by mouth 4 (four) times daily.      glucose blood (ONETOUCH VERIO) test strip 1 each by Other route daily. use TO CHECK BLOOD SUGAR every day 50 each 11     metFORMIN (GLUCOPHAGE) 500 MG tablet TAKE 2 TABLETS BY MOUTH TWICE DAILY WITH MEALS 360 tablet 0   methocarbamol (ROBAXIN) 500 MG tablet Take 500-1,000 mg by mouth See admin instructions. Take 1 tablet (500 mg) by mouth every morning, take 1 tablet 8 hours later and then 2 tablets 8 hours after 2nd dose     metoprolol (LOPRESSOR) 100 MG tablet Take 100 mg by mouth daily.     Multiple Vitamin (MULTIVITAMIN) tablet Take 1 tablet by mouth daily.     pantoprazole (PROTONIX) 40 MG tablet Take 40 mg by mouth daily.      pregabalin (LYRICA) 150 MG capsule Take 150 mg by mouth 3 (three) times daily.     rOPINIRole (REQUIP) 1 MG tablet Take 1 mg by mouth at bedtime.      traZODone (DESYREL) 50 MG tablet Take 50 mg by mouth at bedtime as needed for sleep.     valsartan-hydrochlorothiazide (DIOVAN-HCT) 320-12.5 MG tablet Take 1 tablet by mouth daily.     iron polysaccharides (NIFEREX) 150 MG capsule Take 1 capsule by mouth 2 (two) times daily.     No current facility-administered medications on file prior to visit.     Allergies  Allergen Reactions   Penicillins Other (See Comments)    Unknown childhood allergic reaction - no other information available    Family History  Problem Relation Age of Onset   Diabetes Father    Review of Systems Denies vomiting.    Objective:   Physical Exam  Lab Results  Component Value Date   CREATININE 1.00 02/10/2016   BUN 11 02/10/2016   NA 138 02/10/2016   K 3.7 02/10/2016   CL 105 02/10/2016   CO2 26 02/10/2016       Assessment & Plan:  Type 2 DM, with PN: apparently well-controlled.  We discussed.  He declines a1c today CAD: in this context, he needs a DM regimen that minimizes the risk of hypoglycemia.  Weight loss: pt is advised to continue his efforts.    Patient Instructions  Please continue the same medications.   Please come back for a follow-up appointment in 2 months.

## 2018-10-14 ENCOUNTER — Other Ambulatory Visit: Payer: Self-pay | Admitting: Cardiovascular Disease

## 2018-10-14 NOTE — Telephone Encounter (Signed)
Amlodipine 10 mg refilled. 

## 2018-10-24 ENCOUNTER — Telehealth: Payer: Self-pay

## 2018-10-24 NOTE — Telephone Encounter (Signed)
LOV 10/10/18. Per Dr. Everardo All, follow-up appointment needed in 2 months. LVM requesting returned call.

## 2018-12-07 DIAGNOSIS — M7581 Other shoulder lesions, right shoulder: Secondary | ICD-10-CM | POA: Insufficient documentation

## 2018-12-16 DIAGNOSIS — E559 Vitamin D deficiency, unspecified: Secondary | ICD-10-CM | POA: Insufficient documentation

## 2018-12-19 ENCOUNTER — Other Ambulatory Visit: Payer: Self-pay | Admitting: Endocrinology

## 2018-12-26 ENCOUNTER — Ambulatory Visit: Payer: Commercial Managed Care - PPO | Admitting: Endocrinology

## 2018-12-26 DIAGNOSIS — E611 Iron deficiency: Secondary | ICD-10-CM | POA: Insufficient documentation

## 2019-01-02 ENCOUNTER — Other Ambulatory Visit: Payer: Self-pay

## 2019-01-03 ENCOUNTER — Telehealth: Payer: Self-pay | Admitting: Endocrinology

## 2019-01-03 ENCOUNTER — Other Ambulatory Visit: Payer: Self-pay

## 2019-01-03 ENCOUNTER — Other Ambulatory Visit: Payer: Commercial Managed Care - PPO

## 2019-01-03 DIAGNOSIS — Z20822 Contact with and (suspected) exposure to covid-19: Secondary | ICD-10-CM

## 2019-01-03 NOTE — Telephone Encounter (Signed)
Spoke with Joelene Millin and she understood.

## 2019-01-03 NOTE — Telephone Encounter (Signed)
Please be aware of Dr. Cordelia Pen request

## 2019-01-03 NOTE — Telephone Encounter (Signed)
Until test is neg, our visit needs to be virtual only.  We are not involved in the testing.

## 2019-01-03 NOTE — Telephone Encounter (Signed)
Patients wife Joelene Millin called in regards to her being in contact with someone with COVID. Joelene Millin would like the patient to also get tested. The lady who scheduled her for a 3 o'clock appointment stated a doctor in the [facility] system could go in and approve him to being tested today also.  Can this be done?

## 2019-01-05 ENCOUNTER — Ambulatory Visit (INDEPENDENT_AMBULATORY_CARE_PROVIDER_SITE_OTHER): Payer: Commercial Managed Care - PPO | Admitting: Endocrinology

## 2019-01-05 ENCOUNTER — Other Ambulatory Visit: Payer: Self-pay

## 2019-01-05 ENCOUNTER — Encounter: Payer: Self-pay | Admitting: Endocrinology

## 2019-01-05 DIAGNOSIS — R609 Edema, unspecified: Secondary | ICD-10-CM

## 2019-01-05 DIAGNOSIS — E669 Obesity, unspecified: Secondary | ICD-10-CM

## 2019-01-05 DIAGNOSIS — E119 Type 2 diabetes mellitus without complications: Secondary | ICD-10-CM

## 2019-01-05 DIAGNOSIS — Z794 Long term (current) use of insulin: Secondary | ICD-10-CM | POA: Diagnosis not present

## 2019-01-05 DIAGNOSIS — E1159 Type 2 diabetes mellitus with other circulatory complications: Secondary | ICD-10-CM | POA: Diagnosis not present

## 2019-01-05 NOTE — Patient Instructions (Addendum)
Please continue the same medications.  Please continue your weight loss efforts.   check your blood sugar once a day.  vary the time of day when you check, between before the 3 meals, and at bedtime.  also check if you have symptoms of your blood sugar being too high or too low.  please keep a record of the readings and bring it to your next appointment here (or you can bring the meter itself).  You can write it on any piece of paper.  please call us sooner if your blood sugar goes below 70, or if you have a lot of readings over 200. Please come back for a follow-up appointment in 3 months.   

## 2019-01-05 NOTE — Progress Notes (Signed)
Subjective:    Patient ID: Grant Williams, male    DOB: February 23, 1956, 63 y.o.   MRN: [id number]  HPI telehealth visit today via doxy video visit.  Alternatives to telehealth are presented to this patient, and the patient agrees to the telehealth visit. She is advised of the cost of the visit, and she agrees to this, also.   Patient is at home, and I am at the office.   Pt returns for f/u of diabetes mellitus:  DM type: 2. Dx'ed: 2008.   Complications: polyneuropathy, nephropathy, and CAD.   Therapy: trulicity and 2 oral meds.     DKA: never.  Severe hypoglycemia: never.   Pancreatitis: never.    Other: He took insulin 2015-2018; he had gastric bypass surgery in 2018; he did not tolerate farxiga (polyuria).   Interval history: pt says cbg varies from 90-160.  Pt says he has lost a few more lbs.  Denies n/v.   Past Medical History:  Diagnosis Date  . Coronary artery disease   . Cough syncope   . Diabetes mellitus without complication ([place])   . Fibromyalgia   . HTN (hypertension)   . Hyperlipidemia   . Hypertension   . Morbid obesity ([place])   . SOB (shortness of breath)   . Uncontrolled type 2 diabetes mellitus with complication ([place])   . Vitamin B12 deficiency     Past Surgical History:  Procedure Laterality Date  . ELBOW SURGERY Right   . HERNIA REPAIR    . SHOULDER ARTHROSCOPY Right     Social History   Socioeconomic History  . Marital status: Married    Spouse name: Not on file  . Number of children: Not on file  . Years of education: Not on file  . Highest education level: Not on file  Occupational History  . Not on file  Social Needs  . Financial resource strain: Not on file  . Food insecurity    Worry: Not on file    Inability: Not on file  . Transportation needs    Medical: Not on file    Non-medical: Not on file  Tobacco Use  . Smoking status: Former Research scientist (life sciences)  . Smokeless tobacco: Current User    Types: Chew  . Tobacco comment: 30 years ago   Substance and Sexual Activity  . Alcohol use: No  . Drug use: No  . Sexual activity: Not Currently  Lifestyle  . Physical activity    Days per week: Not on file    Minutes per session: Not on file  . Stress: Not on file  Relationships  . Social Herbalist on phone: Not on file    Gets together: Not on file    Attends religious service: Not on file    Active member of club or organization: Not on file    Attends meetings of clubs or organizations: Not on file    Relationship status: Not on file  . Intimate partner violence    Fear of current or ex partner: Not on file    Emotionally abused: Not on file    Physically abused: Not on file    Forced sexual activity: Not on file  Other Topics Concern  . Not on file  Social History Narrative  . Not on file    Current Outpatient Medications on File Prior to Visit  Medication Sig Dispense Refill  . acetaminophen (TYLENOL) 500 MG tablet Take 1 tablet by mouth every 6 (six)  hours as needed.    Marland Kitchen. amLODipine (NORVASC) 10 MG tablet TAKE 1 TABLET BY MOUTH DAILY. 90 tablet 0  . ARIPiprazole (ABILIFY) 5 MG tablet Take 5 mg by mouth daily.     . Calcium Citrate-Vitamin D 200-250 MG-UNIT TABS Take 1 tablet by mouth 2 (two) times daily.    . canagliflozin (INVOKANA) 100 MG TABS tablet Take 1 tablet (100 mg total) by mouth daily before breakfast. 90 tablet 3  . docusate sodium (COLACE) 100 MG capsule Take 100 mg by mouth 2 (two) times daily.    . Dulaglutide (TRULICITY) 1.5 MG/0.5ML SOPN Inject 1.5 mg into the skin once a week. 12 pen 3  . DULoxetine (CYMBALTA) 60 MG capsule Take 60 mg by mouth 2 (two) times daily.     . ergocalciferol (VITAMIN D2) 50000 units capsule Take 50,000 Units by mouth 2 (two) times a week.     . gabapentin (NEURONTIN) 400 MG capsule Take 800 mg by mouth 4 (four) times daily.     Marland Kitchen. glucose blood (ONETOUCH VERIO) test strip 1 each by Other route daily. use TO CHECK BLOOD SUGAR every day 50 each 11  . metFORMIN  (GLUCOPHAGE) 500 MG tablet TAKE 2 TABLETS BY MOUTH TWICE DAILY WITH MEALS 360 tablet 0  . methocarbamol (ROBAXIN) 500 MG tablet Take 500-1,000 mg by mouth See admin instructions. Take 1 tablet (500 mg) by mouth every morning, take 1 tablet 8 hours later and then 2 tablets 8 hours after 2nd dose    . metoprolol (LOPRESSOR) 100 MG tablet Take 100 mg by mouth daily.    . Multiple Vitamin (MULTIVITAMIN) tablet Take 1 tablet by mouth daily.    . pantoprazole (PROTONIX) 40 MG tablet Take 40 mg by mouth daily.     . pregabalin (LYRICA) 150 MG capsule Take 150 mg by mouth 3 (three) times daily.    Marland Kitchen. rOPINIRole (REQUIP) 1 MG tablet Take 1 mg by mouth at bedtime.     . traZODone (DESYREL) 50 MG tablet Take 50 mg by mouth at bedtime as needed for sleep.    . valsartan-hydrochlorothiazide (DIOVAN-HCT) 320-12.5 MG tablet Take 1 tablet by mouth daily.    . iron polysaccharides (NIFEREX) 150 MG capsule Take 1 capsule by mouth 2 (two) times daily.     No current facility-administered medications on file prior to visit.     Allergies  Allergen Reactions  . Penicillins Other (See Comments)    Unknown childhood allergic reaction - no other information available    Family History  Problem Relation Age of Onset  . Diabetes Father     Review of Systems He denies hypoglycemia.  He has slight ankle swelling.      Objective:   Physical Exam    outside test results are reviewed: A1c=6.2%    Assessment & Plan:  Type 2 DM, with CAD: well-controlled Edema: This limits rx options Obesity: good progress.  Pt asks that med regimen address this.  Patient Instructions  Please continue the same medications.   Please continue your weight loss efforts.   check your blood sugar once a day.  vary the time of day when you check, between before the 3 meals, and at bedtime.  also check if you have symptoms of your blood sugar being too high or too low.  please keep a record of the readings and bring it to your  next appointment here (or you can bring the meter itself).  You can write it on  any piece of paper.  please call us sooner if your blood sugar goes below 70, or if you have a lot of readings over 200. Please come back for a follow-up appointment in 3 months.

## 2019-01-08 LAB — NOVEL CORONAVIRUS, NAA: SARS-CoV-2, NAA: NOT DETECTED

## 2019-01-13 ENCOUNTER — Other Ambulatory Visit: Payer: Self-pay | Admitting: Cardiovascular Disease

## 2019-01-13 ENCOUNTER — Telehealth: Payer: Self-pay | Admitting: Endocrinology

## 2019-01-13 ENCOUNTER — Other Ambulatory Visit: Payer: Self-pay | Admitting: Endocrinology

## 2019-01-13 ENCOUNTER — Telehealth: Payer: Self-pay

## 2019-01-13 MED ORDER — FARXIGA 5 MG PO TABS: 5.0000 mg | ORAL_TABLET | Freq: Every day | ORAL | 3 refills | Status: DC

## 2019-01-13 NOTE — Telephone Encounter (Signed)
Levada Dy from [facility] called states that "  she is the pharmacist working on Prior Authorization for this patient.  She states that she has faxed a request for additional clinical information for the prior authorization and that she will pend the request until end of day for response, but if information not received she will close due to lack of response."

## 2019-01-13 NOTE — Telephone Encounter (Signed)
PA initiated today through Cover My Meds for Invokana. Will await insurance response re: approval/denial.  Rebecca Eaton (Key: [id number]) Rx #: [id number] Invokana 100MG  tablets   Form [employer] [facility] Electronic PA Form Created 7 hours ago Sent to Plan less than a minute ago Plan Response less than a minute ago Submit Clinical Questions Expires 14 days from now Determination Clinical Questions Are Ready Fill out the questions below and click "Send to Plan." The plan requires answers to the clinical questions for this electronic prior authorization.

## 2019-01-13 NOTE — Telephone Encounter (Signed)
At Dr. Cordelia Pen request, called pt to inform about below:  please contact patient: I got PA form: I have sent a prescription to your pharmacy, to change invokana to [place]. I'll see you next time.    LVM requesting returned call.

## 2019-01-13 NOTE — Telephone Encounter (Signed)
please contact patient: I got PA form: I have sent a prescription to your pharmacy, to change invokana to farxiga. I'll see you next time.

## 2019-01-13 NOTE — Telephone Encounter (Signed)
Pt aware that the rx has been changed and sent to pharmacy. Nothing further needed.

## 2019-01-13 NOTE — Telephone Encounter (Signed)
At Dr. Cordelia Pen request, called pt to inform about new Rx. LVM requesting returned call.

## 2019-01-13 NOTE — Telephone Encounter (Signed)
UPDATE:  Rebecca Eaton (Key: [id number])  [employer] is reviewing your PA request. You may close this dialog, return to your dashboard, and perform other tasks.  To check for an update later, refresh this page, or open this request again from your dashboard. To follow up on this request after 72 hours, please contact Lindsay directly at [phone].

## 2019-01-13 NOTE — Telephone Encounter (Signed)
Document received and placed on Dr. Cordelia Pen desk for him to review, complete and sign.

## 2019-01-17 ENCOUNTER — Other Ambulatory Visit: Payer: Self-pay | Admitting: Endocrinology

## 2019-03-06 ENCOUNTER — Other Ambulatory Visit: Payer: Self-pay | Admitting: Urology

## 2019-03-09 NOTE — Progress Notes (Signed)
PCP - Vicenta Aly , FNP Cardiologist - LOV dr. Gwenlyn Found 04-05-16 clearance for roux en y  Dr. Renato Shin endocrinology   PPM/ICD -  Device Orders -  Rep Notified  Chest x-ray -  EKG -  Stress Test -  ECHO -  Cardiac Cath -   Sleep Study -  CPAP -   Fasting Blood Sugar - 100-110 Checks Blood Sugar __1___ times a day  Blood Thinner Instructions: Aspirin Instructions:  ERAS Protcol - PRE-SURGERY Ensure -   COVID TEST- negative   Anesthesia review: platlet 124, OSA no cpap does not tolerate  Patient denies shortness of breath, fever, cough and chest pain at PAT appointment   NONE   Patient verbalized understanding of instructions that were given to them at the PAT appointment. Patient was also instructed that they will need to review over the PAT instructions again at home before surgery.

## 2019-03-09 NOTE — Patient Instructions (Signed)
DUE TO COVID-19 ONLY ONE VISITOR IS ALLOWED TO COME WITH YOU AND STAY IN THE WAITING ROOM ONLY DURING PRE OP AND PROCEDURE DAY OF SURGERY. THE 1 VISITOR MAY VISIT WITH YOU AFTER SURGERY IN YOUR PRIVATE ROOM DURING VISITING HOURS ONLY!  YOU NEED TO HAVE A COVID 19 TEST ON_______ @_______ , THIS TEST MUST BE DONE BEFORE SURGERY, COME  [address], [place] , [address].  [facility]) ONCE YOUR COVID TEST IS COMPLETED, PLEASE BEGIN THE QUARANTINE INSTRUCTIONS AS OUTLINED IN YOUR HANDOUT.                Grant Williams  03/09/2019   Your procedure is scheduled on: 03-14-19   Report to [facility] Main  Entrance   Report to  Short stay  at       0530 AM     Call this number if you have problems the morning of surgery [phone]    Remember: Do not eat food or drink liquids :After Midnight. BRUSH YOUR TEETH MORNING OF SURGERY AND RINSE YOUR MOUTH OUT, NO CHEWING GUM CANDY OR MINTS.     Take these medicines the morning of surgery with A SIP OF WATER: flomax, lyrica, protonix, metoprolol, gabapentin, cymbalta, abilify, amlodipine,   Hold FARXIGA DAY BEFORE SURGERY  DO NOT TAKE ANY DIABETIC MEDICATIONS DAY OF YOUR SURGERY                               You may not have any metal on your body including hair pins and              piercings  Do not wear jewelry, make-up, lotions, powders or perfumes, deodorant                         Men may shave face and neck.   Do not bring valuables to the hospital. [facility] IS NOT             RESPONSIBLE   FOR VALUABLES.  Contacts, dentures or bridgework may not be worn into surgery.                Please read over the following fact sheets you were given: _____________________________________________________________________           [facility] - Preparing for Surgery Before surgery, you can play an important role.  Because skin is not sterile, your skin needs to be as free of germs as possible.  You can reduce the  number of germs on your skin by washing with CHG (chlorahexidine gluconate) soap before surgery.  CHG is an antiseptic cleaner which kills germs and bonds with the skin to continue killing germs even after washing. Please DO NOT use if you have an allergy to CHG or antibacterial soaps.  If your skin becomes reddened/irritated stop using the CHG and inform your nurse when you arrive at Short Stay. Do not shave (including legs and underarms) for at least 48 hours prior to the first CHG shower.  You may shave your face/neck. Please follow these instructions carefully:  1.  Shower with CHG Soap the night before surgery and the  morning of Surgery.  2.  If you choose to wash your hair, wash your hair first as usual with your  normal  shampoo.  3.  After you shampoo, rinse your hair and body thoroughly to remove the  shampoo.                           4.  Use CHG as you would any other liquid soap.  You can apply chg directly  to the skin and wash                       Gently with a scrungie or clean washcloth.  5.  Apply the CHG Soap to your body ONLY FROM THE NECK DOWN.   Do not use on face/ open                           Wound or open sores. Avoid contact with eyes, ears mouth and genitals (private parts).                       Wash face,  Genitals (private parts) with your normal soap.             6.  Wash thoroughly, paying special attention to the area where your surgery  will be performed.  7.  Thoroughly rinse your body with warm water from the neck down.  8.  DO NOT shower/wash with your normal soap after using and rinsing off  the CHG Soap.                9.  Pat yourself dry with a clean towel.            10.  Wear clean pajamas.            11.  Place clean sheets on your bed the night of your first shower and do not  sleep with pets. Day of Surgery : Do not apply any lotions/deodorants the morning of surgery.  Please wear clean clothes to the hospital/surgery center.  FAILURE TO FOLLOW  THESE INSTRUCTIONS MAY RESULT IN THE CANCELLATION OF YOUR SURGERY PATIENT SIGNATURE_________________________________  NURSE SIGNATURE__________________________________  ________________________________________________________________________ How to Manage Your Diabetes Before and After Surgery  Why is it important to control my blood sugar before and after surgery? . Improving blood sugar levels before and after surgery helps healing and can limit problems. . A way of improving blood sugar control is eating a healthy diet by: o  Eating less sugar and carbohydrates o  Increasing activity/exercise o  Talking with your doctor about reaching your blood sugar goals . High blood sugars (greater than 180 mg/dL) can raise your risk of infections and slow your recovery, so you will need to focus on controlling your diabetes during the weeks before surgery. . Make sure that the doctor who takes care of your diabetes knows about your planned surgery including the date and location.  How do I manage my blood sugar before surgery? . Check your blood sugar at least 4 times a day, starting 2 days before surgery, to make sure that the level is not too high or low. o Check your blood sugar the morning of your surgery when you wake up and every 2 hours until you get to the Short Stay unit. . If your blood sugar is less than 70 mg/dL, you will need to treat for low blood sugar: o Do not take insulin. o Treat a low blood sugar (less than 70 mg/dL) with  cup of clear juice (cranberry or apple), 4 glucose tablets, OR glucose gel. o Recheck blood sugar in  15 minutes after treatment (to make sure it is greater than 70 mg/dL). If your blood sugar is not greater than 70 mg/dL on recheck, call [phone] for further instructions. . Report your blood sugar to the short stay nurse when you get to Short Stay.  . If you are admitted to the hospital after surgery: o Your blood sugar will be checked by the staff and  you will probably be given insulin after surgery (instead of oral diabetes medicines) to make sure you have good blood sugar levels. o The goal for blood sugar control after surgery is 80-180 mg/dL.   WHAT DO I DO ABOUT MY DIABETES MEDICATION?  Marland Kitchen Do not take oral diabetes medicines (pills) the morning of surgery.

## 2019-03-10 ENCOUNTER — Other Ambulatory Visit (HOSPITAL_COMMUNITY)
Admission: RE | Admit: 2019-03-10 | Discharge: 2019-03-10 | Disposition: A | Discharge status: Home / Self Care | Payer: Commercial Managed Care - PPO | Source: Ambulatory Visit | Attending: Urology | Admitting: Urology

## 2019-03-10 ENCOUNTER — Other Ambulatory Visit: Payer: Self-pay

## 2019-03-10 ENCOUNTER — Encounter (HOSPITAL_COMMUNITY): Payer: Self-pay

## 2019-03-10 ENCOUNTER — Encounter (HOSPITAL_COMMUNITY)
Admission: RE | Admit: 2019-03-10 | Discharge: 2019-03-10 | Disposition: A | Discharge status: Home / Self Care | Payer: Commercial Managed Care - PPO | Source: Ambulatory Visit | Attending: Urology | Admitting: Urology

## 2019-03-10 DIAGNOSIS — N138 Other obstructive and reflux uropathy: Secondary | ICD-10-CM | POA: Diagnosis not present

## 2019-03-10 DIAGNOSIS — Z01818 Encounter for other preprocedural examination: Secondary | ICD-10-CM | POA: Diagnosis not present

## 2019-03-10 DIAGNOSIS — N401 Enlarged prostate with lower urinary tract symptoms: Secondary | ICD-10-CM | POA: Diagnosis not present

## 2019-03-10 DIAGNOSIS — Z20828 Contact with and (suspected) exposure to other viral communicable diseases: Secondary | ICD-10-CM | POA: Insufficient documentation

## 2019-03-10 DIAGNOSIS — R9431 Abnormal electrocardiogram [ECG] [EKG]: Secondary | ICD-10-CM | POA: Diagnosis not present

## 2019-03-10 HISTORY — DX: Gastro-esophageal reflux disease without esophagitis: K21.9

## 2019-03-10 HISTORY — DX: Personal history of urinary calculi: Z87.442

## 2019-03-10 HISTORY — DX: Anemia, unspecified: D64.9

## 2019-03-10 HISTORY — DX: Pneumonia, unspecified organism: J18.9

## 2019-03-10 HISTORY — DX: Sleep apnea, unspecified: G47.30

## 2019-03-10 LAB — HEMOGLOBIN A1C
Hgb A1c MFr Bld: 7.1 % — ABNORMAL HIGH (ref 4.8–5.6)
Mean Plasma Glucose: 157.07 mg/dL

## 2019-03-10 LAB — CBC
HCT: 52 % (ref 39.0–52.0)
Hemoglobin: 16.8 g/dL (ref 13.0–17.0)
MCH: 28.5 pg (ref 26.0–34.0)
MCHC: 32.3 g/dL (ref 30.0–36.0)
MCV: 88.3 fL (ref 80.0–100.0)
Platelets: 124 10*3/uL — ABNORMAL LOW (ref 150–400)
RBC: 5.89 MIL/uL — ABNORMAL HIGH (ref 4.22–5.81)
RDW: 16.7 % — ABNORMAL HIGH (ref 11.5–15.5)
WBC: 7.6 10*3/uL (ref 4.0–10.5)
nRBC: 0 % (ref 0.0–0.2)

## 2019-03-10 LAB — GLUCOSE, CAPILLARY: Glucose-Capillary: 298 mg/dL — ABNORMAL HIGH (ref 70–99)

## 2019-03-10 LAB — BASIC METABOLIC PANEL
Anion gap: 6 (ref 5–15)
BUN: 14 mg/dL (ref 8–23)
CO2: 23 mmol/L (ref 22–32)
Calcium: 8.8 mg/dL — ABNORMAL LOW (ref 8.9–10.3)
Chloride: 110 mmol/L (ref 98–111)
Creatinine, Ser: 0.9 mg/dL (ref 0.61–1.24)
GFR calc Af Amer: >60 mL/min (ref >60)
GFR calc non Af Amer: >60 mL/min (ref >60)
Glucose, Bld: 248 mg/dL — ABNORMAL HIGH (ref 70–99)
Potassium: 4 mmol/L (ref 3.5–5.1)
Sodium: 139 mmol/L (ref 135–145)

## 2019-03-11 LAB — NOVEL CORONAVIRUS, NAA (HOSP ORDER, SEND-OUT TO REF LAB; TAT 18-24 HRS): SARS-CoV-2, NAA: NOT DETECTED

## 2019-03-13 ENCOUNTER — Other Ambulatory Visit: Payer: Self-pay

## 2019-03-13 DIAGNOSIS — E119 Type 2 diabetes mellitus without complications: Secondary | ICD-10-CM

## 2019-03-13 MED ORDER — METFORMIN HCL 500 MG PO TABS: 1000.0000 mg | ORAL_TABLET | Freq: Two times a day (BID) | ORAL | 0 refills | Status: DC

## 2019-03-13 NOTE — Progress Notes (Signed)
Anesthesia Chart Review   Case: [id number] Date/Time: 03/14/19 0715   Procedure: TRANSURETHRAL RESECTION OF THE PROSTATE (TURP) (N/A )   Anesthesia type: General   Pre-op diagnosis: BENIGN PROSTATE HYPERPLASIA WITH BLADDER OUTLET OBSTRUCTION   Location: [place] / [facility] ORS   Surgeon: Irine Seal, MD      DISCUSSION:63 y.o. former smoker with h/o HTN, DM II, HTN, CAD, HLD, sleep apnea w/o device, GERD, iron deficiency s/p iron infusion 01/30/2019, BHP with bladder outlet obstruction scheduled for above procedure 03/14/2019 with Dr. Irine Seal.   Pt last seen by cardiologist, Dr. Quay Burow.  Stable at this visit.  Cardiac cath 2012 with diagonal branch disease, treated medically. Low risk stress test 01/2016.    Anticipate pt can proceed with planned procedure barring acute status change.    VS: BP 133/75   Pulse 91   Temp 37.5 C (Oral)   Resp 18   Ht 5\' 8"  (1.727 m)   Wt 106.3 kg   SpO2 96%   BMI 35.62 kg/m   PROVIDERS: Vicenta Aly, FNP is PCP last seen 12/22/2018, stable   Quay Burow, MD is Cardiologist   Renato Shin, MD is Endocrinologist  LABS: Labs reviewed: Acceptable for surgery. (all labs ordered are listed, but only abnormal results are displayed)  Labs Reviewed  GLUCOSE, CAPILLARY - Abnormal; Notable for the following components:      Result Value   Glucose-Capillary 298 (*)    All other components within normal limits  BASIC METABOLIC PANEL - Abnormal; Notable for the following components:   Glucose, Bld 248 (*)    Calcium 8.8 (*)    All other components within normal limits  CBC - Abnormal; Notable for the following components:   RBC 5.89 (*)    RDW 16.7 (*)    Platelets 124 (*)    All other components within normal limits  HEMOGLOBIN A1C - Abnormal; Notable for the following components:   Hgb A1c MFr Bld 7.1 (*)    All other components within normal limits     IMAGES:   EKG: 03/10/2019 Rate 91 bpm Normal sinus rhythm  Left axis  deviation  Inferior infarct, age undetermined Anterior infarct, age undetermined  Abnormal ECG No significant change since last tracing   CV: Myocardial Perfusion 02/09/2016 IMPRESSION: 1. Inferolateral scar without evidence of ischemia.  2. Mild inferior and septal hypokinesis.  3. Left ventricular ejection fraction 54%  4. Non invasive risk stratification*: Low  Echo 02/09/16 Study Conclusions  - Left ventricle: The cavity size was normal. Wall thickness was   increased in a pattern of mild LVH. Systolic function was normal.   The estimated ejection fraction was in the range of 55% to 60%.   Although no diagnostic regional wall motion abnormality was   identified, this possibility cannot be completely excluded on the   basis of this study. Doppler parameters are consistent with   abnormal left ventricular relaxation (grade 1 diastolic   dysfunction). - Aortic valve: Mildly calcified annulus. Trileaflet. - Mitral valve: Calcified annulus. There was trivial regurgitation. - Right ventricle: The cavity size was mildly dilated. Systolic   function was normal. - Tricuspid valve: There was trivial regurgitation. - Pulmonary arteries: Systolic pressure could not be accurately   estimated. - Pericardium, extracardiac: There was no pericardial effusion.  Past Medical History:  Diagnosis Date  . Anemia    iron infusions  . Coronary artery disease   . Cough syncope   . Diabetes mellitus  without complication ([facility])   . Fibromyalgia   . GERD (gastroesophageal reflux disease)   . History of kidney stones   . HTN (hypertension)   . Hyperlipidemia   . Hypertension   . Morbid obesity ([facility])   . Pneumonia   . Sleep apnea    dont tolerate cpap  . SOB (shortness of breath)   . Uncontrolled type 2 diabetes mellitus with complication ([facility])   . Vitamin B12 deficiency     Past Surgical History:  Procedure Laterality Date  . ELBOW SURGERY Right   . HERNIA REPAIR     left and  right inguinal , umbilical also  . ROUX-EN-Y GASTRIC BYPASS     08-2016  something nicked and pt. had to have 3 units of blood  . SHOULDER ARTHROSCOPY Right     MEDICATIONS: . amLODipine (NORVASC) 10 MG tablet  . ARIPiprazole (ABILIFY) 5 MG tablet  . CALCIUM CITRATE-VITAMIN D PO  . cholecalciferol (VITAMIN D3) 25 MCG (1000 UT) tablet  . clotrimazole-betamethasone (LOTRISONE) cream  . dapagliflozin propanediol (FARXIGA) 5 MG TABS tablet  . docusate sodium (COLACE) 250 MG capsule  . Dulaglutide (TRULICITY) 1.5 MG/0.5ML SOPN  . DULoxetine (CYMBALTA) 30 MG capsule  . ergocalciferol (VITAMIN D2) 50000 units capsule  . gabapentin (NEURONTIN) 800 MG tablet  . glucose blood (ONETOUCH VERIO) test strip  . ibuprofen (ADVIL) 200 MG tablet  . iron polysaccharides (NIFEREX) 150 MG capsule  . metFORMIN (GLUCOPHAGE) 500 MG tablet  . metoprolol (LOPRESSOR) 100 MG tablet  . mupirocin ointment (BACTROBAN) 2 %  . omeprazole (PRILOSEC) 20 MG capsule  . oxybutynin (DITROPAN-XL) 10 MG 24 hr tablet  . pantoprazole (PROTONIX) 40 MG tablet  . Pediatric Multiple Vit-C-FA (CHILDRENS CHEWABLE VITAMINS PO)  . pregabalin (LYRICA) 300 MG capsule  . rOPINIRole (REQUIP) 1 MG tablet  . rosuvastatin (CRESTOR) 10 MG tablet  . tamsulosin (FLOMAX) 0.4 MG CAPS capsule  . traZODone (DESYREL) 50 MG tablet  . valsartan-hydrochlorothiazide (DIOVAN-HCT) 320-12.5 MG tablet   No current facility-administered medications for this encounter.    Janey Genta [facility] Pre-Surgical Testing [phone] 03/13/19  2:31 PM

## 2019-03-13 NOTE — Anesthesia Preprocedure Evaluation (Addendum)
Anesthesia Evaluation  Patient identified by MRN, date of birth, ID band Patient awake    Reviewed: Allergy & Precautions, NPO status , Patient's Chart, lab work & pertinent test results  Airway Mallampati: II  TM Distance: >3 FB Neck ROM: Full    Dental no notable dental hx.    Pulmonary sleep apnea , former smoker,    Pulmonary exam normal breath sounds clear to auscultation       Cardiovascular hypertension, Pt. on medications + CAD  Normal cardiovascular exam Rhythm:Regular Rate:Normal     Neuro/Psych negative neurological ROS  negative psych ROS   GI/Hepatic Neg liver ROS, GERD  ,  Endo/Other  negative endocrine ROSdiabetes, Type 2  Renal/GU negative Renal ROS  negative genitourinary   Musculoskeletal negative musculoskeletal ROS (+)   Abdominal (+) + obese,   Peds negative pediatric ROS (+)  Hematology negative hematology ROS (+)   Anesthesia Other Findings   Reproductive/Obstetrics negative OB ROS                            Anesthesia Physical Anesthesia Plan  ASA: III  Anesthesia Plan: General   Post-op Pain Management:    Induction: Intravenous  PONV Risk Score and Plan: 2 and Ondansetron, Midazolam and Treatment may vary due to age or medical condition  Airway Management Planned: LMA  Additional Equipment:   Intra-op Plan:   Post-operative Plan: Extubation in OR  Informed Consent: I have reviewed the patients History and Physical, chart, labs and discussed the procedure including the risks, benefits and alternatives for the proposed anesthesia with the patient or authorized representative who has indicated his/her understanding and acceptance.     Dental advisory given  Plan Discussed with: CRNA  Anesthesia Plan Comments: (See PAT note 03/10/2019, Konrad Felix, PA-C)       Anesthesia Quick Evaluation

## 2019-03-13 NOTE — H&P (Signed)
CC/HPI: Frequency, Nocturia and Urgency     03/01/19: Grant Williams returns today in f/u from [facility] for his 1 year history of refractory voiding symptoms with UUI and incomplete emptying. He has not had improvement in his symptoms and continues to have UUI and nocturia. despite tamsulosin and prior OAB meds. He has no associated signs or symptoms.   Grant Williams held a max capacity of approx. 326 mls. His 1st sensation was felt at 250 mls. There was positive instability. He felt an increased urge during his unstable contractions but was able to inhibit them without leaking. He was able to generate a voluntary contraction and void. His contraction was well sustained. He left a PVR of approx. 94 mls. No reflux was seen. He told me he often has to void every 45 minutes or so at night.   He had some narrowing of the prostatic urethra on fluoroscopy. His findings are equivocal for obstruction and a cystoscopy will be needed to complete the evaluation.   02/01/2019: Grant Williams returns today in follow-up. He was given myrbetriq 25 and 50 mg samples at last office visit, his anticholinergic was stopped. Tamsulosin continued.  Patient has tolerated the medication without side effect but has not noted any difference in daytime frequency/urgency and associated urge incontinence. He wears 1 pad per day and is moderately soaked when changed. Nocturia continues to be significantly bothersome with patient citing getting up every 60-90 minutes at night. Stream is weak but not having any problems starting his stream, maybe some mild hesitancy per his report. No interval fevers or chills, dysuria, gross hematuria. He does report receiving 2 iron infusions for anemia prior to today's appointment.   12/28/18: Grant Williams returns today in f/u. He had a couple of weeks ago last month when he was improved but over the last 2 weeks his symptoms have worsened with increased frequency, nocturia 4-5x and urgency. He has reduced stream and intermittency.  His PVR is 70ml. His UA today has 3+ glucose but his recent A1c was 6.2.   11/11/2018: Returns today for repeat assessment. Oxybutynin 10mg  ER added at last OV in an effort to better control his OAB symptoms (nocturia x5, urgency/UUI). He's only had minimal side effects to oxybutynin, mild dry mouth. He takes it in the morning and tamsulosin at night. He is noted some improvement with urgency and is having less urge incontinence. Nocturia reduced as well. His stream remains weak and intermittent. No increased straining. No interval dysuria, gross hematuria.   10/12/2018: Grant Williams returns today in f/u. He was given tamsulosin at his last visit for severe LUTS. He is better but still has nocturia x 5 and has urgency with UUI. His PVR is down to 46ml and his PF is 64ml/sec. He has no associated signs or symptoms.   GU Hx: Grant Williams is a 63 yo WM who presents with a 6 month history of frequency and nocturia q1hr. He has urgency with UUI. He has large volume incontinence. He will take a sleeping pill at time and can have enuresis when he does that. He has a weak stream. He will have intermittency. He generally feels empty. He has had no hematuria or dysuria. He has had 2 prior stones with the last in 2009. He had gastric bypass in 3/18 and had a foley for that. He had an anastomotic leak and had to have a reexploration for that and bleeding. He has had no flank pain. He is a diabetic and has 3+ glucose today  but his HgbA1c was in the 6 range recently. He has diabetic neuropathy.    ALLERGIES: Penicillins    MEDICATIONS: Metformin Hcl 500 mg tablet 1 tablet PO Daily  Metoprolol Tartrate 1 PO Daily  Tamsulosin Hcl 0.4 mg capsule 1 capsule PO Daily  Amlodipine Besylate 10 mg tablet 1 tablet PO Daily  Aripiprazole 5 mg tablet 1 tablet PO Daily  Calcium + D3 1 PO Daily  Colace 1 PO Daily  Duloxetine Hcl 1 PO Daily  Ergocalciferol 1 PO Daily  Gabapentin 1 PO Daily  Ibuprofen 1 PO Daily  Invokana 1 PO  Daily  Multivitamin 1 PO Daily  Pantoprazole Sodium 40 mg tablet, delayed release 1 tablet PO Daily  Pregabalin 300 mg capsule 1 capsule PO Daily  Ropinirole Hcl 1 mg tablet 1 tablet PO Daily  Trazodone Hcl 50 mg tablet 1 tablet PO Daily  Trulicity 0.75 mg/0.5 ml pen injector 1 PO Daily     GU PSH: Complex cystometrogram, w/ void pressure and urethral pressure profile studies, any technique - 02/06/2019 Complex Uroflow - 02/06/2019, 10/12/2018 Emg surf Electrd - 02/06/2019 Inject For cystogram - 02/06/2019 Intrabd voidng Press - 02/06/2019       PSH Notes: Ureter Procedures, Elbow Surgery, Hernia Repair, Shoulder Surgery, gastric bypass   NON-GU PSH: Hernia Repair - 2009 Laparoscope Proc; Ureter - 2009     GU PMH: Urge incontinence (Stable) - 10/12/2018 History of urolithiasis, He has had prior stones but has no flank pain or hematuria. There is a small chance he could have a distal ureteral stone causing his irritative symptoms and that may need to be assessed if other causes are not found. - 09/26/2018 Incomplete bladder emptying - 09/26/2018 BPH w/LUTS, Benign prostatic hyperplasia with urinary obstruction - 2014 Nocturia, Nocturia - 2014 Urinary Frequency, Increased urinary frequency - 2014      PMH Notes:  1898-06-22 00:00:00 - Note: Normal Routine History And Physical Adult  2007-07-06 10:35:32 - Note: Urinary Calculus   NON-GU PMH: Glycosuria, He has 3+ glucose on Invokana but his symptoms pre-date the med. - 09/26/2018 Personal history of other diseases of the digestive system, History of esophageal reflux - 2014 Personal history of other diseases of the nervous system and sense organs, History of migraine headaches - 2014 Asthma Diabetes Type 2 Hypercholesterolemia Hypertension    FAMILY HISTORY: 1 Daughter - Daughter 1 son - Son Diabetes - Father liver cancer - Father Lung Cancer - Father    Notes: Mother still living at age 69  Father deceased at age 66 from lung  cancer   SOCIAL HISTORY: Marital Status: Married Preferred Language: English Current Smoking Status: Patient does not smoke anymore. Has not smoked since 09/20/1988. Smoked for 10 years. Smoked 1 pack per day.   Tobacco Use Assessment Completed: Used Tobacco in last 30 days? Does not drink caffeine.     Notes: Caffeine Use, Marital History - Currently Married, Occupation:, Tobacco Use   REVIEW OF SYSTEMS:    GU Review Male:   Patient denies erection problems, leakage of urine, get up at night to urinate, penile pain, hard to postpone urination, stream starts and stops, burning/ pain with urination, trouble starting your stream, frequent urination, and have to strain to urinate .  Gastrointestinal (Upper):   Patient denies nausea, vomiting, and indigestion/ heartburn.  Gastrointestinal (Lower):   Patient denies diarrhea and constipation.  Constitutional:   Patient denies fever, night sweats, weight loss, and fatigue.  Skin:   Patient denies skin  rash/ lesion and itching.  Eyes:   Patient denies blurred vision and double vision.  Ears/ Nose/ Throat:   Patient denies sore throat and sinus problems.  Hematologic/Lymphatic:   Patient denies swollen glands and easy bruising.  Cardiovascular:   Patient denies leg swelling and chest pains.  Respiratory:   Patient denies cough and shortness of breath.  Endocrine:   Patient denies excessive thirst.  Musculoskeletal:   Patient denies back pain and joint pain.  Neurological:   Patient denies headaches and dizziness.  Psychologic:   Patient denies depression and anxiety.   VITAL SIGNS:      03/01/2019 04:14 PM  BP 132/81 mmHg  Pulse 77 /min  Temperature 97.7 F / 36.5 C   MULTI-SYSTEM PHYSICAL EXAMINATION:    Constitutional: Obese. No physical deformities. Normally developed. Good grooming.   Respiratory: Normal breath sounds. No labored breathing, no use of accessory muscles.   Cardiovascular: Regular rate and rhythm. No murmur, no gallop.       PAST DATA REVIEWED:  Source Of History:  Patient  Urine Test Review:   Urinalysis  Urodynamics Review:   Review Urodynamics Tests  X-Ray Review: C.T. Pelvis: Reviewed Films. 2007. Prostate measures 57ml    PROCEDURES:         Flexible Cystoscopy - 52000  Risks, benefits, and some of the potential complications of the procedure were discussed. 69ml of 2% lidocaine jelly was instilled intraurethrally.  Cipro 500mg  given for antibiotic prophylaxis.     Meatus:  Normal size. Normal location. Normal condition.  Urethra:  No strictures.  External Sphincter:  Normal.  Verumontanum:  Normal.  Prostate:  Obstructing. Moderate hyperplasia. 4cm  Bladder Neck:  Non-obstructing.  Ureteral Orifices:  Normal location. Normal size. Normal shape. Effluxed clear urine.  Bladder:  Mild trabeculation. No tumors. Normal mucosa. No stones.      The procedure was well tolerated and there were no complications.         Urinalysis Dipstick Dipstick Cont'd  Color: Yellow Bilirubin: Neg mg/dL  Appearance: Clear Ketones: Neg mg/dL  Specific Gravity: 1.020 Blood: Neg ery/uL  pH: <=5.0 Protein: Neg mg/dL  Glucose: 3+ mg/dL Urobilinogen: 0.2 mg/dL    Nitrites: Neg    Leukocyte Esterase: Neg leu/uL    ASSESSMENT:      ICD-10 Details  1 GU:   BPH w/LUTS - N40.1 Stable - He has BPH with BOO and some instability. I discussed his options including PTNS, Urolift, Rezum and TURP. I think TURP will be the most definitive option his condition and reviewed the risks in detail. I reviewd the risks of a TURP including bleeding, infection, incontinence, stricture, need for secondary procedures, ejaculatory and erectile dysfunction, thrombotic events, fluid overload and anesthetic complications. I explained that 95% of men will have relief of the obstructive symptoms and about 70% will have relief of the irritative symptoms. He is in agreement with that choice.   2   Incomplete bladder emptying - R39.14 Stable  3    Urge incontinence - N39.41 Stable   PLAN:           Schedule Return Visit/Planned Activity: Next Available Appointment - Schedule Surgery  Procedure: Unspecified Date - Cystoscopy TURP - 24268 Notes: Next available.

## 2019-03-14 ENCOUNTER — Observation Stay (HOSPITAL_COMMUNITY)
Admission: RE | Admit: 2019-03-14 | Discharge: 2019-03-15 | Disposition: A | Discharge status: Home / Self Care | Payer: Commercial Managed Care - PPO | Attending: Urology | Admitting: Urology

## 2019-03-14 ENCOUNTER — Ambulatory Visit (HOSPITAL_COMMUNITY): Payer: Commercial Managed Care - PPO | Admitting: Physician Assistant

## 2019-03-14 ENCOUNTER — Other Ambulatory Visit: Payer: Self-pay

## 2019-03-14 ENCOUNTER — Encounter (HOSPITAL_COMMUNITY)
Admission: RE | Disposition: A | Discharge status: Home / Self Care | Payer: Self-pay | Source: Home / Self Care | Attending: Urology

## 2019-03-14 ENCOUNTER — Encounter (HOSPITAL_COMMUNITY): Payer: Self-pay

## 2019-03-14 ENCOUNTER — Ambulatory Visit (HOSPITAL_COMMUNITY): Payer: Commercial Managed Care - PPO | Admitting: Certified Registered Nurse Anesthetist

## 2019-03-14 DIAGNOSIS — M797 Fibromyalgia: Secondary | ICD-10-CM | POA: Insufficient documentation

## 2019-03-14 DIAGNOSIS — N320 Bladder-neck obstruction: Secondary | ICD-10-CM | POA: Insufficient documentation

## 2019-03-14 DIAGNOSIS — G4730 Sleep apnea, unspecified: Secondary | ICD-10-CM | POA: Diagnosis not present

## 2019-03-14 DIAGNOSIS — E1140 Type 2 diabetes mellitus with diabetic neuropathy, unspecified: Secondary | ICD-10-CM | POA: Diagnosis not present

## 2019-03-14 DIAGNOSIS — K219 Gastro-esophageal reflux disease without esophagitis: Secondary | ICD-10-CM | POA: Diagnosis not present

## 2019-03-14 DIAGNOSIS — J45909 Unspecified asthma, uncomplicated: Secondary | ICD-10-CM | POA: Insufficient documentation

## 2019-03-14 DIAGNOSIS — I2510 Atherosclerotic heart disease of native coronary artery without angina pectoris: Secondary | ICD-10-CM | POA: Insufficient documentation

## 2019-03-14 DIAGNOSIS — I10 Essential (primary) hypertension: Secondary | ICD-10-CM | POA: Insufficient documentation

## 2019-03-14 DIAGNOSIS — E7800 Pure hypercholesterolemia, unspecified: Secondary | ICD-10-CM | POA: Insufficient documentation

## 2019-03-14 DIAGNOSIS — Z9884 Bariatric surgery status: Secondary | ICD-10-CM | POA: Insufficient documentation

## 2019-03-14 DIAGNOSIS — N3941 Urge incontinence: Secondary | ICD-10-CM | POA: Insufficient documentation

## 2019-03-14 DIAGNOSIS — Z7984 Long term (current) use of oral hypoglycemic drugs: Secondary | ICD-10-CM | POA: Insufficient documentation

## 2019-03-14 DIAGNOSIS — N138 Other obstructive and reflux uropathy: Secondary | ICD-10-CM | POA: Diagnosis present

## 2019-03-14 DIAGNOSIS — E785 Hyperlipidemia, unspecified: Secondary | ICD-10-CM | POA: Insufficient documentation

## 2019-03-14 DIAGNOSIS — Z79899 Other long term (current) drug therapy: Secondary | ICD-10-CM | POA: Diagnosis not present

## 2019-03-14 DIAGNOSIS — N401 Enlarged prostate with lower urinary tract symptoms: Secondary | ICD-10-CM | POA: Diagnosis present

## 2019-03-14 DIAGNOSIS — Z888 Allergy status to other drugs, medicaments and biological substances status: Secondary | ICD-10-CM | POA: Diagnosis not present

## 2019-03-14 DIAGNOSIS — Z880 Allergy status to penicillin: Secondary | ICD-10-CM | POA: Insufficient documentation

## 2019-03-14 DIAGNOSIS — R351 Nocturia: Secondary | ICD-10-CM | POA: Diagnosis not present

## 2019-03-14 HISTORY — PX: TRANSURETHRAL RESECTION OF PROSTATE: SHX73

## 2019-03-14 LAB — GLUCOSE, CAPILLARY
Glucose-Capillary: 140 mg/dL — ABNORMAL HIGH (ref 70–99)
Glucose-Capillary: 147 mg/dL — ABNORMAL HIGH (ref 70–99)
Glucose-Capillary: 134 mg/dL — ABNORMAL HIGH (ref 70–99)

## 2019-03-14 SURGERY — TURP (TRANSURETHRAL RESECTION OF PROSTATE)
Anesthesia: General

## 2019-03-14 MED ORDER — GABAPENTIN 400 MG PO CAPS
800.0000 mg | ORAL_CAPSULE | Freq: Four times a day (QID) | ORAL | Status: DC
  Administered 2019-03-14 – 2019-03-15 (×3): 800 mg via ORAL
  Filled 2019-03-14 (×3): qty 2

## 2019-03-14 MED ORDER — ONDANSETRON HCL 4 MG/2ML IJ SOLN: 4.0000 mg | INTRAMUSCULAR | Status: DC | PRN

## 2019-03-14 MED ORDER — ONDANSETRON HCL 4 MG/2ML IJ SOLN
INTRAMUSCULAR | Status: DC | PRN
  Administered 2019-03-14: 4 mg via INTRAVENOUS

## 2019-03-14 MED ORDER — CIPROFLOXACIN IN D5W 400 MG/200ML IV SOLN
INTRAVENOUS | Status: AC
  Filled 2019-03-14: qty 200

## 2019-03-14 MED ORDER — ONDANSETRON HCL 4 MG/2ML IJ SOLN
INTRAMUSCULAR | Status: AC
  Filled 2019-03-14: qty 2

## 2019-03-14 MED ORDER — OXYBUTYNIN CHLORIDE 5 MG PO TABS: 5.0000 mg | ORAL_TABLET | Freq: Three times a day (TID) | ORAL | Status: DC | PRN

## 2019-03-14 MED ORDER — HYDROCHLOROTHIAZIDE 12.5 MG PO CAPS
12.5000 mg | ORAL_CAPSULE | Freq: Every day | ORAL | Status: DC
  Administered 2019-03-15: 12.5 mg via ORAL
  Filled 2019-03-14: qty 1

## 2019-03-14 MED ORDER — DULOXETINE HCL 30 MG PO CPEP
30.0000 mg | ORAL_CAPSULE | Freq: Three times a day (TID) | ORAL | Status: DC
  Administered 2019-03-14 – 2019-03-15 (×3): 30 mg via ORAL
  Filled 2019-03-14 (×3): qty 1

## 2019-03-14 MED ORDER — ARIPIPRAZOLE 5 MG PO TABS
5.0000 mg | ORAL_TABLET | Freq: Every day | ORAL | Status: DC
  Administered 2019-03-15: 10:00:00 5 mg via ORAL
  Filled 2019-03-14: qty 1

## 2019-03-14 MED ORDER — CIPROFLOXACIN IN D5W 400 MG/200ML IV SOLN
400.0000 mg | INTRAVENOUS | Status: AC
  Administered 2019-03-14: 400 mg via INTRAVENOUS

## 2019-03-14 MED ORDER — DOCUSATE SODIUM 100 MG PO CAPS
200.0000 mg | ORAL_CAPSULE | Freq: Every day | ORAL | Status: DC
  Administered 2019-03-14: 200 mg via ORAL
  Filled 2019-03-14: qty 2

## 2019-03-14 MED ORDER — SODIUM CHLORIDE 0.9 % IR SOLN
3000.0000 mL | Status: DC
  Administered 2019-03-14 (×3): 3000 mL

## 2019-03-14 MED ORDER — CHLORHEXIDINE GLUCONATE CLOTH 2 % EX PADS: 6.0000 | MEDICATED_PAD | Freq: Every day | CUTANEOUS | Status: DC

## 2019-03-14 MED ORDER — FLEET ENEMA 7-19 GM/118ML RE ENEM: 1.0000 | ENEMA | Freq: Once | RECTAL | Status: DC | PRN

## 2019-03-14 MED ORDER — DIPHENHYDRAMINE HCL 12.5 MG/5ML PO ELIX: 12.5000 mg | ORAL_SOLUTION | Freq: Four times a day (QID) | ORAL | Status: DC | PRN

## 2019-03-14 MED ORDER — LIDOCAINE 2% (20 MG/ML) 5 ML SYRINGE
INTRAMUSCULAR | Status: DC | PRN
  Administered 2019-03-14: 80 mg via INTRAVENOUS

## 2019-03-14 MED ORDER — METOPROLOL TARTRATE 50 MG PO TABS
50.0000 mg | ORAL_TABLET | Freq: Every day | ORAL | Status: DC
  Administered 2019-03-14: 50 mg via ORAL
  Filled 2019-03-14: qty 1

## 2019-03-14 MED ORDER — FENTANYL CITRATE (PF) 100 MCG/2ML IJ SOLN
INTRAMUSCULAR | Status: AC
  Filled 2019-03-14: qty 2

## 2019-03-14 MED ORDER — DOCUSATE SODIUM 250 MG PO CAPS: 250.0000 mg | ORAL_CAPSULE | ORAL | Status: DC

## 2019-03-14 MED ORDER — TRAZODONE HCL 50 MG PO TABS: 50.0000 mg | ORAL_TABLET | Freq: Every evening | ORAL | Status: DC | PRN

## 2019-03-14 MED ORDER — PROMETHAZINE HCL 25 MG/ML IJ SOLN: 6.2500 mg | INTRAMUSCULAR | Status: DC | PRN

## 2019-03-14 MED ORDER — ROPINIROLE HCL 1 MG PO TABS
1.0000 mg | ORAL_TABLET | Freq: Every day | ORAL | Status: DC
  Administered 2019-03-14: 21:00:00 2 mg via ORAL
  Filled 2019-03-14: qty 2

## 2019-03-14 MED ORDER — PROPOFOL 10 MG/ML IV BOLUS
INTRAVENOUS | Status: AC
  Filled 2019-03-14: qty 20

## 2019-03-14 MED ORDER — LIDOCAINE 2% (20 MG/ML) 5 ML SYRINGE
INTRAMUSCULAR | Status: AC
  Filled 2019-03-14: qty 5

## 2019-03-14 MED ORDER — PROPOFOL 10 MG/ML IV BOLUS
INTRAVENOUS | Status: DC | PRN
  Administered 2019-03-14: 150 mg via INTRAVENOUS

## 2019-03-14 MED ORDER — PREGABALIN 100 MG PO CAPS
300.0000 mg | ORAL_CAPSULE | Freq: Two times a day (BID) | ORAL | Status: DC
  Administered 2019-03-14 – 2019-03-15 (×2): 300 mg via ORAL
  Filled 2019-03-14 (×2): qty 3

## 2019-03-14 MED ORDER — MIDAZOLAM HCL 2 MG/2ML IJ SOLN
INTRAMUSCULAR | Status: AC
  Filled 2019-03-14: qty 2

## 2019-03-14 MED ORDER — OXYCODONE HCL 5 MG/5ML PO SOLN: 5.0000 mg | Freq: Once | ORAL | Status: DC | PRN

## 2019-03-14 MED ORDER — OXYCODONE HCL 5 MG PO TABS
5.0000 mg | ORAL_TABLET | ORAL | Status: DC | PRN
  Administered 2019-03-14: 5 mg via ORAL
  Filled 2019-03-14: qty 1

## 2019-03-14 MED ORDER — BISACODYL 10 MG RE SUPP: 10.0000 mg | Freq: Every day | RECTAL | Status: DC | PRN

## 2019-03-14 MED ORDER — MIDAZOLAM HCL 5 MG/5ML IJ SOLN
INTRAMUSCULAR | Status: DC | PRN
  Administered 2019-03-14: 2 mg via INTRAVENOUS

## 2019-03-14 MED ORDER — HYDROMORPHONE HCL 1 MG/ML IJ SOLN: 0.5000 mg | INTRAMUSCULAR | Status: DC | PRN

## 2019-03-14 MED ORDER — VALSARTAN-HYDROCHLOROTHIAZIDE 320-12.5 MG PO TABS: 1.0000 | ORAL_TABLET | Freq: Every day | ORAL | Status: DC

## 2019-03-14 MED ORDER — SODIUM CHLORIDE 0.9 % IR SOLN
Status: DC | PRN
  Administered 2019-03-14: 12000 mL via INTRAVESICAL

## 2019-03-14 MED ORDER — DIPHENHYDRAMINE HCL 50 MG/ML IJ SOLN: 12.5000 mg | Freq: Four times a day (QID) | INTRAMUSCULAR | Status: DC | PRN

## 2019-03-14 MED ORDER — ACETAMINOPHEN 325 MG PO TABS: 650.0000 mg | ORAL_TABLET | ORAL | Status: DC | PRN

## 2019-03-14 MED ORDER — MEPERIDINE HCL 50 MG/ML IJ SOLN: 6.2500 mg | INTRAMUSCULAR | Status: DC | PRN

## 2019-03-14 MED ORDER — OXYCODONE HCL 5 MG PO TABS: 5.0000 mg | ORAL_TABLET | Freq: Once | ORAL | Status: DC | PRN

## 2019-03-14 MED ORDER — DOCUSATE SODIUM 100 MG PO CAPS
400.0000 mg | ORAL_CAPSULE | Freq: Every day | ORAL | Status: DC
  Administered 2019-03-15: 10:00:00 400 mg via ORAL
  Filled 2019-03-14: qty 4

## 2019-03-14 MED ORDER — ROSUVASTATIN CALCIUM 10 MG PO TABS
10.0000 mg | ORAL_TABLET | ORAL | Status: DC
  Administered 2019-03-15: 10 mg via ORAL
  Filled 2019-03-14: qty 1

## 2019-03-14 MED ORDER — LACTATED RINGERS IV SOLN
INTRAVENOUS | Status: DC
  Administered 2019-03-14: 06:00:00 via INTRAVENOUS

## 2019-03-14 MED ORDER — HYDROMORPHONE HCL 1 MG/ML IJ SOLN: 0.2500 mg | INTRAMUSCULAR | Status: DC | PRN

## 2019-03-14 MED ORDER — IRBESARTAN 300 MG PO TABS
300.0000 mg | ORAL_TABLET | Freq: Every day | ORAL | Status: DC
  Administered 2019-03-15: 10:00:00 300 mg via ORAL
  Filled 2019-03-14: qty 1

## 2019-03-14 MED ORDER — SENNOSIDES-DOCUSATE SODIUM 8.6-50 MG PO TABS: 1.0000 | ORAL_TABLET | Freq: Every evening | ORAL | Status: DC | PRN

## 2019-03-14 MED ORDER — AMLODIPINE BESYLATE 10 MG PO TABS
10.0000 mg | ORAL_TABLET | Freq: Every day | ORAL | Status: DC
  Administered 2019-03-15: 10 mg via ORAL
  Filled 2019-03-14: qty 1

## 2019-03-14 MED ORDER — FENTANYL CITRATE (PF) 100 MCG/2ML IJ SOLN
INTRAMUSCULAR | Status: DC | PRN
  Administered 2019-03-14 (×4): 50 ug via INTRAVENOUS

## 2019-03-14 MED ORDER — INSULIN ASPART 100 UNIT/ML ~~LOC~~ SOLN
0.0000 [IU] | Freq: Three times a day (TID) | SUBCUTANEOUS | Status: DC
  Administered 2019-03-14: 2 [IU] via SUBCUTANEOUS
  Administered 2019-03-15: 08:00:00 3 [IU] via SUBCUTANEOUS

## 2019-03-14 MED ORDER — METOPROLOL TARTRATE 50 MG PO TABS
100.0000 mg | ORAL_TABLET | Freq: Every day | ORAL | Status: DC
  Administered 2019-03-15: 10:00:00 100 mg via ORAL
  Filled 2019-03-14: qty 2

## 2019-03-14 MED ORDER — PANTOPRAZOLE SODIUM 40 MG PO TBEC
40.0000 mg | DELAYED_RELEASE_TABLET | Freq: Every day | ORAL | Status: DC
  Filled 2019-03-14: qty 1

## 2019-03-14 MED ORDER — METFORMIN HCL 500 MG PO TABS
1000.0000 mg | ORAL_TABLET | Freq: Two times a day (BID) | ORAL | Status: DC
  Administered 2019-03-14 – 2019-03-15 (×2): 1000 mg via ORAL
  Filled 2019-03-14 (×2): qty 2

## 2019-03-14 MED ORDER — POTASSIUM CHLORIDE IN NACL 20-0.45 MEQ/L-% IV SOLN
INTRAVENOUS | Status: DC
  Administered 2019-03-14 – 2019-03-15 (×2): via INTRAVENOUS
  Filled 2019-03-14 (×2): qty 1000

## 2019-03-14 SURGICAL SUPPLY — 18 items
BAG URINE DRAINAGE (UROLOGICAL SUPPLIES) IMPLANT
BAG URO CATCHER STRL LF (MISCELLANEOUS) ×3 IMPLANT
CATH FOLEY 3WAY 30CC 22FR (CATHETERS) ×2 IMPLANT
COVER WAND RF STERILE (DRAPES) IMPLANT
ELECT REM PT RETURN 15FT ADLT (MISCELLANEOUS) ×3 IMPLANT
GLOVE SURG SS PI 8.0 STRL IVOR (GLOVE) IMPLANT
GOWN STRL REUS W/TWL XL LVL3 (GOWN DISPOSABLE) ×3 IMPLANT
HOLDER FOLEY CATH W/STRAP (MISCELLANEOUS) IMPLANT
KIT TURNOVER KIT A (KITS) IMPLANT
LOOP CUT BIPOLAR 24F LRG (ELECTROSURGICAL) ×2 IMPLANT
MANIFOLD NEPTUNE II (INSTRUMENTS) ×3 IMPLANT
PACK CYSTO (CUSTOM PROCEDURE TRAY) ×3 IMPLANT
SET ASPIRATION TUBING (TUBING) ×3 IMPLANT
SYR 30ML LL (SYRINGE) ×2 IMPLANT
SYRINGE IRR TOOMEY STRL 70CC (SYRINGE) ×2 IMPLANT
TUBING CONNECTING 10 (TUBING) ×2 IMPLANT
TUBING CONNECTING 10' (TUBING) ×1
TUBING UROLOGY SET (TUBING) ×3 IMPLANT

## 2019-03-14 NOTE — Anesthesia Postprocedure Evaluation (Signed)
Anesthesia Post Note  Patient: COLSTON PYLE  Procedure(s) Performed: TRANSURETHRAL RESECTION OF THE PROSTATE (TURP) (N/A )     Patient location during evaluation: PACU Anesthesia Type: General Level of consciousness: awake and alert Pain management: pain level controlled Vital Signs Assessment: post-procedure vital signs reviewed and stable Respiratory status: spontaneous breathing, nonlabored ventilation and respiratory function stable Cardiovascular status: blood pressure returned to baseline and stable Postop Assessment: no apparent nausea or vomiting Anesthetic complications: no    Last Vitals:  Vitals:   03/14/19 0915 03/14/19 1015  BP: 139/89 122/80  Pulse: 72 69  Resp: 17   Temp: 36.8 C 36.8 C  SpO2: 94% 93%    Last Pain:  Vitals:   03/14/19 1015  TempSrc:   PainSc: 0-No pain                 Lynda Rainwater

## 2019-03-14 NOTE — Transfer of Care (Signed)
Immediate Anesthesia Transfer of Care Note  Patient: Grant Williams  Procedure(s) Performed: TRANSURETHRAL RESECTION OF THE PROSTATE (TURP) (N/A )  Patient Location: PACU  Anesthesia Type:General  Level of Consciousness: awake, alert  and oriented  Airway & Oxygen Therapy: Patient Spontanous Breathing and Patient connected to face mask oxygen  Post-op Assessment: Report given to RN and Post -op Vital signs reviewed and stable  Post vital signs: Reviewed and stable  Last Vitals:  Vitals Value Taken Time  BP    Temp    Pulse    Resp    SpO2      Last Pain:  Vitals:   03/14/19 0616  TempSrc:   PainSc: 7       Patients Stated Pain Goal: 6 (24/09/73 5329)  Complications: No apparent anesthesia complications

## 2019-03-14 NOTE — Op Note (Signed)
Preoperative diagnosis: 1. Bladder outlet obstruction secondary to BPH  Postoperative diagnosis:  1. Bladder outlet obstruction secondary to BPH  Procedure:  1. Cystoscopy 2. Transurethral resection of the prostate  Surgeon: Irine Seal. M.D.  Anesthesia: general  Complications: None  EBL: 287ml  Specimens: 1. Prostate chips  Disposition of specimens: Pathology  Indication: Grant Williams is a patient with bladder outlet obstruction secondary to benign prostatic hyperplasia. After reviewing the management options for treatment, he elected to proceed with the above surgical procedure(s). We have discussed the potential benefits and risks of the procedure, side effects of the proposed treatment, the likelihood of the patient achieving the goals of the procedure, and any potential problems that might occur during the procedure or recuperation. Informed consent has been obtained.  Description of procedure:  The patient was taken to the operating room and general anesthesia was induced.  The patient was placed in the dorsal lithotomy position, prepped and draped in the usual sterile fashion, and preoperative antibiotics were administered. A preoperative time-out was performed.   Cystourethroscopy was performed.  The patient's urethra was examined and was normal/ demonstrated bilobar prostatic hypertrophy with 3-4cm length and no middle lobe but a high bladder neck.   The bladder was then systematically examined in its entirety. There was mild trabeculation with no evidence of any bladder tumors, stones, or other mucosal pathology.  The ureteral orifices were identified and marked so as to be avoided during the procedure.  The prostate adenoma was then resected utilizing loop cautery resection with the bipolar cutting loop.  The prostate adenoma from the bladder neck back to the verumontanum was resected beginning at the six o'clock position and then extended to include the right and  left lobes of the prostate and anterior prostate. Care was taken not to resect distal to the verumontanum.  At the completion of the procedure the bladder was evacuated free of chips and hemostasis was insured.  Final inspection revealed intact ureteral orifices, a widely patent TUR channel and an intact external sphincter.   Hemostasis was then achieved with the cautery and the bladder was emptied and reinspected with no significant bleeding noted at the end of the procedure.    A 61fr 3 way catheter was then placed into the bladder and placed on continuous bladder irrigation.  The patient appeared to tolerate the procedure well and without complications.  The patient was able to be awakened and transferred to the recovery unit in satisfactory condition.

## 2019-03-14 NOTE — Discharge Instructions (Signed)
Transurethral Resection of the Prostate, Care After °This sheet gives you information about how to care for yourself after your procedure. Your health care provider may also give you more specific instructions. If you have problems or questions, contact your health care provider. °What can I expect after the procedure? °After the procedure, it is common to have: °· Mild pain in your lower abdomen. °· Soreness or mild discomfort in your penis from having the catheter inserted during the procedure. °· A feeling of urgency when you need to urinate. °· A small amount of blood in your urine. You may notice some small blood clots in your urine. These are normal. °Follow these instructions at home: °Medicines °· Take over-the-counter and prescription medicines only as told by your health care provider. °· If you were prescribed an antibiotic medicine, take it as told by your health care provider. Do not stop taking the antibiotic even if you start to feel better. °· Ask your health care provider if the medicine prescribed to you: °? Requires you to avoid driving or using heavy machinery. °? Can cause constipation. You may need to take actions to prevent or treat constipation, such as: °§ Take over-the-counter or prescription medicines. °§ Eat foods that are high in fiber, such as fresh fruits and vegetables, whole grains, and beans. °§ Limit foods that are high in fat and processed sugars, such as fried or sweet foods. °· Do not drive for 24 hours if you were given a sedative during your procedure. °Activity ° °· Return to your normal activities as told by your health care provider. Ask your health care provider what activities are safe for you. °· Do not lift anything that is heavier than 10 lb (4.5 kg), or the limit that you are told, for 3 weeks after the procedure or until your health care provider says that it is safe. °· Avoid intense physical activity for as long as told by your health care provider. °· Avoid  sitting for a long time without moving. Get up and move around one or more times every few hours. This helps to prevent blood clots. You may increase your physical activity gradually as you start to feel better. °Lifestyle °· Do not drink alcohol for as long as told by your health care provider. This is especially important if you are taking prescription pain medicines. °· Do not engage in sexual activity until your health care provider says that you can do this. °General instructions ° °· Do not take baths, swim, or use a hot tub until your health care provider approves. °· Drink enough fluid to keep your urine pale yellow. °· Urinate as soon as you feel the need to. Do not try to hold your urine for long periods of time. °· If your health care provider approves, you may take a stool softener for 2-3 weeks to prevent you from straining to have a bowel movement. °· Wear compression stockings as told by your health care provider. These stockings help to prevent blood clots and reduce swelling in your legs. °· Keep all follow-up visits as told by your health care provider. This is important. °Contact a health care provider if you have: °· Difficulty urinating. °· A fever. °· Pain that gets worse or does not improve with medicine. °· Blood in your urine that does not go away after 1 week of resting and drinking more fluids. °· Swelling in your penis or testicles. °Get help right away if: °· You are unable   to urinate.  You are having more blood clots in your urine instead of fewer.  You have: ? Large blood clots. ? A lot of blood in your urine. ? Pain in your back or lower abdomen. ? Pain or swelling in your legs. ? Chills and you are shaking. ? Difficulty breathing or shortness of breath. Summary  After the procedure, it is common to have a small amount of blood in your urine.  Avoid heavy lifting and intense physical activity for as long as told by your health care provider.  Urinate as soon as you  feel the need to. Do not try to hold your urine for long periods of time.  Keep all follow-up visits as told by your health care provider. This is important.  Avoid aspirin or Ibuprofen for a week.   This information is not intended to replace advice given to you by your health care provider. Make sure you discuss any questions you have with your health care provider. Document Released: 06/08/2005 Document Revised: 09/28/2018 Document Reviewed: 03/09/2018 Elsevier Patient Education  2020 [employer].

## 2019-03-14 NOTE — Interval H&P Note (Signed)
History and Physical Interval Note:  03/14/2019 7:14 AM  Grant Williams  has presented today for surgery, with the diagnosis of BENIGN PROSTATE HYPERPLASIA WITH BLADDER OUTLET OBSTRUCTION.  The various methods of treatment have been discussed with the patient and family. After consideration of risks, benefits and other options for treatment, the patient has consented to  Procedure(s): TRANSURETHRAL RESECTION OF THE PROSTATE (TURP) (N/A) as a surgical intervention.  The patient's history has been reviewed, patient examined, no change in status, stable for surgery.  I have reviewed the patient's chart and labs.  Questions were answered to the patient's satisfaction.     Irine Seal

## 2019-03-14 NOTE — Anesthesia Procedure Notes (Signed)
Procedure Name: LMA Insertion Date/Time: 03/14/2019 7:35 AM Performed by: Maxwell Caul, CRNA Pre-anesthesia Checklist: Patient identified, Emergency Drugs available, Suction available, Patient being monitored and Timeout performed Patient Re-evaluated:Patient Re-evaluated prior to induction Oxygen Delivery Method: Circle system utilized Preoxygenation: Pre-oxygenation with 100% oxygen Induction Type: IV induction LMA: LMA inserted LMA Size: 4.0 Number of attempts: 1 Placement Confirmation: positive ETCO2 and breath sounds checked- equal and bilateral Tube secured with: Tape Dental Injury: Teeth and Oropharynx as per pre-operative assessment

## 2019-03-15 ENCOUNTER — Encounter (HOSPITAL_COMMUNITY): Payer: Self-pay | Admitting: Urology

## 2019-03-15 DIAGNOSIS — N401 Enlarged prostate with lower urinary tract symptoms: Secondary | ICD-10-CM | POA: Diagnosis not present

## 2019-03-15 LAB — HIV ANTIBODY (ROUTINE TESTING W REFLEX): HIV Screen 4th Generation wRfx: NONREACTIVE

## 2019-03-15 LAB — GLUCOSE, CAPILLARY: Glucose-Capillary: 152 mg/dL — ABNORMAL HIGH (ref 70–99)

## 2019-03-15 LAB — SURGICAL PATHOLOGY

## 2019-03-15 NOTE — Progress Notes (Signed)
Foley DCed this am. Pt has voided multiple times without trouble. String of bottles complete. Urine becoming lighter in color with each void.

## 2019-03-15 NOTE — Discharge Summary (Signed)
Physician Discharge Summary  Patient ID: Grant Williams MRN: [id number] DOB/AGE: Sep 07, 1955 63 y.o.  Admit date: 03/14/2019 Discharge date: 03/15/2019  Admission Diagnoses:  BPH with urinary obstruction  Discharge Diagnoses:  Principal Problem:   BPH with urinary obstruction   Past Medical History:  Diagnosis Date  . Anemia    iron infusions  . Coronary artery disease   . Cough syncope   . Diabetes mellitus without complication (HCC)   . Fibromyalgia   . GERD (gastroesophageal reflux disease)   . History of kidney stones   . HTN (hypertension)   . Hyperlipidemia   . Hypertension   . Morbid obesity (HCC)   . Pneumonia   . Sleep apnea    dont tolerate cpap  . SOB (shortness of breath)   . Uncontrolled type 2 diabetes mellitus with complication (HCC)   . Vitamin B12 deficiency     Surgeries: Procedure(s): TRANSURETHRAL RESECTION OF THE PROSTATE (TURP) on 03/14/2019   Consultants (if any):   Discharged Condition: Improved  Hospital Course: Grant Williams is an 63 y.o. male who was admitted 03/14/2019 with a diagnosis of BPH with urinary obstruction and went to the operating room on 03/14/2019 and underwent the above named procedures.  Urine is clear on POD #1.  Foley removed and patient d/c'd when voiding.    He was given perioperative antibiotics:  Anti-infectives (From admission, onward)   Start     Dose/Rate Route Frequency Ordered Stop   03/14/19 0541  ciprofloxacin (CIPRO) 400 MG/200ML IVPB    Note to Pharmacy: Montel Clock   : cabinet override      03/14/19 0541 03/14/19 0736   03/14/19 0535  ciprofloxacin (CIPRO) IVPB 400 mg     400 mg 200 mL/hr over 60 Minutes Intravenous 60 min pre-op 03/14/19 0535 03/14/19 0806    .  He was given sequential compression devices for DVT prophylaxis.  He benefited maximally from the hospital stay and there were no complications.    Recent vital signs:  Vitals:   03/15/19 0016 03/15/19 0403  BP: 134/82 (!)  131/91  Pulse: 61 66  Resp: 18 18  Temp: 98.2 F (36.8 C) 98 F (36.7 C)  SpO2: 98% 94%    Recent laboratory studies:  Lab Results  Component Value Date   HGB 16.8 03/10/2019   HGB 15.2 02/10/2016   HGB 15.9 02/08/2016   Lab Results  Component Value Date   WBC 7.6 03/10/2019   PLT 124 (L) 03/10/2019   Lab Results  Component Value Date   INR 1.1 ratio (H) 09/02/2009   Lab Results  Component Value Date   NA 139 03/10/2019   K 4.0 03/10/2019   CL 110 03/10/2019   CO2 23 03/10/2019   BUN 14 03/10/2019   CREATININE 0.90 03/10/2019   GLUCOSE 248 (H) 03/10/2019    Discharge Medications:   Allergies as of 03/15/2019      Reactions   Other    Steroids   Does not tolerate bad for stomach has to take med before and after if uses any steroid   Penicillins Other (See Comments)   Unknown childhood allergic reaction - no other information available      Medication List    TAKE these medications   amLODipine 10 MG tablet Commonly known as: NORVASC TAKE 1 TABLET BY MOUTH EVERY DAY   ARIPiprazole 5 MG tablet Commonly known as: ABILIFY Take 5 mg by mouth daily.   CALCIUM CITRATE-VITAMIN D PO  Take 1 tablet by mouth 2 (two) times daily. 600 + vit d3   CHILDRENS CHEWABLE VITAMINS PO Take 1 tablet by mouth 2 (two) times daily.   cholecalciferol 25 MCG (1000 UT) tablet Commonly known as: VITAMIN D3 Take 1,000 Units by mouth 2 (two) times daily.   clotrimazole-betamethasone cream Commonly known as: LOTRISONE Apply 1 application topically 2 (two) times daily.   docusate sodium 250 MG capsule Commonly known as: COLACE Take 250-500 mg by mouth See admin instructions. 500 mg in the morning, 250 mg in the evening   Dulaglutide 1.5 MG/0.5ML Sopn Commonly known as: Trulicity Inject 1.5 mg into the skin once a week.   DULoxetine 30 MG capsule Commonly known as: CYMBALTA Take 30 mg by mouth 3 (three) times daily.   ergocalciferol 1.25 MG (50000 UT) capsule Commonly  known as: VITAMIN D2 Take 50,000 Units by mouth 2 (two) times a week. Mon and Fri   Farxiga 5 MG Tabs tablet Generic drug: dapagliflozin propanediol Take 5 mg by mouth daily.   gabapentin 800 MG tablet Commonly known as: NEURONTIN Take 800 mg by mouth 4 (four) times daily.   glucose blood test strip Commonly known as: OneTouch Verio 1 each by Other route daily. use TO CHECK BLOOD SUGAR every day   ibuprofen 200 MG tablet Commonly known as: ADVIL Take 600 mg by mouth every 8 (eight) hours as needed (pain).   iron polysaccharides 150 MG capsule Commonly known as: NIFEREX Take 1 capsule by mouth 2 (two) times daily.   metFORMIN 500 MG tablet Commonly known as: GLUCOPHAGE Take 2 tablets (1,000 mg total) by mouth 2 (two) times daily with a meal.   metoprolol tartrate 100 MG tablet Commonly known as: LOPRESSOR Take 50-100 mg by mouth See admin instructions. 100 mg in the morning, 50 mg at bedtime   mupirocin ointment 2 % Commonly known as: BACTROBAN Apply 1 application topically 3 (three) times daily.   omeprazole 20 MG capsule Commonly known as: PRILOSEC Take 20 mg by mouth See admin instructions. Take 1 cap twice daily before taking steroids, and up to 2 weeks after *ONLY IF TAKING ANY STEROIDS   oxybutynin 10 MG 24 hr tablet Commonly known as: DITROPAN-XL Take 10 mg by mouth at bedtime.   pantoprazole 40 MG tablet Commonly known as: PROTONIX Take 40 mg by mouth daily.   pregabalin 300 MG capsule Commonly known as: LYRICA Take 300 mg by mouth 2 (two) times daily.   rOPINIRole 1 MG tablet Commonly known as: REQUIP Take 1-2 mg by mouth at bedtime.   rosuvastatin 10 MG tablet Commonly known as: CRESTOR Take 10 mg by mouth 3 (three) times a week.   traZODone 50 MG tablet Commonly known as: DESYREL Take 50 mg by mouth at bedtime as needed for sleep.   valsartan-hydrochlorothiazide 320-12.5 MG tablet Commonly known as: DIOVAN-HCT Take 1 tablet by mouth daily.        Diagnostic Studies: No results found.  Disposition: Discharge disposition: 01-Home or Self Care       Discharge Instructions    Discontinue IV   Complete by: As directed       Follow-up Information    [facility] On 03/28/2019.   Why: 3:30 Contact information: [place] [phone]           Signed: Irine Seal 03/15/2019, 6:52 AM

## 2019-04-09 ENCOUNTER — Other Ambulatory Visit: Payer: Self-pay | Admitting: Cardiovascular Disease

## 2019-04-10 ENCOUNTER — Other Ambulatory Visit: Payer: Self-pay

## 2019-04-10 DIAGNOSIS — E119 Type 2 diabetes mellitus without complications: Secondary | ICD-10-CM

## 2019-04-10 DIAGNOSIS — Z794 Long term (current) use of insulin: Secondary | ICD-10-CM

## 2019-04-10 MED ORDER — METFORMIN HCL 500 MG PO TABS: 1000.0000 mg | ORAL_TABLET | Freq: Two times a day (BID) | ORAL | 0 refills | Status: DC

## 2019-04-12 ENCOUNTER — Telehealth: Payer: Self-pay | Admitting: *Deleted

## 2019-04-12 NOTE — Telephone Encounter (Signed)
A message was left, re: his follow up visit. 

## 2019-04-27 ENCOUNTER — Other Ambulatory Visit: Payer: Self-pay | Admitting: Endocrinology

## 2019-04-27 NOTE — Telephone Encounter (Signed)
1.  Please schedule f/u appt 2.  Then please refill x 1, pending that appt.  

## 2019-04-27 NOTE — Telephone Encounter (Signed)
Per Dr. Ellison, unable to refill Trulicity without an appt. Routing this message to the front desk for scheduling purposes.  

## 2019-04-27 NOTE — Telephone Encounter (Signed)
Please advise 

## 2019-04-28 MED ORDER — TRULICITY 1.5 MG/0.5ML ~~LOC~~ SOAJ: 1.5000 mg | SUBCUTANEOUS | 0 refills | Status: DC

## 2019-04-28 NOTE — Telephone Encounter (Signed)
Patient is scheduled for appointment on 05/11/19 at 9:15 a.m.

## 2019-04-28 NOTE — Addendum Note (Signed)
Addended by: Cardell Peach I on: 04/28/2019 03:30 PM   Modules accepted: Orders

## 2019-04-28 NOTE — Telephone Encounter (Signed)
RX sent

## 2019-05-02 ENCOUNTER — Ambulatory Visit (INDEPENDENT_AMBULATORY_CARE_PROVIDER_SITE_OTHER): Payer: Commercial Managed Care - PPO | Admitting: General Practice

## 2019-05-02 ENCOUNTER — Other Ambulatory Visit: Payer: Self-pay

## 2019-05-02 ENCOUNTER — Encounter: Payer: Self-pay | Admitting: General Practice

## 2019-05-02 VITALS — BP 130/76 | HR 63 | Ht 68.0 in | Wt 233.6 lb

## 2019-05-02 DIAGNOSIS — I2510 Atherosclerotic heart disease of native coronary artery without angina pectoris: Secondary | ICD-10-CM

## 2019-05-02 DIAGNOSIS — I10 Essential (primary) hypertension: Secondary | ICD-10-CM

## 2019-05-02 DIAGNOSIS — E7800 Pure hypercholesterolemia, unspecified: Secondary | ICD-10-CM | POA: Diagnosis not present

## 2019-05-02 NOTE — Progress Notes (Signed)
Cardiology Clinic Note   Patient Name: Grant Williams Date of Encounter: 05/02/2019  Primary Care Provider:  Elizabeth Palau, FNP Primary Cardiologist:  Nanetta Batty, MD  Patient Profile    Grant Williams. 63 year old male presents today for follow-up of his coronary artery disease, hyperlipidemia, and hypertension.  Past Medical History    Past Medical History:  Diagnosis Date  . Anemia    iron infusions  . Coronary artery disease   . Cough syncope   . Diabetes mellitus without complication (HCC)   . Fibromyalgia   . GERD (gastroesophageal reflux disease)   . History of kidney stones   . HTN (hypertension)   . Hyperlipidemia   . Hypertension   . Morbid obesity (HCC)   . Pneumonia   . Sleep apnea    dont tolerate cpap  . SOB (shortness of breath)   . Uncontrolled type 2 diabetes mellitus with complication (HCC)   . Vitamin B12 deficiency    Past Surgical History:  Procedure Laterality Date  . ELBOW SURGERY Right   . HERNIA REPAIR     left and right inguinal , umbilical also  . ROUX-EN-Y GASTRIC BYPASS     08-2016  something nicked and pt. had to have 3 units of blood  . SHOULDER ARTHROSCOPY Right   . TRANSURETHRAL RESECTION OF PROSTATE N/A 03/14/2019   Procedure: TRANSURETHRAL RESECTION OF THE PROSTATE (TURP);  Surgeon: Bjorn Pippin, MD;  Location: [facility];  Service: Urology;  Laterality: N/A;    Allergies  Allergies  Allergen Reactions  . Other     Steroids   Does not tolerate bad for stomach has to take med before and after if uses any steroid   . Penicillins Other (See Comments)    Unknown childhood allergic reaction - no other information available    History of Present Illness    [name] has a past medical history of coronary artery disease (cardiac catheterization 2012 by Dr. Clifton James showed mild to moderate diagonal branch coronary artery disease medical management recommended), hyperlipidemia, hypertension, obstructive sleep apnea, and  obesity. He underwent Roux-en-Y gastric bypass surgery on 11/18/2016 and lost around 50 pounds.  An echocardiogram on 01/2016 showed LVEF of 55 to 60%, mild LVH, and grade 1 diastolic dysfunction.  He was last seen by Dr. Allyson Sabal on 09/21/2017.  During that time he was doing well.  He denied chest pain and shortness of breath.  His diabetic neuropathy limited mobility and ability to exercise.  He was instructed to work on improving his diet.  He presents to the clinic today and states he has been doing well since his last appointment.  He has however noticed trouble with urination. He underwent a TURP procedure and presents today asking about starting myrbetric. I have reviewed his medications with the clinic Pharm D and given him permission to start this medication. He will continue to monitor his BP at home and maintain a log. He continues to have trouble maintaining an exercise regimen d/t his neuropathy.   He denies chest pain, shortness of breath, lower extremity edema, fatigue, palpitations, melena, hematuria, hemoptysis, diaphoresis, weakness, presyncope, syncope, orthopnea, and PND.   Home Medications    Prior to Admission medications   Medication Sig Start Date End Date Taking? Authorizing Provider  amLODipine (NORVASC) 10 MG tablet Take 1 tablet (10 mg total) by mouth daily. Please schedule an appt for further refills 04/10/19   Runell Gess, MD  ARIPiprazole (ABILIFY) 5 MG tablet  Take 5 mg by mouth daily.  02/11/14   [provider]  CALCIUM CITRATE-VITAMIN D PO Take 1 tablet by mouth 2 (two) times daily. 600 + vit d3    [provider]  cholecalciferol (VITAMIN D3) 25 MCG (1000 UT) tablet Take 1,000 Units by mouth 2 (two) times daily.    [provider]  clotrimazole-betamethasone (LOTRISONE) cream Apply 1 application topically 2 (two) times daily.    [provider]  dapagliflozin propanediol (FARXIGA) 5 MG TABS tablet Take 5 mg by mouth daily.  01/13/19   Romero Belling, MD  docusate sodium (COLACE) 250 MG capsule Take 250-500 mg by mouth See admin instructions. 500 mg in the morning, 250 mg in the evening    [provider]  Dulaglutide (TRULICITY) 1.5 MG/0.5ML SOPN Inject 1.5 mg into the skin once a week. 04/28/19   Romero Belling, MD  DULoxetine (CYMBALTA) 30 MG capsule Take 30 mg by mouth 3 (three) times daily.  02/21/14   [provider]  ergocalciferol (VITAMIN D2) 50000 units capsule Take 50,000 Units by mouth 2 (two) times a week. Mon and Fri    [provider]  gabapentin (NEURONTIN) 800 MG tablet Take 800 mg by mouth 4 (four) times daily.     [provider]  glucose blood (ONETOUCH VERIO) test strip 1 each by Other route daily. use TO CHECK BLOOD SUGAR every day 06/27/18   Romero Belling, MD  ibuprofen (ADVIL) 200 MG tablet Take 600 mg by mouth every 8 (eight) hours as needed (pain).    [provider]  iron polysaccharides (NIFEREX) 150 MG capsule Take 1 capsule by mouth 2 (two) times daily. 08/31/17   [provider]  metFORMIN (GLUCOPHAGE) 500 MG tablet Take 2 tablets (1,000 mg total) by mouth 2 (two) times daily with a meal. MUST CALL TO SCHEDULE APPT. 30 DAY SUPPLY WILL BE PROVIDED 04/10/19   Romero Belling, MD  metoprolol (LOPRESSOR) 100 MG tablet Take 50-100 mg by mouth See admin instructions. 100 mg in the morning, 50 mg at bedtime    [provider]  mupirocin ointment (BACTROBAN) 2 % Apply 1 application topically 3 (three) times daily.    [provider]  omeprazole (PRILOSEC) 20 MG capsule Take 20 mg by mouth See admin instructions. Take 1 cap twice daily before taking steroids, and up to 2 weeks after *ONLY IF TAKING ANY STEROIDS    [provider]  oxybutynin (DITROPAN-XL) 10 MG 24 hr tablet Take 10 mg by mouth at bedtime.    [provider]  pantoprazole (PROTONIX) 40 MG tablet Take 40 mg by mouth daily.  02/19/14   [provider]   Pediatric Multiple Vit-C-FA (CHILDRENS CHEWABLE VITAMINS PO) Take 1 tablet by mouth 2 (two) times daily.    [provider]  pregabalin (LYRICA) 300 MG capsule Take 300 mg by mouth 2 (two) times daily.     [provider]  rOPINIRole (REQUIP) 1 MG tablet Take 1-2 mg by mouth at bedtime.  03/03/14   [provider]  rosuvastatin (CRESTOR) 10 MG tablet Take 10 mg by mouth 3 (three) times a week.    [provider]  traZODone (DESYREL) 50 MG tablet Take 50 mg by mouth at bedtime as needed for sleep.    [provider]  valsartan-hydrochlorothiazide (DIOVAN-HCT) 320-12.5 MG tablet Take 1 tablet by mouth daily. 06/30/17   [provider]    Family History    Family History  Problem Relation Age of Onset  . Diabetes Father    He indicated that his mother is deceased. He indicated that his father is deceased.  Social History    Social History   Socioeconomic History  . Marital status: Married    Spouse name: Not on file  . Number of children: Not on file  . Years of education: Not on file  . Highest education level: Not on file  Occupational History  . Not on file  Social Needs  . Financial resource strain: Not on file  . Food insecurity    Worry: Not on file    Inability: Not on file  . Transportation needs    Medical: Not on file    Non-medical: Not on file  Tobacco Use  . Smoking status: Former Research scientist (life sciences)  . Smokeless tobacco: Former Systems developer    Types: Chew  . Tobacco comment: 30 years ago  Substance and Sexual Activity  . Alcohol use: No  . Drug use: No  . Sexual activity: Not Currently  Lifestyle  . Physical activity    Days per week: Not on file    Minutes per session: Not on file  . Stress: Not on file  Relationships  . Social Herbalist on phone: Not on file    Gets together: Not on file    Attends religious service: Not on file    Active member of club or organization: Not on file    Attends meetings of  clubs or organizations: Not on file    Relationship status: Not on file  . Intimate partner violence    Fear of current or ex partner: Not on file    Emotionally abused: Not on file    Physically abused: Not on file    Forced sexual activity: Not on file  Other Topics Concern  . Not on file  Social History Narrative  . Not on file     Review of Systems    General:  No chills, fever, night sweats or weight changes.  Cardiovascular:  No chest pain, dyspnea on exertion, edema, orthopnea, palpitations, paroxysmal nocturnal dyspnea. Dermatological: No rash, lesions/masses Respiratory: No cough, dyspnea Urologic: No hematuria, dysuria Abdominal:   No nausea, vomiting, diarrhea, bright red blood per rectum, melena, or hematemesis Neurologic:  No visual changes, wkns, changes in mental status. All other systems reviewed and are otherwise negative except as noted above.  Physical Exam    VS:  BP 130/76   Pulse 63   Ht 5\' 8"  (1.727 m)   Wt 233 lb 9.6 oz (106 kg)   SpO2 93%   BMI 35.52 kg/m  , BMI Body mass index is 35.52 kg/m. GEN: Well nourished, well developed, in no acute distress. HEENT: normal. Neck: Supple, no JVD, carotid bruits, or masses. Cardiac: RRR, no murmurs, rubs, or gallops. No clubbing, cyanosis, edema.  Radials/DP/PT 2+ and equal bilaterally.  Respiratory:  Respirations regular and unlabored, clear to auscultation bilaterally. GI: Soft, nontender, nondistended, BS + x 4. MS: no deformity or atrophy. Skin: warm and dry, no rash. Neuro:  Strength and sensation are intact. Psych: Normal affect.  Accessory Clinical Findings    ECG personally reviewed by me today- None today.  EKG 03/10/2019 Normal sinus rhythm left axis deviation 91 bpm  Echocardiogram 02/09/2016  Study Conclusions  - Left ventricle: The cavity size was normal. Wall thickness was   increased in a pattern of mild LVH. Systolic function was normal.  The estimated ejection fraction was in  the range of 55% to 60%.   Although no diagnostic regional wall motion abnormality was   identified, this possibility cannot be completely excluded on the   basis of this study. Doppler parameters are consistent with   abnormal left ventricular relaxation (grade 1 diastolic   dysfunction). - Aortic valve: Mildly calcified annulus. Trileaflet. - Mitral valve: Calcified annulus. There was trivial regurgitation. - Right ventricle: The cavity size was mildly dilated. Systolic   function was normal. - Tricuspid valve: There was trivial regurgitation. - Pulmonary arteries: Systolic pressure could not be accurately   estimated. - Pericardium, extracardiac: There was no pericardial effusion.   Assessment & Plan   1.  Essential hypertension-blood pressure today 130/76.  Patient started Myrbetric 25 mg.  Patient's medications discussed with clinic Pharm.D. Continue Diovan 320-12.5 Continue metoprolol 100 mg in the morning and 50 mg in the evening Continue amlodipine 10 mg tablet daily Heart healthy low-sodium diet-salty 6 given  Increase physical activity as tolerated Keep blood pressure log and call clinic if increased in blood pressures noticed.  Coronary artery disease-no chest pain today.  Underwent cardiac catheterization in 2012 which showed moderate diagonal branch disease that was treated medically. Continue amlodipine 10 mg tablet daily Continue metoprolol tartrate  Continue rosuvastatin 10 mg 3 times per week  Hyperlipidemia-LDL 79 08/10/2017. Continue rosuvastatin 10 mg tablet 3 times per week Heart healthy low-sodium high-fiber diet Increase physical activity as tolerated Followed by PCP  Obesity-weight today 233 pounds up from 227 pounds on 03/15/2019 Continue weight loss Increase physical activity as tolerated  Disposition: Follow-up with Dr. Allyson SabalBerry in 6 months  Thomasene RippleJesse M. Vallarie Fei NP-C     [facility] [address] Office  [phone] Fax [phone]

## 2019-05-02 NOTE — Patient Instructions (Signed)
Medication Instructions:  Continue current medications  *If you need a refill on your cardiac medications before your next appointment, please call your pharmacy*  Lab Work: None Ordered  Testing/Procedures: None Ordered  Follow-Up: At [facility], you and your health needs are our priority.  As part of our continuing mission to provide you with exceptional heart care, we have created designated Provider Care Teams.  These Care Teams include your primary Cardiologist (physician) and Advanced Practice Providers (APPs -  Physician Assistants and Nurse Practitioners) who all work together to provide you with the care you need, when you need it.  Your next appointment:   6 months  The format for your next appointment:   In Person  Provider:   Jonathan Berry, MD   

## 2019-05-09 ENCOUNTER — Other Ambulatory Visit: Payer: Self-pay | Admitting: Endocrinology

## 2019-05-09 ENCOUNTER — Other Ambulatory Visit: Payer: Self-pay

## 2019-05-09 DIAGNOSIS — Z794 Long term (current) use of insulin: Secondary | ICD-10-CM

## 2019-05-09 DIAGNOSIS — E119 Type 2 diabetes mellitus without complications: Secondary | ICD-10-CM

## 2019-05-11 ENCOUNTER — Encounter: Payer: Self-pay | Admitting: Endocrinology

## 2019-05-11 ENCOUNTER — Ambulatory Visit (INDEPENDENT_AMBULATORY_CARE_PROVIDER_SITE_OTHER): Payer: Commercial Managed Care - PPO | Admitting: Endocrinology

## 2019-05-11 ENCOUNTER — Other Ambulatory Visit: Payer: Self-pay

## 2019-05-11 VITALS — BP 142/80 | HR 67 | Ht 68.0 in | Wt 233.2 lb

## 2019-05-11 DIAGNOSIS — Z794 Long term (current) use of insulin: Secondary | ICD-10-CM

## 2019-05-11 DIAGNOSIS — I2510 Atherosclerotic heart disease of native coronary artery without angina pectoris: Secondary | ICD-10-CM

## 2019-05-11 DIAGNOSIS — E119 Type 2 diabetes mellitus without complications: Secondary | ICD-10-CM | POA: Diagnosis not present

## 2019-05-11 LAB — POCT GLYCOSYLATED HEMOGLOBIN (HGB A1C): Hemoglobin A1C: 6.8 % — AB (ref 4.0–5.6)

## 2019-05-11 MED ORDER — TRULICITY 3 MG/0.5ML ~~LOC~~ SOAJ: 3.0000 mg | SUBCUTANEOUS | 11 refills | Status: DC

## 2019-05-11 NOTE — Patient Instructions (Addendum)
Your blood pressure is high today.  Please see your primary care provider soon, to have it rechecked I have sent a prescription to your pharmacy, to increase the Trulicty Please continue the same other diabetes medications.   check your blood sugar once a day.  vary the time of day when you check, between before the 3 meals, and at bedtime.  also check if you have symptoms of your blood sugar being too high or too low.  please keep a record of the readings and bring it to your next appointment here (or you can bring the meter itself).  You can write it on any piece of paper.  please call us sooner if your blood sugar goes below 70, or if you have a lot of readings over 200. Please come back for a follow-up appointment in 3 months.

## 2019-05-11 NOTE — Progress Notes (Signed)
Subjective:    Patient ID: Grant Williams, male    DOB: Apr 23, 1956, 63 y.o.   MRN: [id number]  HPI Pt returns for f/u of diabetes mellitus:  DM type: 2. Dx'ed: 2008.   Complications: polyneuropathy, nephropathy, and CAD.   Therapy: trulicity and 2 oral meds.     DKA: never.  Severe hypoglycemia: never.   Pancreatitis: never.    Other: He took insulin 2015-2018; he had gastric bypass surgery in 2018; he did not tolerate farxiga (polyuria).   Interval history: pt says cbg's are well-controlled.  Pt says he has lost a few more lbs.   Past Medical History:  Diagnosis Date   Anemia    iron infusions   Coronary artery disease    Cough syncope    Diabetes mellitus without complication (HCC)    Fibromyalgia    GERD (gastroesophageal reflux disease)    History of kidney stones    HTN (hypertension)    Hyperlipidemia    Hypertension    Morbid obesity (HCC)    Pneumonia    Sleep apnea    dont tolerate cpap   SOB (shortness of breath)    Uncontrolled type 2 diabetes mellitus with complication ([place])    Vitamin B12 deficiency     Past Surgical History:  Procedure Laterality Date   ELBOW SURGERY Right    HERNIA REPAIR     left and right inguinal , umbilical also   ROUX-EN-Y GASTRIC BYPASS     08-2016  something nicked and pt. had to have 3 units of blood   SHOULDER ARTHROSCOPY Right    TRANSURETHRAL RESECTION OF PROSTATE N/A 03/14/2019   Procedure: TRANSURETHRAL RESECTION OF THE PROSTATE (TURP);  Surgeon: Irine Seal, MD;  Location: [facility];  Service: Urology;  Laterality: N/A;    Social History   Socioeconomic History   Marital status: Married    Spouse name: Not on file   Number of children: Not on file   Years of education: Not on file   Highest education level: Not on file  Occupational History   Not on file  Social Needs   Financial resource strain: Not on file   Food insecurity    Worry: Not on file    Inability: Not on file    Transportation needs    Medical: Not on file    Non-medical: Not on file  Tobacco Use   Smoking status: Former Smoker   Smokeless tobacco: Former Systems developer    Types: Chew   Tobacco comment: 30 years ago  Substance and Sexual Activity   Alcohol use: No   Drug use: No   Sexual activity: Not Currently  Lifestyle   Physical activity    Days per week: Not on file    Minutes per session: Not on file   Stress: Not on file  Relationships   Social connections    Talks on phone: Not on file    Gets together: Not on file    Attends religious service: Not on file    Active member of club or organization: Not on file    Attends meetings of clubs or organizations: Not on file    Relationship status: Not on file   Intimate partner violence    Fear of current or ex partner: Not on file    Emotionally abused: Not on file    Physically abused: Not on file    Forced sexual activity: Not on file  Other Topics Concern   Not  on file  Social History Narrative   Not on file    Current Outpatient Medications on File Prior to Visit  Medication Sig Dispense Refill   amLODipine (NORVASC) 10 MG tablet Take 1 tablet (10 mg total) by mouth daily. Please schedule an appt for further refills 90 tablet 0   ARIPiprazole (ABILIFY) 5 MG tablet Take 5 mg by mouth daily.      CALCIUM CITRATE-VITAMIN D PO Take 1 tablet by mouth 2 (two) times daily. 600 + vit d3     cholecalciferol (VITAMIN D3) 25 MCG (1000 UT) tablet Take 1,000 Units by mouth 2 (two) times daily.     clotrimazole-betamethasone (LOTRISONE) cream Apply 1 application topically 2 (two) times daily.     dapagliflozin propanediol (FARXIGA) 5 MG TABS tablet Take 5 mg by mouth daily. 90 tablet 3   docusate sodium (COLACE) 250 MG capsule Take 250-500 mg by mouth See admin instructions. 500 mg in the morning, 250 mg in the evening     DULoxetine (CYMBALTA) 30 MG capsule Take 30 mg by mouth 3 (three) times daily.      ergocalciferol  (VITAMIN D2) 50000 units capsule Take 50,000 Units by mouth 2 (two) times a week. Mon and Fri     gabapentin (NEURONTIN) 800 MG tablet Take 800 mg by mouth 4 (four) times daily.      glucose blood (ONETOUCH VERIO) test strip 1 each by Other route daily. use TO CHECK BLOOD SUGAR every day 50 each 11   ibuprofen (ADVIL) 200 MG tablet Take 600 mg by mouth every 8 (eight) hours as needed (pain).     iron polysaccharides (NIFEREX) 150 MG capsule Take 1 capsule by mouth 2 (two) times daily.     metFORMIN (GLUCOPHAGE) 500 MG tablet TAKE 2 TABLETS(1000 MG) BY MOUTH TWICE DAILY WITH A MEAL 120 tablet 0   metoprolol (LOPRESSOR) 100 MG tablet Take 50-100 mg by mouth See admin instructions. 100 mg in the morning, 50 mg at bedtime     mupirocin ointment (BACTROBAN) 2 % Apply 1 application topically 3 (three) times daily.     omeprazole (PRILOSEC) 20 MG capsule Take 20 mg by mouth See admin instructions. Take 1 cap twice daily before taking steroids, and up to 2 weeks after *ONLY IF TAKING ANY STEROIDS     oxybutynin (DITROPAN-XL) 10 MG 24 hr tablet Take 10 mg by mouth at bedtime.     pantoprazole (PROTONIX) 40 MG tablet Take 40 mg by mouth daily.      Pediatric Multiple Vit-C-FA (CHILDRENS CHEWABLE VITAMINS PO) Take 1 tablet by mouth 2 (two) times daily.     pregabalin (LYRICA) 300 MG capsule Take 300 mg by mouth 2 (two) times daily.      rOPINIRole (REQUIP) 1 MG tablet Take 1-2 mg by mouth at bedtime.      rosuvastatin (CRESTOR) 10 MG tablet Take 10 mg by mouth 3 (three) times a week.     traZODone (DESYREL) 50 MG tablet Take 50 mg by mouth at bedtime as needed for sleep.     valsartan-hydrochlorothiazide (DIOVAN-HCT) 320-12.5 MG tablet Take 1 tablet by mouth daily.     No current facility-administered medications on file prior to visit.     Allergies  Allergen Reactions   Other     Steroids   Does not tolerate bad for stomach has to take med before and after if uses any steroid     Penicillins Other (See Comments)    Unknown  childhood allergic reaction - no other information available    Family History  Problem Relation Age of Onset   Diabetes Father     BP (!) 142/80 (BP Location: Right Arm, Patient Position: Sitting, Cuff Size: Large)    Pulse 67    Ht 5\' 8"  (1.727 m)    Wt 233 lb 3.2 oz (105.8 kg)    SpO2 97%    BMI 35.46 kg/m    Review of Systems Denies nausea    Objective:   Physical Exam VITAL SIGNS:  See vs page GENERAL: no distress Pulses: dorsalis pedis intact bilat.   MSK: no deformity of the feet CV: no leg edema Skin:  no ulcer on the feet.  normal color and temp on the feet. Neuro: sensation is intact to touch on the feet  Lab Results  Component Value Date   HGBA1C 6.8 (A) 05/11/2019   Lab Results  Component Value Date   CREATININE 0.90 03/10/2019   BUN 14 03/10/2019   NA 139 03/10/2019   K 4.0 03/10/2019   CL 110 03/10/2019   CO2 23 03/10/2019        Assessment & Plan:  HTN: is noted today Type 2 DM: he would benefit from increased rx, if it can be done with a regimen that avoids or minimizes hypoglycemia. Obesity: increasing Trulicity would help further  Patient Instructions  Your blood pressure is high today.  Please see your primary care provider soon, to have it rechecked I have sent a prescription to your pharmacy, to increase the Trulicty Please continue the same other diabetes medications.   check your blood sugar once a day.  vary the time of day when you check, between before the 3 meals, and at bedtime.  also check if you have symptoms of your blood sugar being too high or too low.  please keep a record of the readings and bring it to your next appointment here (or you can bring the meter itself).  You can write it on any piece of paper.  please call 03/12/2019 sooner if your blood sugar goes below 70, or if you have a lot of readings over 200. Please come back for a follow-up appointment in 3 months.

## 2019-06-06 ENCOUNTER — Other Ambulatory Visit: Payer: Self-pay | Admitting: Endocrinology

## 2019-06-06 DIAGNOSIS — Z794 Long term (current) use of insulin: Secondary | ICD-10-CM

## 2019-06-06 DIAGNOSIS — E119 Type 2 diabetes mellitus without complications: Secondary | ICD-10-CM

## 2019-07-04 ENCOUNTER — Other Ambulatory Visit: Payer: Self-pay

## 2019-07-04 ENCOUNTER — Other Ambulatory Visit: Payer: Self-pay | Admitting: Cardiovascular Disease

## 2019-07-04 DIAGNOSIS — E119 Type 2 diabetes mellitus without complications: Secondary | ICD-10-CM

## 2019-07-04 DIAGNOSIS — Z794 Long term (current) use of insulin: Secondary | ICD-10-CM

## 2019-07-04 MED ORDER — METFORMIN HCL 500 MG PO TABS: 1000.0000 mg | ORAL_TABLET | Freq: Two times a day (BID) | ORAL | 2 refills | Status: DC

## 2019-08-11 ENCOUNTER — Other Ambulatory Visit: Payer: Self-pay

## 2019-08-15 ENCOUNTER — Ambulatory Visit (INDEPENDENT_AMBULATORY_CARE_PROVIDER_SITE_OTHER): Payer: Commercial Managed Care - PPO | Admitting: Endocrinology

## 2019-08-15 ENCOUNTER — Encounter: Payer: Self-pay | Admitting: Endocrinology

## 2019-08-15 ENCOUNTER — Other Ambulatory Visit: Payer: Self-pay

## 2019-08-15 VITALS — BP 104/68 | HR 88 | Ht 68.0 in | Wt 235.8 lb

## 2019-08-15 DIAGNOSIS — Z794 Long term (current) use of insulin: Secondary | ICD-10-CM

## 2019-08-15 DIAGNOSIS — E119 Type 2 diabetes mellitus without complications: Secondary | ICD-10-CM | POA: Diagnosis not present

## 2019-08-15 LAB — POCT GLYCOSYLATED HEMOGLOBIN (HGB A1C): Hemoglobin A1C: 7.8 % — AB (ref 4.0–5.6)

## 2019-08-15 MED ORDER — TRULICITY 4.5 MG/0.5ML ~~LOC~~ SOAJ: 4.5000 mg | SUBCUTANEOUS | 11 refills | Status: DC

## 2019-08-15 NOTE — Progress Notes (Signed)
Subjective:    Patient ID: Grant Williams, male    DOB: 06-24-1955, 64 y.o.   MRN: [id number]  HPI Pt returns for f/u of diabetes mellitus:  DM type: 2. Dx'ed: 2008.   Complications: polyneuropathy, nephropathy, and CAD.   Therapy: trulicity and 2 oral meds.     DKA: never.  Severe hypoglycemia: never.   Pancreatitis: never.    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  Interval history: pt says cbg's are well-controlled.  Pt says he has lost a few more lbs.  Past Medical History:  Diagnosis Date  . Anemia    iron infusions  . Coronary artery disease   . Cough syncope   . Diabetes mellitus without complication ([place])   . Fibromyalgia   . GERD (gastroesophageal reflux disease)   . History of kidney stones   . HTN (hypertension)   . Hyperlipidemia   . Hypertension   . Morbid obesity ([place])   . Pneumonia   . Sleep apnea    dont tolerate cpap  . SOB (shortness of breath)   . Uncontrolled type 2 diabetes mellitus with complication ([place])   . Vitamin B12 deficiency     Past Surgical History:  Procedure Laterality Date  . ELBOW SURGERY Right   . HERNIA REPAIR     left and right inguinal , umbilical also  . ROUX-EN-Y GASTRIC BYPASS     08-2016  something nicked and pt. had to have 3 units of blood  . SHOULDER ARTHROSCOPY Right   . TRANSURETHRAL RESECTION OF PROSTATE N/A 03/14/2019   Procedure: TRANSURETHRAL RESECTION OF THE PROSTATE (TURP);  Surgeon: Irine Seal, MD;  Location: [facility];  Service: Urology;  Laterality: N/A;    Social History   Socioeconomic History  . Marital status: Married    Spouse name: Not on file  . Number of children: Not on file  . Years of education: Not on file  . Highest education level: Not on file  Occupational History  . Not on file  Tobacco Use  . Smoking status: Former Research scientist (life sciences)  . Smokeless tobacco: Former Systems developer    Types: Chew  . Tobacco  comment: 30 years ago  Substance and Sexual Activity  . Alcohol use: No  . Drug use: No  . Sexual activity: Not Currently  Other Topics Concern  . Not on file  Social History Narrative  . Not on file   Social Determinants of Health   Financial Resource Strain:   . Difficulty of Paying Living Expenses: Not on file  Food Insecurity:   . Worried About Charity fundraiser in the Last Year: Not on file  . Ran Out of Food in the Last Year: Not on file  Transportation Needs:   . Lack of Transportation (Medical): Not on file  . Lack of Transportation (Non-Medical): Not on file  Physical Activity:   . Days of Exercise per Week: Not on file  . Minutes of Exercise per Session: Not on file  Stress:   . Feeling of Stress : Not on file  Social Connections:   . Frequency of Communication with Friends and Family: Not on file  . Frequency of Social Gatherings with Friends and Family: Not on file  . Attends Religious Services: Not on file  . Active Member of Clubs or Organizations: Not on file  . Attends Archivist Meetings:  Not on file  . Marital Status: Not on file  Intimate Partner Violence:   . Fear of Current or Ex-Partner: Not on file  . Emotionally Abused: Not on file  . Physically Abused: Not on file  . Sexually Abused: Not on file    Current Outpatient Medications on File Prior to Visit  Medication Sig Dispense Refill  . amLODipine (NORVASC) 10 MG tablet Take 1 tablet (10 mg total) by mouth daily. 90 tablet 0  . ARIPiprazole (ABILIFY) 5 MG tablet Take 5 mg by mouth daily.     Marland Kitchen CALCIUM CITRATE-VITAMIN D PO Take 1 tablet by mouth 2 (two) times daily. 600 + vit d3    . cholecalciferol (VITAMIN D3) 25 MCG (1000 UT) tablet Take 1,000 Units by mouth 2 (two) times daily.    . clotrimazole-betamethasone (LOTRISONE) cream Apply 1 application topically 2 (two) times daily.    . dapagliflozin propanediol (FARXIGA) 5 MG TABS tablet Take 5 mg by mouth daily. 90 tablet 3  .  docusate sodium (COLACE) 250 MG capsule Take 250-500 mg by mouth See admin instructions. 500 mg in the morning, 250 mg in the evening    . DULoxetine (CYMBALTA) 30 MG capsule Take 30 mg by mouth 3 (three) times daily.     . ergocalciferol (VITAMIN D2) 50000 units capsule Take 50,000 Units by mouth 2 (two) times a week. Mon and Fri    . gabapentin (NEURONTIN) 800 MG tablet Take 800 mg by mouth 4 (four) times daily.     Marland Kitchen glucose blood (ONETOUCH VERIO) test strip 1 each by Other route daily. use TO CHECK BLOOD SUGAR every day 50 each 11  . ibuprofen (ADVIL) 200 MG tablet Take 600 mg by mouth every 8 (eight) hours as needed (pain).    . iron polysaccharides (NIFEREX) 150 MG capsule Take 1 capsule by mouth 2 (two) times daily.    . metFORMIN (GLUCOPHAGE) 500 MG tablet Take 2 tablets (1,000 mg total) by mouth 2 (two) times daily with a meal. 120 tablet 2  . metoprolol (LOPRESSOR) 100 MG tablet Take 50-100 mg by mouth See admin instructions. 100 mg in the morning, 50 mg at bedtime    . mupirocin ointment (BACTROBAN) 2 % Apply 1 application topically 3 (three) times daily.    Marland Kitchen omeprazole (PRILOSEC) 20 MG capsule Take 20 mg by mouth See admin instructions. Take 1 cap twice daily before taking steroids, and up to 2 weeks after *ONLY IF TAKING ANY STEROIDS    . oxybutynin (DITROPAN-XL) 10 MG 24 hr tablet Take 10 mg by mouth at bedtime.    . pantoprazole (PROTONIX) 40 MG tablet Take 40 mg by mouth daily.     . Pediatric Multiple Vit-C-FA (CHILDRENS CHEWABLE VITAMINS PO) Take 1 tablet by mouth 2 (two) times daily.    . pregabalin (LYRICA) 300 MG capsule Take 300 mg by mouth 2 (two) times daily.     Marland Kitchen rOPINIRole (REQUIP) 1 MG tablet Take 1-2 mg by mouth at bedtime.     . rosuvastatin (CRESTOR) 10 MG tablet Take 10 mg by mouth 3 (three) times a week.    . traZODone (DESYREL) 50 MG tablet Take 50 mg by mouth at bedtime as needed for sleep.    . valsartan-hydrochlorothiazide (DIOVAN-HCT) 320-12.5 MG tablet Take 1  tablet by mouth daily.     No current facility-administered medications on file prior to visit.    Allergies  Allergen Reactions  . Other  Steroids   Does not tolerate bad for stomach has to take med before and after if uses any steroid   . Penicillins Other (See Comments)    Unknown childhood allergic reaction - no other information available    Family History  Problem Relation Age of Onset  . Diabetes Father     BP 104/68 (BP Location: Left Arm, Patient Position: Sitting, Cuff Size: Large)   Pulse 88   Ht 5\' 8"  (1.727 m)   Wt 235 lb 12.8 oz (107 kg)   SpO2 93%   BMI 35.85 kg/m   Review of Systems He denies hypoglycemia and nausea    Objective:   Physical Exam VITAL SIGNS:  See vs page GENERAL: no distress Pulses: dorsalis pedis intact bilat.   MSK: no deformity of the feet CV: 1+ bilat leg edema Skin:  no ulcer on the feet.  normal color and temp on the feet. Neuro: sensation is intact to touch on the feet  Lab Results  Component Value Date   HGBA1C 7.8 (A) 08/15/2019   Lab Results  Component Value Date   CREATININE 0.90 03/10/2019   BUN 14 03/10/2019   NA 139 03/10/2019   K 4.0 03/10/2019   CL 110 03/10/2019   CO2 23 03/10/2019       Assessment & Plan:  Type 2 DM: worse Edema: This limits rx options Obesity, improved: increasing trulicity will help more.   Patient Instructions  I have sent a prescription to your pharmacy, to increase the trulicity again You can use up the curretn 3 mg pens by taking 1 every 5 days. Please continue the same other diabetes medications check your blood sugar once a day.  vary the time of day when you check, between before the 3 meals, and at bedtime.  also check if you have symptoms of your blood sugar being too high or too low.  please keep a record of the readings and bring it to your next appointment here (or you can bring the meter itself).  You can write it on any piece of paper.  please call 03/12/2019 sooner if your  blood sugar goes below 70, or if you have a lot of readings over 200. Please come back for a follow-up appointment in 2-3 months.

## 2019-08-15 NOTE — Patient Instructions (Signed)
I have sent a prescription to your pharmacy, to increase the trulicity again You can use up the curretn 3 mg pens by taking 1 every 5 days. Please continue the same other diabetes medications check your blood sugar once a day.  vary the time of day when you check, between before the 3 meals, and at bedtime.  also check if you have symptoms of your blood sugar being too high or too low.  please keep a record of the readings and bring it to your next appointment here (or you can bring the meter itself).  You can write it on any piece of paper.  please call us sooner if your blood sugar goes below 70, or if you have a lot of readings over 200. Please come back for a follow-up appointment in 2-3 months.

## 2019-08-24 LAB — HM DIABETES EYE EXAM

## 2019-10-01 ENCOUNTER — Other Ambulatory Visit: Payer: Self-pay | Admitting: Endocrinology

## 2019-10-01 ENCOUNTER — Other Ambulatory Visit: Payer: Self-pay | Admitting: Cardiovascular Disease

## 2019-10-01 DIAGNOSIS — Z794 Long term (current) use of insulin: Secondary | ICD-10-CM

## 2019-10-01 DIAGNOSIS — E119 Type 2 diabetes mellitus without complications: Secondary | ICD-10-CM

## 2019-10-17 ENCOUNTER — Other Ambulatory Visit: Payer: Self-pay

## 2019-10-19 ENCOUNTER — Other Ambulatory Visit: Payer: Self-pay

## 2019-10-19 ENCOUNTER — Encounter: Payer: Self-pay | Admitting: Endocrinology

## 2019-10-19 ENCOUNTER — Ambulatory Visit (INDEPENDENT_AMBULATORY_CARE_PROVIDER_SITE_OTHER): Payer: Commercial Managed Care - PPO | Admitting: Endocrinology

## 2019-10-19 VITALS — BP 136/82 | HR 85 | Ht 68.0 in | Wt 229.0 lb

## 2019-10-19 DIAGNOSIS — Z794 Long term (current) use of insulin: Secondary | ICD-10-CM | POA: Diagnosis not present

## 2019-10-19 DIAGNOSIS — E119 Type 2 diabetes mellitus without complications: Secondary | ICD-10-CM | POA: Diagnosis not present

## 2019-10-19 LAB — POCT GLYCOSYLATED HEMOGLOBIN (HGB A1C): Hemoglobin A1C: 9.1 % — AB (ref 4.0–5.6)

## 2019-10-19 MED ORDER — GLIMEPIRIDE 2 MG PO TABS: 2.0000 mg | ORAL_TABLET | Freq: Every day | ORAL | 3 refills | Status: DC

## 2019-10-19 NOTE — Patient Instructions (Addendum)
I have sent a prescription to your pharmacy, to add "glimepiride."   Please continue the same other diabetes medications.  check your blood sugar once a day.  vary the time of day when you check, between before the 3 meals, and at bedtime.  also check if you have symptoms of your blood sugar being too high or too low.  please keep a record of the readings and bring it to your next appointment here (or you can bring the meter itself).  You can write it on any piece of paper.  please call us sooner if your blood sugar goes below 70, or if you have a lot of readings over 200.   Please come back for a follow-up appointment in 2 months.    

## 2019-10-19 NOTE — Progress Notes (Signed)
Subjective:    Patient ID: Grant Williams, male    DOB: 12/29/1955, 64 y.o.   MRN: [id number]  HPI Pt returns for f/u of diabetes mellitus:  DM type: 2. Dx'ed: 2008.   Complications: PN, DN, and CAD.   Therapy: Trulicity and 2 oral meds.     DKA: never.  Severe hypoglycemia: never.   Pancreatitis: never.    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   Other: He took insulin 2015-2018; he had gastric bypass surgery in 2018.  Interval history: pt says cbg's are in the 100's.  pt states he feels well in general.   Past Medical History:  Diagnosis Date  . Anemia    iron infusions  . Coronary artery disease   . Cough syncope   . Diabetes mellitus without complication (HCC)   . Fibromyalgia   . GERD (gastroesophageal reflux disease)   . History of kidney stones   . HTN (hypertension)   . Hyperlipidemia   . Hypertension   . Morbid obesity (HCC)   . Pneumonia   . Sleep apnea    dont tolerate cpap  . SOB (shortness of breath)   . Uncontrolled type 2 diabetes mellitus with complication (HCC)   . Vitamin B12 deficiency     Past Surgical History:  Procedure Laterality Date  . ELBOW SURGERY Right   . HERNIA REPAIR     left and right inguinal , umbilical also  . ROUX-EN-Y GASTRIC BYPASS     08-2016  something nicked and pt. had to have 3 units of blood  . SHOULDER ARTHROSCOPY Right   . TRANSURETHRAL RESECTION OF PROSTATE N/A 03/14/2019   Procedure: TRANSURETHRAL RESECTION OF THE PROSTATE (TURP);  Surgeon: Bjorn Pippin, MD;  Location: [facility];  Service: Urology;  Laterality: N/A;    Social History   Socioeconomic History  . Marital status: Married    Spouse name: Not on file  . Number of children: Not on file  . Years of education: Not on file  . Highest education level: Not on file  Occupational History  . Not on file  Tobacco Use  . Smoking status: Former Games developer  . Smokeless tobacco: Former Neurosurgeon    Types: Chew  . Tobacco comment: 30 years ago    Substance and Sexual Activity  . Alcohol use: No  . Drug use: No  . Sexual activity: Not Currently  Other Topics Concern  . Not on file  Social History Narrative  . Not on file   Social Determinants of Health   Financial Resource Strain:   . Difficulty of Paying Living Expenses:   Food Insecurity:   . Worried About Programme researcher, broadcasting/film/video in the Last Year:   . Barista in the Last Year:   Transportation Needs:   . Freight forwarder (Medical):   Marland Kitchen Lack of Transportation (Non-Medical):   Physical Activity:   . Days of Exercise per Week:   . Minutes of Exercise per Session:   Stress:   . Feeling of Stress :   Social Connections:   . Frequency of Communication with Friends and Family:   . Frequency of Social Gatherings with Friends and Family:   . Attends Religious Services:   . Active Member of Clubs or Organizations:   . Attends Banker Meetings:   Marland Kitchen Marital Status:   Intimate Partner Violence:   . Fear of Current or Ex-Partner:   .  Emotionally Abused:   Marland Kitchen Physically Abused:   . Sexually Abused:     Current Outpatient Medications on File Prior to Visit  Medication Sig Dispense Refill  . amLODipine (NORVASC) 10 MG tablet TAKE 1 TABLET(10 MG) BY MOUTH DAILY 90 tablet 0  . ARIPiprazole (ABILIFY) 5 MG tablet Take 5 mg by mouth daily.     Marland Kitchen CALCIUM CITRATE-VITAMIN D PO Take 1 tablet by mouth 2 (two) times daily. 600 + vit d3    . cholecalciferol (VITAMIN D3) 25 MCG (1000 UT) tablet Take 1,000 Units by mouth 2 (two) times daily.    . clotrimazole-betamethasone (LOTRISONE) cream Apply 1 application topically 2 (two) times daily.    . dapagliflozin propanediol (FARXIGA) 5 MG TABS tablet Take 5 mg by mouth daily. 90 tablet 3  . docusate sodium (COLACE) 250 MG capsule Take 250-500 mg by mouth See admin instructions. 500 mg in the morning, 250 mg in the evening    . Dulaglutide (TRULICITY) 4.5 ZO/1.0RU SOPN Inject 4.5 mg into the skin once a week. 4 pen 11  .  DULoxetine (CYMBALTA) 30 MG capsule Take 30 mg by mouth 3 (three) times daily.     . ergocalciferol (VITAMIN D2) 50000 units capsule Take 50,000 Units by mouth 2 (two) times a week. Mon and Fri    . gabapentin (NEURONTIN) 800 MG tablet Take 800 mg by mouth 4 (four) times daily.     Marland Kitchen glucose blood (ONETOUCH VERIO) test strip 1 each by Other route daily. use TO CHECK BLOOD SUGAR every day 50 each 11  . ibuprofen (ADVIL) 200 MG tablet Take 600 mg by mouth every 8 (eight) hours as needed (pain).    . iron polysaccharides (NIFEREX) 150 MG capsule Take 1 capsule by mouth 2 (two) times daily.    . metFORMIN (GLUCOPHAGE) 500 MG tablet TAKE 2 TABLETS(1000 MG) BY MOUTH TWICE DAILY WITH A MEAL 120 tablet 2  . metoprolol (LOPRESSOR) 100 MG tablet Take 50-100 mg by mouth See admin instructions. 100 mg in the morning, 50 mg at bedtime    . mupirocin ointment (BACTROBAN) 2 % Apply 1 application topically 3 (three) times daily.    Marland Kitchen omeprazole (PRILOSEC) 20 MG capsule Take 20 mg by mouth See admin instructions. Take 1 cap twice daily before taking steroids, and up to 2 weeks after *ONLY IF TAKING ANY STEROIDS    . oxybutynin (DITROPAN-XL) 10 MG 24 hr tablet Take 10 mg by mouth at bedtime.    . pantoprazole (PROTONIX) 40 MG tablet Take 40 mg by mouth daily.     . Pediatric Multiple Vit-C-FA (CHILDRENS CHEWABLE VITAMINS PO) Take 1 tablet by mouth 2 (two) times daily.    . pregabalin (LYRICA) 300 MG capsule Take 300 mg by mouth 2 (two) times daily.     Marland Kitchen rOPINIRole (REQUIP) 1 MG tablet Take 1-2 mg by mouth at bedtime.     . rosuvastatin (CRESTOR) 10 MG tablet Take 10 mg by mouth 3 (three) times a week.    . traZODone (DESYREL) 50 MG tablet Take 50 mg by mouth at bedtime as needed for sleep.    . valsartan-hydrochlorothiazide (DIOVAN-HCT) 320-12.5 MG tablet Take 1 tablet by mouth daily.     No current facility-administered medications on file prior to visit.    Allergies  Allergen Reactions  . Other      Steroids   Does not tolerate bad for stomach has to take med before and after if uses any steroid   .  Penicillins Other (See Comments)    Unknown childhood allergic reaction - no other information available    Family History  Problem Relation Age of Onset  . Diabetes Father     BP 136/82   Pulse 85   Ht 5\' 8"  (1.727 m)   Wt 229 lb (103.9 kg)   SpO2 93%   BMI 34.82 kg/m    Review of Systems Denies nausea    Objective:   Physical Exam VITAL SIGNS:  See vs page GENERAL: no distress Pulses: dorsalis pedis intact bilat.   MSK: no deformity of the feet CV: 1+ bilat leg edema.  Skin:  no ulcer on the feet.  normal color and temp on the feet.  Neuro: sensation is intact to touch on the feet.    A1c=9.1%     Assessment & Plan:  Type 2 DM, with CAD: worse Edema: This limits rx options  Patient Instructions  I have sent a prescription to your pharmacy, to add "glimepiride." Please continue the same other diabetes medications.   check your blood sugar once a day.  vary the time of day when you check, between before the 3 meals, and at bedtime.  also check if you have symptoms of your blood sugar being too high or too low.  please keep a record of the readings and bring it to your next appointment here (or you can bring the meter itself).  You can write it on any piece of paper.  please call sooner if your blood sugar goes below 70, or if you have a lot of readings over 200. Please come back for a follow-up appointment in 2 months.

## 2019-11-07 ENCOUNTER — Encounter: Payer: Self-pay | Admitting: General Practice

## 2019-12-21 ENCOUNTER — Encounter: Payer: Self-pay | Admitting: Endocrinology

## 2019-12-21 ENCOUNTER — Ambulatory Visit (INDEPENDENT_AMBULATORY_CARE_PROVIDER_SITE_OTHER): Payer: Commercial Managed Care - PPO | Admitting: Endocrinology

## 2019-12-21 ENCOUNTER — Other Ambulatory Visit: Payer: Self-pay

## 2019-12-21 VITALS — BP 132/80 | HR 73 | Ht 68.0 in | Wt 239.2 lb

## 2019-12-21 DIAGNOSIS — Z794 Long term (current) use of insulin: Secondary | ICD-10-CM

## 2019-12-21 DIAGNOSIS — E119 Type 2 diabetes mellitus without complications: Secondary | ICD-10-CM | POA: Diagnosis not present

## 2019-12-21 LAB — POCT GLYCOSYLATED HEMOGLOBIN (HGB A1C): Hemoglobin A1C: 6.3 % — AB (ref 4.0–5.6)

## 2019-12-21 MED ORDER — GLIMEPIRIDE 1 MG PO TABS: 1.0000 mg | ORAL_TABLET | Freq: Every day | ORAL | 3 refills | Status: DC

## 2019-12-21 NOTE — Progress Notes (Signed)
Subjective:    Patient ID: Grant Williams, male    DOB: [birth year], 64 y.o.   MRN: [id number]  HPI Pt returns for f/u of diabetes mellitus:  DM type: 2. Dx'ed: 2008.   Complications: PN, DN, and CAD.   Therapy: Trulicity and 3 oral meds.     DKA: never.  Severe hypoglycemia: never.   Pancreatitis: never.    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   Other: He took insulin 2015-2018; he had gastric bypass surgery in 2018.  Interval history: pt says cbg's are in the 100's.  pt states he feels well in general.  He takes meds as rx'ed Past Medical History:  Diagnosis Date  . Anemia    iron infusions  . Coronary artery disease   . Cough syncope   . Diabetes mellitus without complication (HCC)   . Fibromyalgia   . GERD (gastroesophageal reflux disease)   . History of kidney stones   . HTN (hypertension)   . Hyperlipidemia   . Hypertension   . Morbid obesity (HCC)   . Pneumonia   . Sleep apnea    dont tolerate cpap  . SOB (shortness of breath)   . Uncontrolled type 2 diabetes mellitus with complication (HCC)   . Vitamin B12 deficiency     Past Surgical History:  Procedure Laterality Date  . ELBOW SURGERY Right   . HERNIA REPAIR     left and right inguinal , umbilical also  . ROUX-EN-Y GASTRIC BYPASS     08-2016  something nicked and pt. had to have 3 units of blood  . SHOULDER ARTHROSCOPY Right   . TRANSURETHRAL RESECTION OF PROSTATE N/A 03/14/2019   Procedure: TRANSURETHRAL RESECTION OF THE PROSTATE (TURP);  Surgeon: Bjorn Pippin, MD;  Location: [facility];  Service: Urology;  Laterality: N/A;    Social History   Socioeconomic History  . Marital status: Married    Spouse name: Not on file  . Number of children: Not on file  . Years of education: Not on file  . Highest education level: Not on file  Occupational History  . Not on file  Tobacco Use  . Smoking status: Former Games developer  . Smokeless tobacco: Former Neurosurgeon    Types: Chew  . Tobacco  comment: 30 years ago  Vaping Use  . Vaping Use: Never used  Substance and Sexual Activity  . Alcohol use: No  . Drug use: No  . Sexual activity: Not Currently  Other Topics Concern  . Not on file  Social History Narrative  . Not on file   Social Determinants of Health   Financial Resource Strain:   . Difficulty of Paying Living Expenses:   Food Insecurity:   . Worried About Programme researcher, broadcasting/film/video in the Last Year:   . Barista in the Last Year:   Transportation Needs:   . Freight forwarder (Medical):   Marland Kitchen Lack of Transportation (Non-Medical):   Physical Activity:   . Days of Exercise per Week:   . Minutes of Exercise per Session:   Stress:   . Feeling of Stress :   Social Connections:   . Frequency of Communication with Friends and Family:   . Frequency of Social Gatherings with Friends and Family:   . Attends Religious Services:   . Active Member of Clubs or Organizations:   . Attends Banker Meetings:   Marland Kitchen Marital Status:   Intimate  Partner Violence:   . Fear of Current or Ex-Partner:   . Emotionally Abused:   Marland Kitchen Physically Abused:   . Sexually Abused:     Current Outpatient Medications on File Prior to Visit  Medication Sig Dispense Refill  . amLODipine (NORVASC) 10 MG tablet TAKE 1 TABLET(10 MG) BY MOUTH DAILY 90 tablet 0  . ARIPiprazole (ABILIFY) 5 MG tablet Take 5 mg by mouth daily.     Marland Kitchen CALCIUM CITRATE-VITAMIN D PO Take 1 tablet by mouth 2 (two) times daily. 600 + vit d3    . cholecalciferol (VITAMIN D3) 25 MCG (1000 UT) tablet Take 1,000 Units by mouth 2 (two) times daily.    . clotrimazole-betamethasone (LOTRISONE) cream Apply 1 application topically 2 (two) times daily.    . dapagliflozin propanediol (FARXIGA) 5 MG TABS tablet Take 5 mg by mouth daily. 90 tablet 3  . docusate sodium (COLACE) 250 MG capsule Take 250-500 mg by mouth See admin instructions. 500 mg in the morning, 250 mg in the evening    . Dulaglutide (TRULICITY) 4.5  MG/0.5ML SOPN Inject 4.5 mg into the skin once a week. 4 pen 11  . DULoxetine (CYMBALTA) 30 MG capsule Take 30 mg by mouth 3 (three) times daily.     . ergocalciferol (VITAMIN D2) 50000 units capsule Take 50,000 Units by mouth 2 (two) times a week. Mon and Fri    . gabapentin (NEURONTIN) 800 MG tablet Take 800 mg by mouth 4 (four) times daily.     Marland Kitchen glucose blood (ONETOUCH VERIO) test strip 1 each by Other route daily. use TO CHECK BLOOD SUGAR every day 50 each 11  . ibuprofen (ADVIL) 200 MG tablet Take 600 mg by mouth every 8 (eight) hours as needed (pain).    . iron polysaccharides (NIFEREX) 150 MG capsule Take 1 capsule by mouth 2 (two) times daily.    . metFORMIN (GLUCOPHAGE) 500 MG tablet TAKE 2 TABLETS(1000 MG) BY MOUTH TWICE DAILY WITH A MEAL 120 tablet 2  . metoprolol (LOPRESSOR) 100 MG tablet Take 50-100 mg by mouth See admin instructions. 100 mg in the morning, 50 mg at bedtime    . mupirocin ointment (BACTROBAN) 2 % Apply 1 application topically 3 (three) times daily.    Marland Kitchen omeprazole (PRILOSEC) 20 MG capsule Take 20 mg by mouth See admin instructions. Take 1 cap twice daily before taking steroids, and up to 2 weeks after *ONLY IF TAKING ANY STEROIDS    . oxybutynin (DITROPAN-XL) 10 MG 24 hr tablet Take 10 mg by mouth at bedtime.    . pantoprazole (PROTONIX) 40 MG tablet Take 40 mg by mouth daily.     . Pediatric Multiple Vit-C-FA (CHILDRENS CHEWABLE VITAMINS PO) Take 1 tablet by mouth 2 (two) times daily.    . pregabalin (LYRICA) 300 MG capsule Take 300 mg by mouth 2 (two) times daily.     Marland Kitchen rOPINIRole (REQUIP) 1 MG tablet Take 1-2 mg by mouth at bedtime.     . rosuvastatin (CRESTOR) 10 MG tablet Take 10 mg by mouth 3 (three) times a week.    . traZODone (DESYREL) 50 MG tablet Take 50 mg by mouth at bedtime as needed for sleep.    . valsartan-hydrochlorothiazide (DIOVAN-HCT) 320-12.5 MG tablet Take 1 tablet by mouth daily.     No current facility-administered medications on file prior  to visit.    Allergies  Allergen Reactions  . Other     Steroids   Does not tolerate bad  for stomach has to take med before and after if uses any steroid   . Penicillins Other (See Comments)    Unknown childhood allergic reaction - no other information available    Family History  Problem Relation Age of Onset  . Diabetes Father     BP 132/80   Pulse 73   Ht 5\' 8"  (1.727 m)   Wt 239 lb 3.2 oz (108.5 kg)   SpO2 92%   BMI 36.37 kg/m    Review of Systems He denies hypoglycemia     Objective:   Physical Exam VITAL SIGNS:  See vs page GENERAL: no distress Pulses: dorsalis pedis intact bilat.   MSK: no deformity of the feet CV: 2+ bilat leg edema Skin:  no ulcer on the feet.  normal color and temp on the feet. Neuro: sensation is intact to touch on the feet.     Lab Results  Component Value Date   CREATININE 0.90 03/10/2019   BUN 14 03/10/2019   NA 139 03/10/2019   K 4.0 03/10/2019   CL 110 03/10/2019   CO2 23 03/10/2019   A1c=6.3%     Assessment & Plan:  Type 2 DM: ovecontrolled, for this SU-containing regimen  Patient Instructions  I have sent a prescription to your pharmacy, to reduce the glimepiride. Please continue the same other diabetes medications.   check your blood sugar once a day.  vary the time of day when you check, between before the 3 meals, and at bedtime.  also check if you have symptoms of your blood sugar being too high or too low.  please keep a record of the readings and bring it to your next appointment here (or you can bring the meter itself).  You can write it on any piece of paper.  please call 03/12/2019 sooner if your blood sugar goes below 70, or if you have a lot of readings over 200. Please come back for a follow-up appointment in 3 months.

## 2019-12-21 NOTE — Patient Instructions (Addendum)
I have sent a prescription to your pharmacy, to reduce the glimepiride. Please continue the same other diabetes medications.   check your blood sugar once a day.  vary the time of day when you check, between before the 3 meals, and at bedtime.  also check if you have symptoms of your blood sugar being too high or too low.  please keep a record of the readings and bring it to your next appointment here (or you can bring the meter itself).  You can write it on any piece of paper.  please call us sooner if your blood sugar goes below 70, or if you have a lot of readings over 200. Please come back for a follow-up appointment in 3 months.

## 2019-12-24 ENCOUNTER — Other Ambulatory Visit: Payer: Self-pay | Admitting: Endocrinology

## 2019-12-24 ENCOUNTER — Other Ambulatory Visit: Payer: Self-pay | Admitting: Cardiovascular Disease

## 2019-12-24 DIAGNOSIS — E119 Type 2 diabetes mellitus without complications: Secondary | ICD-10-CM

## 2019-12-25 ENCOUNTER — Other Ambulatory Visit: Payer: Self-pay | Admitting: Endocrinology

## 2020-03-21 ENCOUNTER — Other Ambulatory Visit: Payer: Self-pay | Admitting: Endocrinology

## 2020-03-21 ENCOUNTER — Other Ambulatory Visit: Payer: Self-pay | Admitting: Cardiovascular Disease

## 2020-03-21 DIAGNOSIS — Z794 Long term (current) use of insulin: Secondary | ICD-10-CM

## 2020-03-26 DIAGNOSIS — M5030 Other cervical disc degeneration, unspecified cervical region: Secondary | ICD-10-CM | POA: Insufficient documentation

## 2020-03-28 ENCOUNTER — Other Ambulatory Visit: Payer: Self-pay

## 2020-03-28 ENCOUNTER — Ambulatory Visit (INDEPENDENT_AMBULATORY_CARE_PROVIDER_SITE_OTHER): Payer: Commercial Managed Care - PPO | Admitting: Endocrinology

## 2020-03-28 ENCOUNTER — Encounter: Payer: Self-pay | Admitting: Endocrinology

## 2020-03-28 VITALS — BP 142/80 | HR 70 | Ht 68.0 in | Wt 238.0 lb

## 2020-03-28 DIAGNOSIS — E119 Type 2 diabetes mellitus without complications: Secondary | ICD-10-CM

## 2020-03-28 DIAGNOSIS — Z794 Long term (current) use of insulin: Secondary | ICD-10-CM

## 2020-03-28 LAB — POCT GLYCOSYLATED HEMOGLOBIN (HGB A1C): Hemoglobin A1C: 6.4 % — AB (ref 4.0–5.6)

## 2020-03-28 MED ORDER — ONETOUCH ULTRASOFT LANCETS MISC: 12 refills | Status: DC

## 2020-03-28 MED ORDER — ONETOUCH VERIO W/DEVICE KIT: PACK | 0 refills | Status: DC

## 2020-03-28 MED ORDER — GLIMEPIRIDE 1 MG PO TABS: 0.5000 mg | ORAL_TABLET | Freq: Every day | ORAL | 3 refills | Status: DC

## 2020-03-28 MED ORDER — ONETOUCH VERIO VI STRP: ORAL_STRIP | 12 refills | Status: DC

## 2020-03-28 NOTE — Patient Instructions (Addendum)
Your blood pressure is high today.  Please see your primary care provider soon, to have it rechecked Please reduce the glimepiride to 1/2 pill each morning. Please continue the same other diabetes medications.   check your blood sugar once a day.  vary the time of day when you check, between before the 3 meals, and at bedtime.  also check if you have symptoms of your blood sugar being too high or too low.  please keep a record of the readings and bring it to your next appointment here (or you can bring the meter itself).  You can write it on any piece of paper.  please call us sooner if your blood sugar goes below 70, or if you have a lot of readings over 200. Please come back for a follow-up appointment in January.

## 2020-03-28 NOTE — Progress Notes (Signed)
Subjective:    Patient ID: Grant Williams, male    DOB: 19-Aug-1955, 64 y.o.   MRN: [id number]  HPI Pt returns for f/u of diabetes mellitus:  DM type: 2. Dx'ed: 2008.   Complications: PN, DN, and CAD.   Therapy: Trulicity and 3 oral meds.     DKA: never.  Severe hypoglycemia: never.   Pancreatitis: never.    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   Other: He took insulin 2015-2018; he had gastric bypass surgery in 2018.  Interval history: pt says cbg's are in the 100's.  pt states he feels well in general.  He takes meds as rx'ed.  Past Medical History:  Diagnosis Date  . Anemia    iron infusions  . Coronary artery disease   . Cough syncope   . Diabetes mellitus without complication (HCC)   . Fibromyalgia   . GERD (gastroesophageal reflux disease)   . History of kidney stones   . HTN (hypertension)   . Hyperlipidemia   . Hypertension   . Morbid obesity (HCC)   . Pneumonia   . Sleep apnea    dont tolerate cpap  . SOB (shortness of breath)   . Uncontrolled type 2 diabetes mellitus with complication (HCC)   . Vitamin B12 deficiency     Past Surgical History:  Procedure Laterality Date  . ELBOW SURGERY Right   . HERNIA REPAIR     left and right inguinal , umbilical also  . ROUX-EN-Y GASTRIC BYPASS     08-2016  something nicked and pt. had to have 3 units of blood  . SHOULDER ARTHROSCOPY Right   . TRANSURETHRAL RESECTION OF PROSTATE N/A 03/14/2019   Procedure: TRANSURETHRAL RESECTION OF THE PROSTATE (TURP);  Surgeon: Bjorn Pippin, MD;  Location: [facility];  Service: Urology;  Laterality: N/A;    Social History   Socioeconomic History  . Marital status: Married    Spouse name: Not on file  . Number of children: Not on file  . Years of education: Not on file  . Highest education level: Not on file  Occupational History  . Not on file  Tobacco Use  . Smoking status: Former Games developer  . Smokeless tobacco: Former Neurosurgeon    Types: Chew  . Tobacco  comment: 30 years ago  Vaping Use  . Vaping Use: Never used  Substance and Sexual Activity  . Alcohol use: No  . Drug use: No  . Sexual activity: Not Currently  Other Topics Concern  . Not on file  Social History Narrative  . Not on file   Social Determinants of Health   Financial Resource Strain:   . Difficulty of Paying Living Expenses: Not on file  Food Insecurity:   . Worried About Programme researcher, broadcasting/film/video in the Last Year: Not on file  . Ran Out of Food in the Last Year: Not on file  Transportation Needs:   . Lack of Transportation (Medical): Not on file  . Lack of Transportation (Non-Medical): Not on file  Physical Activity:   . Days of Exercise per Week: Not on file  . Minutes of Exercise per Session: Not on file  Stress:   . Feeling of Stress : Not on file  Social Connections:   . Frequency of Communication with Friends and Family: Not on file  . Frequency of Social Gatherings with Friends and Family: Not on file  . Attends Religious Services: Not on file  .  Active Member of Clubs or Organizations: Not on file  . Attends Banker Meetings: Not on file  . Marital Status: Not on file  Intimate Partner Violence:   . Fear of Current or Ex-Partner: Not on file  . Emotionally Abused: Not on file  . Physically Abused: Not on file  . Sexually Abused: Not on file    Current Outpatient Medications on File Prior to Visit  Medication Sig Dispense Refill  . amLODipine (NORVASC) 10 MG tablet TAKE 1 TABLET BY MOUTH 90 tablet 1  . ARIPiprazole (ABILIFY) 5 MG tablet Take 5 mg by mouth daily.     Marland Kitchen CALCIUM CITRATE-VITAMIN D PO Take 1 tablet by mouth 2 (two) times daily. 600 + vit d3    . cholecalciferol (VITAMIN D3) 25 MCG (1000 UT) tablet Take 1,000 Units by mouth 2 (two) times daily.    . clotrimazole-betamethasone (LOTRISONE) cream Apply 1 application topically 2 (two) times daily.    Marland Kitchen docusate sodium (COLACE) 250 MG capsule Take 250-500 mg by mouth See admin  instructions. 500 mg in the morning, 250 mg in the evening    . Dulaglutide (TRULICITY) 4.5 MG/0.5ML SOPN Inject 4.5 mg into the skin once a week. 4 pen 11  . DULoxetine (CYMBALTA) 30 MG capsule Take 30 mg by mouth 3 (three) times daily.     . ergocalciferol (VITAMIN D2) 50000 units capsule Take 50,000 Units by mouth 2 (two) times a week. Mon and Fri    . FARXIGA 5 MG TABS tablet TAKE 1 TABLET BY MOUTH EVERY DAY 90 tablet 3  . gabapentin (NEURONTIN) 800 MG tablet Take 800 mg by mouth 4 (four) times daily.     Marland Kitchen ibuprofen (ADVIL) 200 MG tablet Take 600 mg by mouth every 8 (eight) hours as needed (pain).    . iron polysaccharides (NIFEREX) 150 MG capsule Take 1 capsule by mouth 2 (two) times daily.    . metFORMIN (GLUCOPHAGE) 500 MG tablet TAKE 2 TABLETS(1000 MG) BY MOUTH TWICE DAILY WITH A MEAL 120 tablet 0  . metoprolol (LOPRESSOR) 100 MG tablet Take 50-100 mg by mouth See admin instructions. 100 mg in the morning, 50 mg at bedtime    . mupirocin ointment (BACTROBAN) 2 % Apply 1 application topically 3 (three) times daily.    Marland Kitchen omeprazole (PRILOSEC) 20 MG capsule Take 20 mg by mouth See admin instructions. Take 1 cap twice daily before taking steroids, and up to 2 weeks after *ONLY IF TAKING ANY STEROIDS    . oxybutynin (DITROPAN-XL) 10 MG 24 hr tablet Take 10 mg by mouth at bedtime.    . pantoprazole (PROTONIX) 40 MG tablet Take 40 mg by mouth daily.     . Pediatric Multiple Vit-C-FA (CHILDRENS CHEWABLE VITAMINS PO) Take 1 tablet by mouth 2 (two) times daily.    . pregabalin (LYRICA) 300 MG capsule Take 300 mg by mouth 2 (two) times daily.     Marland Kitchen rOPINIRole (REQUIP) 1 MG tablet Take 1-2 mg by mouth at bedtime.     . rosuvastatin (CRESTOR) 10 MG tablet Take 10 mg by mouth 3 (three) times a week.    . traZODone (DESYREL) 50 MG tablet Take 50 mg by mouth at bedtime as needed for sleep.    . valsartan-hydrochlorothiazide (DIOVAN-HCT) 320-12.5 MG tablet Take 1 tablet by mouth daily.     No current  facility-administered medications on file prior to visit.    Allergies  Allergen Reactions  . Other  Steroids   Does not tolerate bad for stomach has to take med before and after if uses any steroid   . Penicillins Other (See Comments)    Unknown childhood allergic reaction - no other information available    Family History  Problem Relation Age of Onset  . Diabetes Father     BP (!) 142/80   Pulse 70   Ht 5\' 8"  (1.727 m)   Wt 238 lb (108 kg)   SpO2 95%   BMI 36.19 kg/m    Review of Systems He denies hypoglycemia    Objective:   Physical Exam VITAL SIGNS:  See vs page GENERAL: no distress Pulses: dorsalis pedis intact bilat.   MSK: no deformity of the feet CV: 1+ bilat leg edema Skin:  no ulcer on the feet.  normal color and temp on the feet. Neuro: sensation is intact to touch on the feet, but decreased from normal.  Lab Results  Component Value Date   HGBA1C 6.4 (A) 03/28/2020        Assessment & Plan:  HTN: is noted today Type 2 DM: overcontrolled, for this SU-containing regimen.   Patient Instructions  Your blood pressure is high today.  Please see your primary care provider soon, to have it rechecked Please reduce the glimepiride to 1/2 pill each morning. Please continue the same other diabetes medications.   check your blood sugar once a day.  vary the time of day when you check, between before the 3 meals, and at bedtime.  also check if you have symptoms of your blood sugar being too high or too low.  please keep a record of the readings and bring it to your next appointment here (or you can bring the meter itself).  You can write it on any piece of paper.  please call 05/28/2020 sooner if your blood sugar goes below 70, or if you have a lot of readings over 200. Please come back for a follow-up appointment in January.

## 2020-04-18 ENCOUNTER — Other Ambulatory Visit: Payer: Self-pay | Admitting: Endocrinology

## 2020-04-18 DIAGNOSIS — E119 Type 2 diabetes mellitus without complications: Secondary | ICD-10-CM

## 2020-05-19 ENCOUNTER — Other Ambulatory Visit: Payer: Self-pay | Admitting: Endocrinology

## 2020-05-19 DIAGNOSIS — E119 Type 2 diabetes mellitus without complications: Secondary | ICD-10-CM

## 2020-06-17 ENCOUNTER — Other Ambulatory Visit: Payer: Self-pay | Admitting: Endocrinology

## 2020-06-17 DIAGNOSIS — E119 Type 2 diabetes mellitus without complications: Secondary | ICD-10-CM

## 2020-06-28 ENCOUNTER — Ambulatory Visit (INDEPENDENT_AMBULATORY_CARE_PROVIDER_SITE_OTHER): Payer: Commercial Managed Care - PPO | Admitting: Endocrinology

## 2020-06-28 ENCOUNTER — Encounter: Payer: Self-pay | Admitting: Endocrinology

## 2020-06-28 ENCOUNTER — Other Ambulatory Visit: Payer: Self-pay

## 2020-06-28 VITALS — BP 140/90 | HR 72 | Ht 68.0 in | Wt 245.8 lb

## 2020-06-28 DIAGNOSIS — Z794 Long term (current) use of insulin: Secondary | ICD-10-CM

## 2020-06-28 DIAGNOSIS — E119 Type 2 diabetes mellitus without complications: Secondary | ICD-10-CM

## 2020-06-28 LAB — POCT GLYCOSYLATED HEMOGLOBIN (HGB A1C): Hemoglobin A1C: 7.4 % — AB (ref 4.0–5.6)

## 2020-06-28 MED ORDER — DAPAGLIFLOZIN PROPANEDIOL 10 MG PO TABS: 10.0000 mg | ORAL_TABLET | Freq: Every day | ORAL | 3 refills | Status: DC

## 2020-06-28 NOTE — Patient Instructions (Addendum)
I have sent a prescription to your pharmacy, to increase the Farxiga. Please continue the same other diabetes medications.   check your blood sugar once a day.  vary the time of day when you check, between before the 3 meals, and at bedtime.  also check if you have symptoms of your blood sugar being too high or too low.  please keep a record of the readings and bring it to your next appointment here (or you can bring the meter itself).  You can write it on any piece of paper.  please call us sooner if your blood sugar goes below 70, or if you have a lot of readings over 200.   Please come back for a follow-up appointment in 3 months.   

## 2020-06-28 NOTE — Progress Notes (Signed)
Subjective:    Patient ID: Grant Williams, male    DOB: [birth year], 65 y.o.   MRN: [id number]  HPI Pt returns for f/u of diabetes mellitus:  DM type: 2. Dx'ed: 2008.   Complications: PN, DN, and CAD.   Therapy: Trulicity and 3 oral meds.     DKA: never.  Severe hypoglycemia: never.   Pancreatitis: never.    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   Other: He took insulin 2015-2018; he had gastric bypass surgery in 2018.   Interval history: pt says cbg's vary from 110-210.  pt states he feels well in general, except for back pain (no steroids).  He takes meds as rx'ed.  Past Medical History:  Diagnosis Date  . Anemia    iron infusions  . Coronary artery disease   . Cough syncope   . Diabetes mellitus without complication ([place])   . Fibromyalgia   . GERD (gastroesophageal reflux disease)   . History of kidney stones   . HTN (hypertension)   . Hyperlipidemia   . Hypertension   . Morbid obesity ([place])   . Pneumonia   . Sleep apnea    dont tolerate cpap  . SOB (shortness of breath)   . Uncontrolled type 2 diabetes mellitus with complication ([place])   . Vitamin B12 deficiency     Past Surgical History:  Procedure Laterality Date  . ELBOW SURGERY Right   . HERNIA REPAIR     left and right inguinal , umbilical also  . ROUX-EN-Y GASTRIC BYPASS     08-2016  something nicked and pt. had to have 3 units of blood  . SHOULDER ARTHROSCOPY Right   . TRANSURETHRAL RESECTION OF PROSTATE N/A 03/14/2019   Procedure: TRANSURETHRAL RESECTION OF THE PROSTATE (TURP);  Surgeon: Irine Seal, MD;  Location: [facility];  Service: Urology;  Laterality: N/A;    Social History   Socioeconomic History  . Marital status: Married    Spouse name: Not on file  . Number of children: Not on file  . Years of education: Not on file  . Highest education level: Not on file  Occupational History  . Not on file  Tobacco Use  . Smoking status: Former Research scientist (life sciences)  . Smokeless tobacco:  Former Systems developer    Types: Chew  . Tobacco comment: 30 years ago  Vaping Use  . Vaping Use: Never used  Substance and Sexual Activity  . Alcohol use: No  . Drug use: No  . Sexual activity: Not Currently  Other Topics Concern  . Not on file  Social History Narrative  . Not on file   Social Determinants of Health   Financial Resource Strain: Not on file  Food Insecurity: Not on file  Transportation Needs: Not on file  Physical Activity: Not on file  Stress: Not on file  Social Connections: Not on file  Intimate Partner Violence: Not on file    Current Outpatient Medications on File Prior to Visit  Medication Sig Dispense Refill  . amLODipine (NORVASC) 10 MG tablet TAKE 1 TABLET BY MOUTH 90 tablet 1  . ARIPiprazole (ABILIFY) 5 MG tablet Take 5 mg by mouth daily.     . Blood Glucose Monitoring Suppl (ONETOUCH VERIO) w/Device KIT Use 1-4 times daily as needed/directed DX E11.9 1 kit 0  . CALCIUM CITRATE-VITAMIN D PO Take 1 tablet by mouth 2 (two) times daily. 600 + vit d3    . cholecalciferol (VITAMIN D3)  25 MCG (1000 UT) tablet Take 1,000 Units by mouth 2 (two) times daily.    . clotrimazole-betamethasone (LOTRISONE) cream Apply 1 application topically 2 (two) times daily.    Marland Kitchen docusate sodium (COLACE) 250 MG capsule Take 250-500 mg by mouth See admin instructions. 500 mg in the morning, 250 mg in the evening    . Dulaglutide (TRULICITY) 4.5 ER/7.4YC SOPN Inject 4.5 mg into the skin once a week. 4 pen 11  . DULoxetine (CYMBALTA) 30 MG capsule Take 30 mg by mouth 3 (three) times daily.     . ergocalciferol (VITAMIN D2) 50000 units capsule Take 50,000 Units by mouth 2 (two) times a week. Mon and Fri    . gabapentin (NEURONTIN) 800 MG tablet Take 800 mg by mouth 4 (four) times daily.     Marland Kitchen glimepiride (AMARYL) 1 MG tablet Take 0.5 tablets (0.5 mg total) by mouth daily with breakfast. 90 tablet 3  . ibuprofen (ADVIL) 200 MG tablet Take 600 mg by mouth every 8 (eight) hours as needed (pain).     . iron polysaccharides (NIFEREX) 150 MG capsule Take 1 capsule by mouth 2 (two) times daily.    . Lancets (ONETOUCH ULTRASOFT) lancets Use 1-4 times daily as needed/directed  DX E11.9 200 each 12  . metFORMIN (GLUCOPHAGE) 500 MG tablet TAKE 2 TABLETS(1000 MG) BY MOUTH TWICE DAILY WITH A MEAL 120 tablet 3  . metoprolol (LOPRESSOR) 100 MG tablet Take 50-100 mg by mouth See admin instructions. 100 mg in the morning, 50 mg at bedtime    . mupirocin ointment (BACTROBAN) 2 % Apply 1 application topically 3 (three) times daily.    Marland Kitchen omeprazole (PRILOSEC) 20 MG capsule Take 20 mg by mouth See admin instructions. Take 1 cap twice daily before taking steroids, and up to 2 weeks after *ONLY IF TAKING ANY STEROIDS    . ONETOUCH VERIO test strip Use 1-4 times daily as directed/needed   DX E11.9 200 each 12  . oxybutynin (DITROPAN-XL) 10 MG 24 hr tablet Take 10 mg by mouth at bedtime.    . pantoprazole (PROTONIX) 40 MG tablet Take 40 mg by mouth daily.     . Pediatric Multiple Vit-C-FA (CHILDRENS CHEWABLE VITAMINS PO) Take 1 tablet by mouth 2 (two) times daily.    . pregabalin (LYRICA) 300 MG capsule Take 300 mg by mouth 2 (two) times daily.     Marland Kitchen rOPINIRole (REQUIP) 1 MG tablet Take 1-2 mg by mouth at bedtime.     . rosuvastatin (CRESTOR) 10 MG tablet Take 10 mg by mouth 3 (three) times a week.    . traZODone (DESYREL) 50 MG tablet Take 50 mg by mouth at bedtime as needed for sleep.    . valsartan-hydrochlorothiazide (DIOVAN-HCT) 320-12.5 MG tablet Take 1 tablet by mouth daily.     No current facility-administered medications on file prior to visit.    Allergies  Allergen Reactions  . Other     Steroids   Does not tolerate bad for stomach has to take med before and after if uses any steroid   . Penicillins Other (See Comments)    Unknown childhood allergic reaction - no other information available    Family History  Problem Relation Age of Onset  . Diabetes Father     BP 140/90   Pulse 72    Ht 5' 8"  (1.727 m)   Wt 245 lb 12.8 oz (111.5 kg)   SpO2 99%   BMI 37.37 kg/m  Review of Systems He denies hypoglycemia.      Objective:   Physical Exam VITAL SIGNS:  See vs page GENERAL: no distress Pulses: dorsalis pedis intact bilat.   MSK: no deformity of the feet CV: 1+ bilat leg edema Skin:  no ulcer on the feet.  normal color and temp on the feet. Neuro: sensation is intact to touch on the feet Ext: there is bilateral onychomycosis of the toenails.     Lab Results  Component Value Date   HGBA1C 7.4 (A) 06/28/2020       Assessment & Plan:  Type 2 DM, with CAD: uncontrolled.     Patient Instructions  I have sent a prescription to your pharmacy, to increase the [place]. Please continue the same other diabetes medications.   check your blood sugar once a day.  vary the time of day when you check, between before the 3 meals, and at bedtime.  also check if you have symptoms of your blood sugar being too high or too low.  please keep a record of the readings and bring it to your next appointment here (or you can bring the meter itself).  You can write it on any piece of paper.  please call us sooner if your blood sugar goes below 70, or if you have a lot of readings over 200.  Please come back for a follow-up appointment in 3 months.

## 2020-07-20 ENCOUNTER — Other Ambulatory Visit: Payer: Self-pay | Admitting: Endocrinology

## 2020-09-11 ENCOUNTER — Other Ambulatory Visit: Payer: Self-pay | Admitting: Cardiovascular Disease

## 2020-09-27 ENCOUNTER — Ambulatory Visit: Payer: Commercial Managed Care - PPO | Admitting: Endocrinology

## 2020-10-08 ENCOUNTER — Other Ambulatory Visit: Payer: Self-pay | Admitting: Endocrinology

## 2020-10-08 DIAGNOSIS — Z794 Long term (current) use of insulin: Secondary | ICD-10-CM

## 2020-10-08 DIAGNOSIS — E119 Type 2 diabetes mellitus without complications: Secondary | ICD-10-CM

## 2020-10-16 ENCOUNTER — Ambulatory Visit (INDEPENDENT_AMBULATORY_CARE_PROVIDER_SITE_OTHER): Payer: Commercial Managed Care - PPO | Admitting: Cardiovascular Disease

## 2020-10-16 ENCOUNTER — Other Ambulatory Visit: Payer: Self-pay

## 2020-10-16 ENCOUNTER — Encounter: Payer: Self-pay | Admitting: Cardiovascular Disease

## 2020-10-16 DIAGNOSIS — E782 Mixed hyperlipidemia: Secondary | ICD-10-CM

## 2020-10-16 DIAGNOSIS — I10 Essential (primary) hypertension: Secondary | ICD-10-CM

## 2020-10-16 DIAGNOSIS — G4733 Obstructive sleep apnea (adult) (pediatric): Secondary | ICD-10-CM

## 2020-10-16 NOTE — Patient Instructions (Signed)
Medication Instructions:  Your physician recommends that you continue on your current medications as directed. Please refer to the Current Medication list given to you today.  *If you need a refill on your cardiac medications before your next appointment, please call your pharmacy*   Follow-Up: At [facility], you and your health needs are our priority.  As part of our continuing mission to provide you with exceptional heart care, we have created designated Provider Care Teams.  These Care Teams include your primary Cardiologist (physician) and Advanced Practice Providers (APPs -  Physician Assistants and Nurse Practitioners) who all work together to provide you with the care you need, when you need it.  We recommend signing up for the patient portal called "MyChart".  Sign up information is provided on this After Visit Summary.  MyChart is used to connect with patients for Virtual Visits (Telemedicine).  Patients are able to view lab/test results, encounter notes, upcoming appointments, etc.  Non-urgent messages can be sent to your provider as well.   To learn more about what you can do with MyChart, go to [email or web address].    Your next appointment:   No future appointments made at this time. We will see you on an as needed basis.  Provider:   Jonathan Berry, MD 

## 2020-10-16 NOTE — Assessment & Plan Note (Signed)
History of obstructive sleep apnea currently not wearing his BiPAP for technical reasons.

## 2020-10-16 NOTE — Assessment & Plan Note (Signed)
History of CAD status post cardiac catheterization by Dr. Clifton James in 2012 revealing diagonal branch disease treated medically.  He denies chest pain.

## 2020-10-16 NOTE — Assessment & Plan Note (Signed)
History of essential hypertension blood pressure measured today 124/78.  He is on amlodipine and metoprolol, as well as Diovan and hydrochlorothiazide.

## 2020-10-16 NOTE — Progress Notes (Addendum)
10/16/2020 Grant Williams   [birth year]  [id number]  Primary Physician Vicenta Aly, [place] Primary Cardiologist: Lorretta Harp MD FACP, [place], [place], [place]  HPI:  Grant Williams is a 65 y.o.  severely overweight married Caucasian male father of 2, grandfather 3 grandchildren who I last saw in the office 09/21/2017.  He was referred by Nonda Lou, nurse practitioner, for cardiovascular evaluation and clearance for his planned Roux-en-Y gastric bypass operation early next year. History of factors include treated hypertension, diabetes and hyperlipidemia. He has never had a heart attack or stroke. He denies chest pain but does get some dyspnea probably related to obesity. 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 Does have sleep apnea on BiPAP in the past which she no longer uses.  He has seen Dr.McAlhaneyin the past and has had cardiac catheterization 2002 revealing moderate diagonal branch disease treated medically. He was hospitalized because of elevated CPK of unknown etiology. A 2-D echo was essentially normal and a Myoview stress test showed inferolateral scar without ischemia thought to be right low risk.  He did have a Roux-en-Y gastric bypass procedure performed #53/18 and has lost 50 pounds. He feels clinically improved He denies chest pain or shortness of breath. Does have diabetic peripheral neuropathy which limits his ability to exercise.   Since I saw him 3 years ago he continues to do well.  He denies chest pain or shortness of breath.  Current Meds  Medication Sig  . amLODipine (NORVASC) 10 MG tablet TAKE 1 TABLET BY MOUTH DAILY  . ARIPiprazole (ABILIFY) 5 MG tablet Take 5 mg by mouth daily.   . Blood Glucose Monitoring Suppl (ONETOUCH VERIO) w/Device KIT Use 1-4 times daily as needed/directed DX E11.9  . CALCIUM CITRATE-VITAMIN D PO Take 1 tablet by mouth 2 (two) times daily. 600 + vit d3  . cholecalciferol (VITAMIN D3) 25 MCG (1000 UT) tablet Take  1,000 Units by mouth 2 (two) times daily.  . clotrimazole-betamethasone (LOTRISONE) cream Apply 1 application topically 2 (two) times daily.  . dapagliflozin propanediol (FARXIGA) 10 MG TABS tablet Take 1 tablet (10 mg total) by mouth daily before breakfast.  . docusate sodium (COLACE) 250 MG capsule Take 250-500 mg by mouth See admin instructions. 500 mg in the morning, 250 mg in the evening  . DULoxetine (CYMBALTA) 30 MG capsule Take 30 mg by mouth 3 (three) times daily.   . ergocalciferol (VITAMIN D2) 50000 units capsule Take 50,000 Units by mouth 2 (two) times a week. Mon and Fri  . gabapentin (NEURONTIN) 800 MG tablet Take 800 mg by mouth 4 (four) times daily.   Marland Kitchen glimepiride (AMARYL) 1 MG tablet Take 0.5 tablets (0.5 mg total) by mouth daily with breakfast.  . ibuprofen (ADVIL) 200 MG tablet Take 600 mg by mouth every 8 (eight) hours as needed (pain).  . iron polysaccharides (NIFEREX) 150 MG capsule Take 1 capsule by mouth 2 (two) times daily.  . Lancets (ONETOUCH ULTRASOFT) lancets Use 1-4 times daily as needed/directed  DX E11.9  . metFORMIN (GLUCOPHAGE) 500 MG tablet TAKE 2 TABLETS(1000 MG) BY MOUTH TWICE DAILY WITH A MEAL  . metoprolol (LOPRESSOR) 100 MG tablet Take 50-100 mg by mouth See admin instructions. 100 mg in the morning, 50 mg at bedtime  . mupirocin ointment (BACTROBAN) 2 % Apply 1 application topically 3 (three) times daily.  Marland Kitchen omeprazole (PRILOSEC) 20 MG capsule Take 20 mg by mouth See admin instructions. Take 1 cap twice  daily before taking steroids, and up to 2 weeks after *ONLY IF TAKING ANY STEROIDS  . ONETOUCH VERIO test strip Use 1-4 times daily as directed/needed   DX E11.9  . oxybutynin (DITROPAN-XL) 10 MG 24 hr tablet Take 10 mg by mouth at bedtime.  . pantoprazole (PROTONIX) 40 MG tablet Take 40 mg by mouth daily.   . Pediatric Multiple Vit-C-FA (CHILDRENS CHEWABLE VITAMINS PO) Take 1 tablet by mouth 2 (two) times daily.  . pregabalin (LYRICA) 300 MG capsule Take  300 mg by mouth 2 (two) times daily.   Marland Kitchen rOPINIRole (REQUIP) 1 MG tablet Take 1-2 mg by mouth at bedtime.   . rosuvastatin (CRESTOR) 10 MG tablet Take 10 mg by mouth 3 (three) times a week.  . traZODone (DESYREL) 50 MG tablet Take 50 mg by mouth at bedtime as needed for sleep.  . TRULICITY 4.5 OH/6.0VP SOPN INJECT 4.5 MG INTO THE SKIN EVERY WEEK  . valsartan-hydrochlorothiazide (DIOVAN-HCT) 320-12.5 MG tablet Take 1 tablet by mouth daily.     Allergies  Allergen Reactions  . Other     Steroids   Does not tolerate bad for stomach has to take med before and after if uses any steroid   . Penicillins Other (See Comments)    Unknown childhood allergic reaction - no other information available    Social History   Socioeconomic History  . Marital status: Married    Spouse name: Not on file  . Number of children: Not on file  . Years of education: Not on file  . Highest education level: Not on file  Occupational History  . Not on file  Tobacco Use  . Smoking status: Former Research scientist (life sciences)  . Smokeless tobacco: Former Systems developer    Types: Chew  . Tobacco comment: 30 years ago  Vaping Use  . Vaping Use: Never used  Substance and Sexual Activity  . Alcohol use: No  . Drug use: No  . Sexual activity: Not Currently  Other Topics Concern  . Not on file  Social History Narrative  . Not on file   Social Determinants of Health   Financial Resource Strain: Not on file  Food Insecurity: Not on file  Transportation Needs: Not on file  Physical Activity: Not on file  Stress: Not on file  Social Connections: Not on file  Intimate Partner Violence: Not on file     Review of Systems: General: negative for chills, fever, night sweats or weight changes.  Cardiovascular: negative for chest pain, dyspnea on exertion, edema, orthopnea, palpitations, paroxysmal nocturnal dyspnea or shortness of breath Dermatological: negative for rash Respiratory: negative for cough or wheezing Urologic: negative for  hematuria Abdominal: negative for nausea, vomiting, diarrhea, bright red blood per rectum, melena, or hematemesis Neurologic: negative for visual changes, syncope, or dizziness All other systems reviewed and are otherwise negative except as noted above.    Blood pressure 124/78, pulse 62, height _0  (1.727 m), weight 243 lb (110.2 kg).  General appearance: alert and no distress Neck: no adenopathy, no carotid bruit, no JVD, supple, symmetrical, trachea midline and thyroid not enlarged, symmetric, no tenderness/mass/nodules Lungs: clear to auscultation bilaterally Heart: regular rate and rhythm, S1, S2 normal, no murmur, click, rub or gallop Extremities: extremities normal, atraumatic, no cyanosis or edema Pulses: 2+ and symmetric Skin: Skin color, texture, turgor normal. No rashes or lesions Neurologic: Alert and oriented X 3, normal strength and tone. Normal symmetric reflexes. Normal coordination and gait  EKG sinus rhythm at 62  without ST or T wave changes.  I personally reviewed this EKG.  ASSESSMENT AND PLAN:   HLD (hyperlipidemia) History of hyperlipidemia on statin therapy followed by his PCP.  His most recent blood work performed 09/20/2020 revealed total cholesterol 164, LDL of 93 and HDL of 33.  HTN (hypertension) History of essential hypertension blood pressure measured today 124/78.  He is on amlodipine and metoprolol, as well as Diovan and hydrochlorothiazide.  CAD, NATIVE VESSEL (40-50% first diag LAD in 2012) History of CAD status post cardiac catheterization by Dr. Angelena Form in 2012 revealing diagonal branch disease treated medically.  He denies chest pain.  OSA (obstructive sleep apnea) noncompliant w/ CPAP History of obstructive sleep apnea currently not wearing his BiPAP for technical reasons.      Lorretta Harp MD FACP,FACC,FAHA, [facility] 10/16/2020 3:16 PM

## 2020-10-16 NOTE — Assessment & Plan Note (Addendum)
History of hyperlipidemia on statin therapy followed by his PCP.  His most recent blood work performed 09/20/2020 revealed total cholesterol 164, LDL of 93 and HDL of 33.

## 2020-10-28 ENCOUNTER — Ambulatory Visit (INDEPENDENT_AMBULATORY_CARE_PROVIDER_SITE_OTHER): Payer: Commercial Managed Care - PPO | Admitting: Endocrinology

## 2020-10-28 ENCOUNTER — Other Ambulatory Visit: Payer: Self-pay

## 2020-10-28 VITALS — BP 140/86 | HR 68 | Ht 68.0 in | Wt 237.4 lb

## 2020-10-28 DIAGNOSIS — Z794 Long term (current) use of insulin: Secondary | ICD-10-CM

## 2020-10-28 DIAGNOSIS — E119 Type 2 diabetes mellitus without complications: Secondary | ICD-10-CM

## 2020-10-28 LAB — POCT GLYCOSYLATED HEMOGLOBIN (HGB A1C): Hemoglobin A1C: 6.1 % — AB (ref 4.0–5.6)

## 2020-10-28 LAB — HM DIABETES EYE EXAM

## 2020-10-28 NOTE — Progress Notes (Signed)
Subjective:    Patient ID: Grant Williams, male    DOB: 03/15/1956, 65 y.o.   MRN: [id number]  HPI Pt returns for f/u of diabetes mellitus:  DM type: 2. Dx'ed: 2008.   Complications: PN, DN, and CAD.   Therapy: Trulicity and 3 oral meds.     DKA: never.  Severe hypoglycemia: never.   Pancreatitis: never.    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   Other: He took insulin 2015-2018; he had gastric bypass surgery in 2018.   Interval history: pt says cbg's vary from 110-210.  No new sxs.  He takes meds as rx'ed.   Past Medical History:  Diagnosis Date  . Anemia    iron infusions  . Coronary artery disease   . Cough syncope   . Diabetes mellitus without complication ([place])   . Fibromyalgia   . GERD (gastroesophageal reflux disease)   . History of kidney stones   . HTN (hypertension)   . Hyperlipidemia   . Hypertension   . Morbid obesity ([place])   . Pneumonia   . Sleep apnea    dont tolerate cpap  . SOB (shortness of breath)   . Uncontrolled type 2 diabetes mellitus with complication ([place])   . Vitamin B12 deficiency     Past Surgical History:  Procedure Laterality Date  . ELBOW SURGERY Right   . HERNIA REPAIR     left and right inguinal , umbilical also  . ROUX-EN-Y GASTRIC BYPASS     08-2016  something nicked and pt. had to have 3 units of blood  . SHOULDER ARTHROSCOPY Right   . TRANSURETHRAL RESECTION OF PROSTATE N/A 03/14/2019   Procedure: TRANSURETHRAL RESECTION OF THE PROSTATE (TURP);  Surgeon: Irine Seal, MD;  Location: [facility];  Service: Urology;  Laterality: N/A;    Social History   Socioeconomic History  . Marital status: Married    Spouse name: Not on file  . Number of children: Not on file  . Years of education: Not on file  . Highest education level: Not on file  Occupational History  . Not on file  Tobacco Use  . Smoking status: Former Research scientist (life sciences)  . Smokeless tobacco: Former Systems developer    Types: Chew  . Tobacco comment: 30 years  ago  Vaping Use  . Vaping Use: Never used  Substance and Sexual Activity  . Alcohol use: No  . Drug use: No  . Sexual activity: Not Currently  Other Topics Concern  . Not on file  Social History Narrative  . Not on file   Social Determinants of Health   Financial Resource Strain: Not on file  Food Insecurity: Not on file  Transportation Needs: Not on file  Physical Activity: Not on file  Stress: Not on file  Social Connections: Not on file  Intimate Partner Violence: Not on file    Current Outpatient Medications on File Prior to Visit  Medication Sig Dispense Refill  . amLODipine (NORVASC) 10 MG tablet TAKE 1 TABLET BY MOUTH DAILY 60 tablet 0  . ARIPiprazole (ABILIFY) 5 MG tablet Take 5 mg by mouth daily.     . Blood Glucose Monitoring Suppl (ONETOUCH VERIO) w/Device KIT Use 1-4 times daily as needed/directed DX E11.9 1 kit 0  . CALCIUM CITRATE-VITAMIN D PO Take 1 tablet by mouth 2 (two) times daily. 600 + vit d3    . cholecalciferol (VITAMIN D3) 25 MCG (1000 UT) tablet Take 1,000 Units  by mouth 2 (two) times daily.    . clotrimazole-betamethasone (LOTRISONE) cream Apply 1 application topically 2 (two) times daily.    . dapagliflozin propanediol (FARXIGA) 10 MG TABS tablet Take 1 tablet (10 mg total) by mouth daily before breakfast. 90 tablet 3  . docusate sodium (COLACE) 250 MG capsule Take 250-500 mg by mouth See admin instructions. 500 mg in the morning, 250 mg in the evening    . DULoxetine (CYMBALTA) 30 MG capsule Take 30 mg by mouth 3 (three) times daily.     . ergocalciferol (VITAMIN D2) 50000 units capsule Take 50,000 Units by mouth 2 (two) times a week. Mon and Fri    . gabapentin (NEURONTIN) 800 MG tablet Take 800 mg by mouth 4 (four) times daily.     Marland Kitchen ibuprofen (ADVIL) 200 MG tablet Take 600 mg by mouth every 8 (eight) hours as needed (pain).    . iron polysaccharides (NIFEREX) 150 MG capsule Take 1 capsule by mouth 2 (two) times daily.    . Lancets (ONETOUCH  ULTRASOFT) lancets Use 1-4 times daily as needed/directed  DX E11.9 200 each 12  . metFORMIN (GLUCOPHAGE) 500 MG tablet TAKE 2 TABLETS(1000 MG) BY MOUTH TWICE DAILY WITH A MEAL 120 tablet 3  . metoprolol (LOPRESSOR) 100 MG tablet Take 50-100 mg by mouth See admin instructions. 100 mg in the morning, 50 mg at bedtime    . mupirocin ointment (BACTROBAN) 2 % Apply 1 application topically 3 (three) times daily.    Marland Kitchen omeprazole (PRILOSEC) 20 MG capsule Take 20 mg by mouth See admin instructions. Take 1 cap twice daily before taking steroids, and up to 2 weeks after *ONLY IF TAKING ANY STEROIDS    . ONETOUCH VERIO test strip Use 1-4 times daily as directed/needed   DX E11.9 200 each 12  . oxybutynin (DITROPAN-XL) 10 MG 24 hr tablet Take 10 mg by mouth at bedtime.    . pantoprazole (PROTONIX) 40 MG tablet Take 40 mg by mouth daily.     . Pediatric Multiple Vit-C-FA (CHILDRENS CHEWABLE VITAMINS PO) Take 1 tablet by mouth 2 (two) times daily.    . pregabalin (LYRICA) 300 MG capsule Take 300 mg by mouth 2 (two) times daily.     Marland Kitchen rOPINIRole (REQUIP) 1 MG tablet Take 1-2 mg by mouth at bedtime.     . rosuvastatin (CRESTOR) 10 MG tablet Take 10 mg by mouth 3 (three) times a week.    . traZODone (DESYREL) 50 MG tablet Take 50 mg by mouth at bedtime as needed for sleep.    . TRULICITY 4.5 DE/0.8XK SOPN INJECT 4.5 MG INTO THE SKIN EVERY WEEK 6 mL 3  . valsartan-hydrochlorothiazide (DIOVAN-HCT) 320-12.5 MG tablet Take 1 tablet by mouth daily.     No current facility-administered medications on file prior to visit.    Allergies  Allergen Reactions  . Other     Steroids   Does not tolerate bad for stomach has to take med before and after if uses any steroid   . Penicillins Other (See Comments)    Unknown childhood allergic reaction - no other information available    Family History  Problem Relation Age of Onset  . Diabetes Father     BP 140/86 (BP Location: Right Arm, Patient Position: Sitting, Cuff  Size: Large)   Pulse 68   Ht _0  (1.727 m)   Wt 237 lb 6.4 oz (107.7 kg)   SpO2 96%   BMI 36.10 kg/m  Review of Systems He denies hypoglycemia    Objective:   Physical Exam VITAL SIGNS:  See vs page GENERAL: no distress Pulses: dorsalis pedis intact bilat.   MSK: no deformity of the feet CV: 2+ bilat leg edema Skin:  no ulcer on the feet.  normal color and temp on the feet. Neuro: sensation is intact to touch on the feet Ext: there is bilateral onychomycosis of the toenails.    A1c=6.1%     Assessment & Plan:  Type 2 DM: overcontrolled.   Patient Instructions  Please stop taking the glimepiride Please continue the same other diabetes medications.   check your blood sugar once a day.  vary the time of day when you check, between before the 3 meals, and at bedtime.  also check if you have symptoms of your blood sugar being too high or too low.  please keep a record of the readings and bring it to your next appointment here (or you can bring the meter itself).  You can write it on any piece of paper.  please call us sooner if your blood sugar goes below 70, or if you have a lot of readings over 200.  Please come back for a follow-up appointment in 4 months.

## 2020-10-28 NOTE — Patient Instructions (Addendum)
Please stop taking the glimepiride Please continue the same other diabetes medications.   check your blood sugar once a day.  vary the time of day when you check, between before the 3 meals, and at bedtime.  also check if you have symptoms of your blood sugar being too high or too low.  please keep a record of the readings and bring it to your next appointment here (or you can bring the meter itself).  You can write it on any piece of paper.  please call us sooner if your blood sugar goes below 70, or if you have a lot of readings over 200.  Please come back for a follow-up appointment in 4 months.

## 2020-10-29 ENCOUNTER — Ambulatory Visit: Payer: Commercial Managed Care - PPO | Admitting: Endocrinology

## 2020-11-05 ENCOUNTER — Other Ambulatory Visit: Payer: Self-pay | Admitting: Cardiovascular Disease

## 2020-12-09 ENCOUNTER — Other Ambulatory Visit: Payer: Self-pay | Admitting: Endocrinology

## 2021-01-06 ENCOUNTER — Observation Stay (HOSPITAL_BASED_OUTPATIENT_CLINIC_OR_DEPARTMENT_OTHER)
Admission: EM | Admit: 2021-01-06 | Discharge: 2021-01-08 | Disposition: A | Discharge status: Home / Self Care | Payer: Commercial Managed Care - PPO | Attending: Internal Medicine | Admitting: Internal Medicine

## 2021-01-06 ENCOUNTER — Emergency Department (HOSPITAL_BASED_OUTPATIENT_CLINIC_OR_DEPARTMENT_OTHER): Payer: Commercial Managed Care - PPO

## 2021-01-06 ENCOUNTER — Telehealth: Payer: Self-pay | Admitting: Cardiovascular Disease

## 2021-01-06 ENCOUNTER — Other Ambulatory Visit: Payer: Self-pay

## 2021-01-06 ENCOUNTER — Encounter (HOSPITAL_BASED_OUTPATIENT_CLINIC_OR_DEPARTMENT_OTHER): Payer: Self-pay | Admitting: *Deleted

## 2021-01-06 DIAGNOSIS — Z20822 Contact with and (suspected) exposure to covid-19: Secondary | ICD-10-CM | POA: Diagnosis not present

## 2021-01-06 DIAGNOSIS — R0602 Shortness of breath: Secondary | ICD-10-CM | POA: Diagnosis not present

## 2021-01-06 DIAGNOSIS — I2510 Atherosclerotic heart disease of native coronary artery without angina pectoris: Secondary | ICD-10-CM | POA: Insufficient documentation

## 2021-01-06 DIAGNOSIS — K219 Gastro-esophageal reflux disease without esophagitis: Secondary | ICD-10-CM | POA: Diagnosis present

## 2021-01-06 DIAGNOSIS — E860 Dehydration: Secondary | ICD-10-CM | POA: Diagnosis not present

## 2021-01-06 DIAGNOSIS — R531 Weakness: Secondary | ICD-10-CM | POA: Diagnosis present

## 2021-01-06 DIAGNOSIS — K529 Noninfective gastroenteritis and colitis, unspecified: Secondary | ICD-10-CM

## 2021-01-06 DIAGNOSIS — Z7984 Long term (current) use of oral hypoglycemic drugs: Secondary | ICD-10-CM | POA: Insufficient documentation

## 2021-01-06 DIAGNOSIS — E119 Type 2 diabetes mellitus without complications: Secondary | ICD-10-CM | POA: Insufficient documentation

## 2021-01-06 DIAGNOSIS — R197 Diarrhea, unspecified: Secondary | ICD-10-CM | POA: Diagnosis not present

## 2021-01-06 DIAGNOSIS — E1142 Type 2 diabetes mellitus with diabetic polyneuropathy: Secondary | ICD-10-CM

## 2021-01-06 DIAGNOSIS — Z87891 Personal history of nicotine dependence: Secondary | ICD-10-CM | POA: Diagnosis not present

## 2021-01-06 DIAGNOSIS — Z79899 Other long term (current) drug therapy: Secondary | ICD-10-CM | POA: Insufficient documentation

## 2021-01-06 DIAGNOSIS — E1169 Type 2 diabetes mellitus with other specified complication: Secondary | ICD-10-CM | POA: Diagnosis present

## 2021-01-06 DIAGNOSIS — E876 Hypokalemia: Principal | ICD-10-CM | POA: Insufficient documentation

## 2021-01-06 DIAGNOSIS — I10 Essential (primary) hypertension: Secondary | ICD-10-CM | POA: Diagnosis not present

## 2021-01-06 DIAGNOSIS — R079 Chest pain, unspecified: Secondary | ICD-10-CM | POA: Diagnosis present

## 2021-01-06 LAB — URINALYSIS, ROUTINE W REFLEX MICROSCOPIC
Bilirubin Urine: NEGATIVE
Glucose, UA: >1000 mg/dL — AB
Hgb urine dipstick: NEGATIVE
Ketones, ur: 15 mg/dL — AB
Leukocytes,Ua: NEGATIVE
Nitrite: NEGATIVE
Protein, ur: 30 mg/dL — AB
Specific Gravity, Urine: 1.021 (ref 1.005–1.030)
pH: 6 (ref 5.0–8.0)

## 2021-01-06 LAB — BASIC METABOLIC PANEL
Anion gap: 13 (ref 5–15)
BUN: 24 mg/dL — ABNORMAL HIGH (ref 8–23)
CO2: 20 mmol/L — ABNORMAL LOW (ref 22–32)
Calcium: 8.4 mg/dL — ABNORMAL LOW (ref 8.9–10.3)
Chloride: 103 mmol/L (ref 98–111)
Creatinine, Ser: 1.32 mg/dL — ABNORMAL HIGH (ref 0.61–1.24)
GFR, Estimated: 59 mL/min — ABNORMAL LOW (ref >60)
Glucose, Bld: 135 mg/dL — ABNORMAL HIGH (ref 70–99)
Potassium: 2.3 mmol/L — CL (ref 3.5–5.1)
Sodium: 136 mmol/L (ref 135–145)

## 2021-01-06 LAB — CBC
HCT: 48.3 % (ref 39.0–52.0)
Hemoglobin: 16.9 g/dL (ref 13.0–17.0)
MCH: 28.3 pg (ref 26.0–34.0)
MCHC: 35 g/dL (ref 30.0–36.0)
MCV: 80.8 fL (ref 80.0–100.0)
Platelets: 157 10*3/uL (ref 150–400)
RBC: 5.98 MIL/uL — ABNORMAL HIGH (ref 4.22–5.81)
RDW: 15.1 % (ref 11.5–15.5)
WBC: 4 10*3/uL (ref 4.0–10.5)
nRBC: 0 % (ref 0.0–0.2)

## 2021-01-06 LAB — BRAIN NATRIURETIC PEPTIDE: B Natriuretic Peptide: 28.3 pg/mL (ref 0.0–100.0)

## 2021-01-06 LAB — GLUCOSE, CAPILLARY: Glucose-Capillary: 162 mg/dL — ABNORMAL HIGH (ref 70–99)

## 2021-01-06 LAB — CBG MONITORING, ED: Glucose-Capillary: 127 mg/dL — ABNORMAL HIGH (ref 70–99)

## 2021-01-06 LAB — TROPONIN I (HIGH SENSITIVITY)
Troponin I (High Sensitivity): 7 ng/L (ref <18)
Troponin I (High Sensitivity): 7 ng/L (ref <18)

## 2021-01-06 LAB — RESP PANEL BY RT-PCR (FLU A&B, COVID) ARPGX2
Influenza A by PCR: NEGATIVE
Influenza B by PCR: NEGATIVE
SARS Coronavirus 2 by RT PCR: NEGATIVE

## 2021-01-06 LAB — MAGNESIUM: Magnesium: 1.8 mg/dL (ref 1.7–2.4)

## 2021-01-06 MED ORDER — INSULIN ASPART 100 UNIT/ML IJ SOLN
0.0000 [IU] | Freq: Three times a day (TID) | INTRAMUSCULAR | Status: DC
  Administered 2021-01-07 (×2): 3 [IU] via SUBCUTANEOUS
  Administered 2021-01-07: 2 [IU] via SUBCUTANEOUS

## 2021-01-06 MED ORDER — GABAPENTIN 400 MG PO CAPS
800.0000 mg | ORAL_CAPSULE | Freq: Four times a day (QID) | ORAL | Status: DC
  Administered 2021-01-06 – 2021-01-08 (×6): 800 mg via ORAL
  Filled 2021-01-06 (×6): qty 2

## 2021-01-06 MED ORDER — POTASSIUM CHLORIDE CRYS ER 20 MEQ PO TBCR
40.0000 meq | EXTENDED_RELEASE_TABLET | Freq: Once | ORAL | Status: AC
  Administered 2021-01-06: 40 meq via ORAL
  Filled 2021-01-06: qty 2

## 2021-01-06 MED ORDER — POTASSIUM CHLORIDE 10 MEQ/100ML IV SOLN
10.0000 meq | INTRAVENOUS | Status: AC
  Administered 2021-01-06 (×4): 10 meq via INTRAVENOUS
  Filled 2021-01-06 (×4): qty 100

## 2021-01-06 MED ORDER — LACTATED RINGERS IV BOLUS
1000.0000 mL | Freq: Once | INTRAVENOUS | Status: AC
  Administered 2021-01-06: 1000 mL via INTRAVENOUS

## 2021-01-06 MED ORDER — POTASSIUM CHLORIDE CRYS ER 20 MEQ PO TBCR
40.0000 meq | EXTENDED_RELEASE_TABLET | ORAL | Status: DC
  Administered 2021-01-06 (×2): 40 meq via ORAL
  Filled 2021-01-06 (×2): qty 2

## 2021-01-06 NOTE — Telephone Encounter (Signed)
Pt c/o BP issue: STAT if pt c/o blurred vision, one-sided weakness or slurred speech  1. What are your last 5 BP readings? 97/60 sitting pulse 88, 87/58, pulse was 67, 100/64, 87/58 standing pulse was 76i  2. Are you having any other symptoms (ex. Dizziness, headache, blurred vision, passed out)? No energy, had chest pain and pressure on Saturday for about 3 hours - felt like he was going to pass out on Saturday night and Sunday morningi  3. What is your BP issue? Low pressure- patient's wife said she would like for him to be seen

## 2021-01-06 NOTE — ED Notes (Signed)
Called twice attempted to give report to [place].

## 2021-01-06 NOTE — ED Notes (Signed)
ED Provider at bedside. 

## 2021-01-06 NOTE — ED Provider Notes (Signed)
[facility] EMERGENCY DEPT Provider Note   CSN: [id number] Arrival date & time: 01/06/21  1524     History Chief Complaint  Patient presents with   Weakness   Chest Pain    Grant Williams is a 65 y.o. male.  Patient is a 65 year old male with a history of diabetes, hypertension, hyperlipidemia, CAD, gastric bypass in 2018 who is presenting today with several complaints.  Patient reports the thing that brought him in today was severe weakness anytime he would get up and try to walk or do anything he would feel so tired he did not think he could make it even 100 feet.  The more tired he got the more his balance will get worse.  He denies feeling lightheaded or dizzy.  No syncope.  No shortness of breath.  On Saturday 3 days prior to arrival he was mowing his yard and noticed a very tight heavy pain in his chest that he reports was a pressure sensation that was an 8 out of 10 on the pain scale and lasted for 3 to 4 hours while he was working.  He would feel better if he laid down but then when he would get up he would feel very lightheaded and thought he may have even passed out once.  Yesterday he denies having any chest pain and felt like it was an okay day and then today he has been very weak and tired again.  He has not had any chest pain today.  He denies any known weight gain.  He has diarrhea chronically and that has been unchanged.  No cough, congestion or fever.  No medication changes.  He does take metoprolol twice daily and did take it this morning.  He has been eating less than usual but just has not had much of an appetite.  Father with significant cardiac history with MI in his 5s.  Patient reports he had a catheterization he said 10 to 15 years ago and he was told he had a 65% blockage.  No prior stenting.  The history is provided by the patient.  Weakness Associated symptoms: chest pain   Chest Pain Associated symptoms: weakness       Past Medical History:   Diagnosis Date   Anemia    iron infusions   Coronary artery disease    Cough syncope    Diabetes mellitus without complication (HCC)    Fibromyalgia    GERD (gastroesophageal reflux disease)    History of kidney stones    HTN (hypertension)    Hyperlipidemia    Hypertension    Morbid obesity ([place])    Pneumonia    Sleep apnea    dont tolerate cpap   SOB (shortness of breath)    Uncontrolled type 2 diabetes mellitus with complication ([place])    Vitamin B12 deficiency     Patient Active Problem List   Diagnosis Date Noted   DDD (degenerative disc disease), cervical 03/26/2020   BPH with urinary obstruction 03/14/2019   Iron deficiency 12/26/2018   Vitamin D deficiency 12/16/2018   Rotator cuff tendonitis, right 12/07/2018   Iron deficiency anemia 08/31/2017   History of Roux-en-Y gastric bypass 09/10/2016   Lower extremity edema 09/10/2016   Postgastrectomy malabsorption 09/10/2016   Morbid obesity ([place]) 08/25/2016   Former smoker 08/12/2016   Rhabdomyolysis due to statin therapy 03/12/2016   Statin intolerance 03/11/2016   Acute on (presumed) chronic respiratory failure with hypoxia ([place]) 02/08/2016   LVH (left ventricular  hypertrophy) 02/08/2016   Elevated CK 02/08/2016   Fibromyalgia 02/08/2016   History of Cough syncope 02/08/2016   Vitamin B12 deficiency 01/29/2016   Major depression, chronic 04/24/2015   NAFLD (nonalcoholic fatty liver disease) 04/24/2015   Peripheral sensory neuropathy 04/24/2015   Diabetes mellitus type 2, insulin dependent ([place]) 03/06/2014   Carpal tunnel syndrome 01/31/2013   Episodic mood disorder ([place]) 01/03/2013   Paresthesia 01/03/2013   Muscle weakness (generalized) 12/06/2012   Pain in thoracic spine 05/31/2012   Anxiety state 01/18/2012   Thoracic or lumbosacral neuritis or radiculitis, unspecified 09/01/2011   Low back pain 04/27/2011   Peripheral neuropathy 07/09/2010   Absolute anemia 03/06/2010   Other specified inflammatory  polyarthropathies(714.89) 11/20/2009   CAD, NATIVE VESSEL (40-50% first diag LAD in 2012) 08/12/2009   DIZZINESS 08/12/2009   Chest pain 08/12/2009   HLD (hyperlipidemia) 08/07/2009   HTN (hypertension) 08/07/2009   DIVERTICULOSIS, COLON 08/07/2009   OSA (obstructive sleep apnea) noncompliant w/ CPAP 08/07/2009   WEAKNESS 08/07/2009   SWEATING 08/07/2009   RENAL CALCULUS, HX OF 08/07/2009   HEADACHES, HX OF 08/07/2009   Abnormality of gait 07/31/2009   Mild cognitive impairment, so stated 07/31/2009   Depression 11/07/2008   Edema 11/30/2007   Pain in joint, upper arm 11/30/2007   Tobacco use disorder 02/26/2007   Insomnia, transient 07/03/2006   Other abnormality of red blood cells 05/24/2006    Past Surgical History:  Procedure Laterality Date   ELBOW SURGERY Right    HERNIA REPAIR     left and right inguinal , umbilical also   ROUX-EN-Y GASTRIC BYPASS     08-2016  something nicked and pt. had to have 3 units of blood   SHOULDER ARTHROSCOPY Right    TRANSURETHRAL RESECTION OF PROSTATE N/A 03/14/2019   Procedure: TRANSURETHRAL RESECTION OF THE PROSTATE (TURP);  Surgeon: Irine Seal, MD;  Location: [facility];  Service: Urology;  Laterality: N/A;       Family History  Problem Relation Age of Onset   Diabetes Father     Social History   Tobacco Use   Smoking status: Former   Smokeless tobacco: Former    Types: Chew   Tobacco comments:    30 years ago  Vaping Use   Vaping Use: Never used  Substance Use Topics   Alcohol use: No   Drug use: No    Home Medications Prior to Admission medications   Medication Sig Start Date End Date Taking? Authorizing Provider  amLODipine (NORVASC) 10 MG tablet TAKE 1 TABLET BY MOUTH DAILY 11/05/20   Lorretta Harp, MD  ARIPiprazole (ABILIFY) 5 MG tablet Take 5 mg by mouth daily.  02/11/14   [provider]  Blood Glucose Monitoring Suppl (ONETOUCH VERIO) w/Device KIT Use 1-4 times daily as needed/directed DX E11.9 03/28/20    Renato Shin, MD  CALCIUM CITRATE-VITAMIN D PO Take 1 tablet by mouth 2 (two) times daily. 600 + vit d3    [provider]  cholecalciferol (VITAMIN D3) 25 MCG (1000 UT) tablet Take 1,000 Units by mouth 2 (two) times daily.    [provider]  clotrimazole-betamethasone (LOTRISONE) cream Apply 1 application topically 2 (two) times daily.    [provider]  dapagliflozin propanediol (FARXIGA) 10 MG TABS tablet Take 1 tablet (10 mg total) by mouth daily before breakfast. 06/28/20   Renato Shin, MD  docusate sodium (COLACE) 250 MG capsule Take 250-500 mg by mouth See admin instructions. 500 mg in the morning,  250 mg in the evening    [provider]  DULoxetine (CYMBALTA) 30 MG capsule Take 30 mg by mouth 3 (three) times daily.  02/21/14   [provider]  ergocalciferol (VITAMIN D2) 50000 units capsule Take 50,000 Units by mouth 2 (two) times a week. Mon and Fri    [provider]  gabapentin (NEURONTIN) 800 MG tablet Take 800 mg by mouth 4 (four) times daily.     [provider]  ibuprofen (ADVIL) 200 MG tablet Take 600 mg by mouth every 8 (eight) hours as needed (pain).    [provider]  iron polysaccharides (NIFEREX) 150 MG capsule Take 1 capsule by mouth 2 (two) times daily. 08/31/17   [provider]  Lancets [facility] ULTRASOFT) lancets Use 1-4 times daily as needed/directed  DX E11.9 03/28/20   Renato Shin, MD  metFORMIN (GLUCOPHAGE) 500 MG tablet TAKE 2 TABLETS(1000 MG) BY MOUTH TWICE DAILY WITH A MEAL 10/08/20   Renato Shin, MD  metoprolol (LOPRESSOR) 100 MG tablet Take 50-100 mg by mouth See admin instructions. 100 mg in the morning, 50 mg at bedtime    [provider]  mupirocin ointment (BACTROBAN) 2 % Apply 1 application topically 3 (three) times daily.    [provider]  omeprazole (PRILOSEC) 20 MG capsule Take 20 mg by mouth See admin instructions. Take 1 cap twice daily before taking  steroids, and up to 2 weeks after *ONLY IF TAKING ANY STEROIDS    [provider]  [facility] VERIO test strip Use 1-4 times daily as directed/needed   DX E11.9 03/28/20   Renato Shin, MD  oxybutynin (DITROPAN-XL) 10 MG 24 hr tablet Take 10 mg by mouth at bedtime.    [provider]  pantoprazole (PROTONIX) 40 MG tablet Take 40 mg by mouth daily.  02/19/14   [provider]  Pediatric Multiple Vit-C-FA (CHILDRENS CHEWABLE VITAMINS PO) Take 1 tablet by mouth 2 (two) times daily.    [provider]  pregabalin (LYRICA) 300 MG capsule Take 300 mg by mouth 2 (two) times daily.     [provider]  rOPINIRole (REQUIP) 1 MG tablet Take 1-2 mg by mouth at bedtime.  03/03/14   [provider]  rosuvastatin (CRESTOR) 10 MG tablet Take 10 mg by mouth 3 (three) times a week.    [provider]  traZODone (DESYREL) 50 MG tablet Take 50 mg by mouth at bedtime as needed for sleep.    [provider]  TRULICITY 4.5 XN/1.7GY SOPN INJECT 4.5 MG INTO THE SKIN EVERY WEEK 07/20/20   Renato Shin, MD  valsartan-hydrochlorothiazide (DIOVAN-HCT) 320-12.5 MG tablet Take 1 tablet by mouth daily. 06/30/17   [provider]    Allergies    Other and Penicillins  Review of Systems   Review of Systems  Cardiovascular:  Positive for chest pain.  Neurological:  Positive for weakness.  All other systems reviewed and are negative.  Physical Exam Updated Vital Signs BP 128/67 (BP Location: Right Arm)   Pulse 78   Temp 98 F (36.7 C) (Oral)   Resp 18   Ht _0  (1.753 m)   Wt 93.4 kg   SpO2 97%   BMI 30.42 kg/m   Physical Exam Vitals and nursing note reviewed.  Constitutional:      General: He is not in acute distress.    Appearance: He is well-developed.  HENT:     Head: Normocephalic and atraumatic.  Mouth/Throat:     Mouth: Mucous membranes are moist.  Eyes:     Conjunctiva/sclera: Conjunctivae normal.     Pupils: Pupils are  equal, round, and reactive to light.  Cardiovascular:     Rate and Rhythm: Normal rate and regular rhythm.     Heart sounds: No murmur heard. Pulmonary:     Effort: Pulmonary effort is normal. No respiratory distress.     Breath sounds: Normal breath sounds. No wheezing or rales.  Abdominal:     General: There is no distension.     Palpations: Abdomen is soft.     Tenderness: There is no abdominal tenderness. There is no guarding or rebound.  Musculoskeletal:        General: No tenderness. Normal range of motion.     Cervical back: Normal range of motion and neck supple.     Right lower leg: No edema.     Left lower leg: No edema.  Skin:    General: Skin is warm and dry.     Findings: No erythema or rash.  Neurological:     Mental Status: He is alert and oriented to person, place, and time. Mental status is at baseline.  Psychiatric:        Mood and Affect: Mood normal.        Behavior: Behavior normal.    ED Results / Procedures / Treatments   Labs (all labs ordered are listed, but only abnormal results are displayed) Labs Reviewed  BASIC METABOLIC PANEL - Abnormal; Notable for the following components:      Result Value   Potassium 2.3 (*)    CO2 20 (*)    Glucose, Bld 135 (*)    BUN 24 (*)    Creatinine, Ser 1.32 (*)    Calcium 8.4 (*)    GFR, Estimated 59 (*)    All other components within normal limits  CBC - Abnormal; Notable for the following components:   RBC 5.98 (*)    All other components within normal limits  URINALYSIS, ROUTINE W REFLEX MICROSCOPIC - Abnormal; Notable for the following components:   Glucose, UA >1,000 (*)    Ketones, ur 15 (*)    Protein, ur 30 (*)    All other components within normal limits  GLUCOSE, CAPILLARY - Abnormal; Notable for the following components:   Glucose-Capillary 162 (*)    All other components within normal limits  CBG MONITORING, ED - Abnormal; Notable for the following components:   Glucose-Capillary 127 (*)     All other components within normal limits  RESP PANEL BY RT-PCR (FLU A&B, COVID) ARPGX2  BRAIN NATRIURETIC PEPTIDE  MAGNESIUM  HEMOGLOBIN A1C  TROPONIN I (HIGH SENSITIVITY)  TROPONIN I (HIGH SENSITIVITY)    EKG EKG Interpretation  Date/Time:  Monday January 06 2021 15:44:57 EDT Ventricular Rate:  82 PR Interval:  194 QRS Duration: 104 QT Interval:  380 QTC Calculation: 443 R Axis:   -62 Text Interpretation: Normal sinus rhythm Left axis deviation Possible Anterior infarct , age undetermined No significant change since last tracing Confirmed by Blanchie Dessert [phone]) on 01/06/2021 3:59:31 PM  Radiology No results found.  Procedures Procedures   Medications Ordered in ED Medications - No data to display  ED Course  I have reviewed the triage vital signs and the nursing notes.  Pertinent labs & imaging results that were available during my care of the patient were reviewed by me and considered in my medical decision making (see chart  for details).    MDM Rules/Calculators/A&P                          65 year old gentleman presenting today with multiple complaints.  Today's complaint is just of feeling extremely weak when he tries to do anything.  However he had a episode of chest pain 3 days ago which was concerning for possible ACS in the setting of history of hypertension diabetes and catheterization years ago that showed a 65% blockage.  He is not having any chest pain at this time.  EKG does not show significant changes.  Patient's vital signs currently appear normal but he reports low blood pressure with standing.  He is also had poor intake and has regular diarrhea.  Other than ACS concern for possible electrolyte abnormality, dehydration.  Will give patient IV fluids.  Labs and imaging are pending.  Patient does not have evidence of fluid overload at this time with clear breath sounds and no distal edema.  12:16 AM Patient's labs are consistent with hypokalemia with a  potassium of 2.3, normal troponin, normal magnesium, increase in creatinine.  Concerned that the hypokalemia may be the cause of patient's weakness today and he was given IV and oral potassium replacement as well as IV fluids.  Will admit for further care.  No findings at this time to suggest ACS.  MDM   Amount and/or Complexity of Data Reviewed Clinical lab tests: ordered and reviewed Tests in the medicine section of CPT: ordered and reviewed Decide to obtain previous medical records or to obtain history from someone other than the patient: yes Obtain history from someone other than the patient: yes Review and summarize past medical records: yes Discuss the patient with other providers: yes Independent visualization of images, tracings, or specimens: yes  Risk of Complications, Morbidity, and/or Mortality Presenting problems: moderate Diagnostic procedures: moderate Management options: moderate  Patient Progress Patient progress: stable  CRITICAL CARE Performed by: Ediel Unangst Total critical care time: 30 minutes Critical care time was exclusive of separately billable procedures and treating other patients. Critical care was necessary to treat or prevent imminent or life-threatening deterioration. Critical care was time spent personally by me on the following activities: development of treatment plan with patient and/or surrogate as well as nursing, discussions with consultants, evaluation of patient's response to treatment, examination of patient, obtaining history from patient or surrogate, ordering and performing treatments and interventions, ordering and review of laboratory studies, ordering and review of radiographic studies, pulse oximetry and re-evaluation of patient's condition.   Final Clinical Impression(s) / ED Diagnoses Final diagnoses:  Hypokalemia  Nonspecific chest pain  Dehydration  Chronic diarrhea    Rx / DC Orders ED Discharge Orders     None         Blanchie Dessert, MD 01/07/21 0017

## 2021-01-06 NOTE — ED Triage Notes (Addendum)
Pt states he had chest pain, weakness, diarrhea. Weakness and feeling pf prssure on chest has continued "Feels like a 50# baby on chest" at present time. Pt states his 02 was around 80% RA and his pressures have been low. In triage bp 128/67 Sats 98% RA

## 2021-01-06 NOTE — Plan of Care (Signed)
65 year old M with PMH of CAD, HTN, HLD, OSA, DM-2, diastolic CHF, anxiety, depression, fibromyalgia, BPH, DDD, Roux-en-Y and chronic diarrhea presenting with generalized weakness for 2 days.  Had chest pain 3 days prior.  No cardiopulmonary symptoms at the moment.  In ED, vitals within normal.  Normal saturation. Cr/BUN 1.23/24 (baseline 0.9).  K2.3.  High-sensitivity troponin 7.  BNP 28.  CBC, CXR and EKG basically normal.  COVID-19 PCR screen pending.  40 mEq of IV KCl and p.o. K-Dur ordered.  Hospitalist service called for admission for generalized weakness and hypokalemia.  Accepted to telemetry bed for observation.  Requested EDP to check magnesium as well.

## 2021-01-06 NOTE — Telephone Encounter (Signed)
Called and spoke to patient and patients wife. She states that patient has been feeling awful the last few days, being very weak, pale, and feeling like he could pass out. She states his BP has been low- the most recent BP was 97/60 HR 88, patient wife states they had a pulse ox on him and his oxygen level was in the 80's. I advised with them to go to the ED with that reading, and BP reading. He denies current chest pressure. He did mention he was trying to work today and something that would normally take him 30 minutes, took him a few hours because of having to stop to rest. Wife will take him to the ED now, I advised I would route to MD to advise.

## 2021-01-07 ENCOUNTER — Observation Stay (HOSPITAL_BASED_OUTPATIENT_CLINIC_OR_DEPARTMENT_OTHER): Payer: Commercial Managed Care - PPO

## 2021-01-07 ENCOUNTER — Encounter (HOSPITAL_COMMUNITY): Payer: Self-pay | Admitting: Student

## 2021-01-07 DIAGNOSIS — R079 Chest pain, unspecified: Secondary | ICD-10-CM

## 2021-01-07 DIAGNOSIS — I25119 Atherosclerotic heart disease of native coronary artery with unspecified angina pectoris: Secondary | ICD-10-CM | POA: Diagnosis not present

## 2021-01-07 DIAGNOSIS — R531 Weakness: Secondary | ICD-10-CM

## 2021-01-07 DIAGNOSIS — E1142 Type 2 diabetes mellitus with diabetic polyneuropathy: Secondary | ICD-10-CM

## 2021-01-07 DIAGNOSIS — R197 Diarrhea, unspecified: Secondary | ICD-10-CM

## 2021-01-07 DIAGNOSIS — E876 Hypokalemia: Secondary | ICD-10-CM | POA: Diagnosis not present

## 2021-01-07 DIAGNOSIS — K219 Gastro-esophageal reflux disease without esophagitis: Secondary | ICD-10-CM

## 2021-01-07 DIAGNOSIS — E1169 Type 2 diabetes mellitus with other specified complication: Secondary | ICD-10-CM

## 2021-01-07 DIAGNOSIS — I10 Essential (primary) hypertension: Secondary | ICD-10-CM

## 2021-01-07 DIAGNOSIS — E782 Mixed hyperlipidemia: Secondary | ICD-10-CM

## 2021-01-07 LAB — COMPREHENSIVE METABOLIC PANEL
ALT: 23 U/L (ref 0–44)
AST: 15 U/L (ref 15–41)
Albumin: 3.3 g/dL — ABNORMAL LOW (ref 3.5–5.0)
Alkaline Phosphatase: 43 U/L (ref 38–126)
Anion gap: 12 (ref 5–15)
BUN: 20 mg/dL (ref 8–23)
CO2: 17 mmol/L — ABNORMAL LOW (ref 22–32)
Calcium: 8.2 mg/dL — ABNORMAL LOW (ref 8.9–10.3)
Chloride: 110 mmol/L (ref 98–111)
Creatinine, Ser: 1.02 mg/dL (ref 0.61–1.24)
GFR, Estimated: >60 mL/min (ref >60)
Glucose, Bld: 159 mg/dL — ABNORMAL HIGH (ref 70–99)
Potassium: 2.7 mmol/L — CL (ref 3.5–5.1)
Sodium: 139 mmol/L (ref 135–145)
Total Bilirubin: 1 mg/dL (ref 0.3–1.2)
Total Protein: 5.7 g/dL — ABNORMAL LOW (ref 6.5–8.1)

## 2021-01-07 LAB — LIPID PANEL
Cholesterol: 79 mg/dL (ref 0–200)
HDL: 25 mg/dL — ABNORMAL LOW (ref >40)
LDL Cholesterol: 25 mg/dL (ref 0–99)
Total CHOL/HDL Ratio: 3.2 RATIO
Triglycerides: 143 mg/dL (ref <150)
VLDL: 29 mg/dL (ref 0–40)

## 2021-01-07 LAB — ECHOCARDIOGRAM COMPLETE
AR max vel: 2.34 cm2
AV Area VTI: 2.49 cm2
AV Area mean vel: 2.44 cm2
AV Mean grad: 6 mmHg
AV Peak grad: 12.3 mmHg
Ao pk vel: 1.75 m/s
Area-P 1/2: 2.93 cm2
Calc EF: 65.8 %
Height: 69 in
S' Lateral: 3.1 cm
Single Plane A2C EF: 71.8 %
Single Plane A4C EF: 68.7 %
Weight: 3296 oz

## 2021-01-07 LAB — MAGNESIUM: Magnesium: 1.9 mg/dL (ref 1.7–2.4)

## 2021-01-07 LAB — TROPONIN I (HIGH SENSITIVITY)
Troponin I (High Sensitivity): 6 ng/L (ref <18)
Troponin I (High Sensitivity): 7 ng/L (ref <18)
Troponin I (High Sensitivity): 6 ng/L (ref <18)

## 2021-01-07 LAB — CBC WITH DIFFERENTIAL/PLATELET
Abs Immature Granulocytes: 0.03 10*3/uL (ref 0.00–0.07)
Basophils Absolute: 0.1 10*3/uL (ref 0.0–0.1)
Basophils Relative: 2 %
Eosinophils Absolute: 0.2 10*3/uL (ref 0.0–0.5)
Eosinophils Relative: 4 %
HCT: 45.1 % (ref 39.0–52.0)
Hemoglobin: 15.6 g/dL (ref 13.0–17.0)
Immature Granulocytes: 1 %
Lymphocytes Relative: 25 %
Lymphs Abs: 1 10*3/uL (ref 0.7–4.0)
MCH: 28.5 pg (ref 26.0–34.0)
MCHC: 34.6 g/dL (ref 30.0–36.0)
MCV: 82.4 fL (ref 80.0–100.0)
Monocytes Absolute: 0.7 10*3/uL (ref 0.1–1.0)
Monocytes Relative: 17 %
Neutro Abs: 2.1 10*3/uL (ref 1.7–7.7)
Neutrophils Relative %: 51 %
Platelets: 126 10*3/uL — ABNORMAL LOW (ref 150–400)
RBC: 5.47 MIL/uL (ref 4.22–5.81)
RDW: 15.1 % (ref 11.5–15.5)
WBC: 4.1 10*3/uL (ref 4.0–10.5)
nRBC: 0 % (ref 0.0–0.2)

## 2021-01-07 LAB — GLUCOSE, CAPILLARY
Glucose-Capillary: 153 mg/dL — ABNORMAL HIGH (ref 70–99)
Glucose-Capillary: 144 mg/dL — ABNORMAL HIGH (ref 70–99)
Glucose-Capillary: 114 mg/dL — ABNORMAL HIGH (ref 70–99)
Glucose-Capillary: 161 mg/dL — ABNORMAL HIGH (ref 70–99)

## 2021-01-07 LAB — HEMOGLOBIN A1C
Hgb A1c MFr Bld: 6.3 % — ABNORMAL HIGH (ref 4.8–5.6)
Mean Plasma Glucose: 134.11 mg/dL

## 2021-01-07 LAB — HIV ANTIBODY (ROUTINE TESTING W REFLEX): HIV Screen 4th Generation wRfx: NONREACTIVE

## 2021-01-07 MED ORDER — PANTOPRAZOLE SODIUM 40 MG PO TBEC
40.0000 mg | DELAYED_RELEASE_TABLET | Freq: Every day | ORAL | Status: DC
  Administered 2021-01-07 – 2021-01-08 (×2): 40 mg via ORAL
  Filled 2021-01-07 (×2): qty 1

## 2021-01-07 MED ORDER — TRAZODONE HCL 50 MG PO TABS: 50.0000 mg | ORAL_TABLET | Freq: Every evening | ORAL | Status: DC | PRN

## 2021-01-07 MED ORDER — MAGNESIUM SULFATE IN D5W 1-5 GM/100ML-% IV SOLN
1.0000 g | Freq: Once | INTRAVENOUS | Status: AC
  Administered 2021-01-07: 1 g via INTRAVENOUS
  Filled 2021-01-07: qty 100

## 2021-01-07 MED ORDER — ONDANSETRON HCL 4 MG/2ML IJ SOLN: 4.0000 mg | Freq: Four times a day (QID) | INTRAMUSCULAR | Status: DC | PRN

## 2021-01-07 MED ORDER — OXYBUTYNIN CHLORIDE ER 5 MG PO TB24
10.0000 mg | ORAL_TABLET | Freq: Every day | ORAL | Status: DC
  Administered 2021-01-07: 10 mg via ORAL
  Filled 2021-01-07: qty 2

## 2021-01-07 MED ORDER — LACTATED RINGERS IV SOLN: INTRAVENOUS | Status: AC

## 2021-01-07 MED ORDER — AMLODIPINE BESYLATE 10 MG PO TABS
10.0000 mg | ORAL_TABLET | Freq: Every day | ORAL | Status: DC
  Administered 2021-01-07 – 2021-01-08 (×2): 10 mg via ORAL
  Filled 2021-01-07 (×2): qty 1

## 2021-01-07 MED ORDER — DOCUSATE SODIUM 50 MG PO CAPS: 250.0000 mg | ORAL_CAPSULE | Freq: Every day | ORAL | Status: DC

## 2021-01-07 MED ORDER — POLYETHYLENE GLYCOL 3350 17 G PO PACK: 17.0000 g | PACK | Freq: Every day | ORAL | Status: DC | PRN

## 2021-01-07 MED ORDER — METOPROLOL TARTRATE 50 MG PO TABS
50.0000 mg | ORAL_TABLET | Freq: Every day | ORAL | Status: DC
  Administered 2021-01-07: 50 mg via ORAL
  Filled 2021-01-07: qty 1

## 2021-01-07 MED ORDER — NORTRIPTYLINE HCL 10 MG PO CAPS
30.0000 mg | ORAL_CAPSULE | Freq: Every day | ORAL | Status: DC
  Administered 2021-01-07: 30 mg via ORAL
  Filled 2021-01-07: qty 3

## 2021-01-07 MED ORDER — ASPIRIN EC 81 MG PO TBEC
81.0000 mg | DELAYED_RELEASE_TABLET | Freq: Every day | ORAL | Status: DC
  Administered 2021-01-07 – 2021-01-08 (×2): 81 mg via ORAL
  Filled 2021-01-07 (×2): qty 1

## 2021-01-07 MED ORDER — POTASSIUM CHLORIDE CRYS ER 20 MEQ PO TBCR
40.0000 meq | EXTENDED_RELEASE_TABLET | ORAL | Status: AC
  Administered 2021-01-07 (×3): 40 meq via ORAL
  Filled 2021-01-07 (×3): qty 2

## 2021-01-07 MED ORDER — DOCUSATE SODIUM 100 MG PO CAPS: 500.0000 mg | ORAL_CAPSULE | Freq: Every day | ORAL | Status: DC

## 2021-01-07 MED ORDER — POTASSIUM CHLORIDE CRYS ER 20 MEQ PO TBCR: 40.0000 meq | EXTENDED_RELEASE_TABLET | Freq: Two times a day (BID) | ORAL | Status: DC

## 2021-01-07 MED ORDER — METOPROLOL TARTRATE 50 MG PO TABS
100.0000 mg | ORAL_TABLET | Freq: Every day | ORAL | Status: DC
  Administered 2021-01-07 – 2021-01-08 (×2): 100 mg via ORAL
  Filled 2021-01-07 (×2): qty 2

## 2021-01-07 MED ORDER — LACTATED RINGERS IV BOLUS
1000.0000 mL | Freq: Once | INTRAVENOUS | Status: AC
  Administered 2021-01-07: 1000 mL via INTRAVENOUS

## 2021-01-07 MED ORDER — PREGABALIN 75 MG PO CAPS
300.0000 mg | ORAL_CAPSULE | Freq: Two times a day (BID) | ORAL | Status: DC
  Administered 2021-01-07 – 2021-01-08 (×3): 300 mg via ORAL
  Filled 2021-01-07 (×4): qty 4

## 2021-01-07 MED ORDER — ROSUVASTATIN CALCIUM 10 MG PO TABS
10.0000 mg | ORAL_TABLET | ORAL | Status: DC
  Administered 2021-01-08: 10 mg via ORAL
  Filled 2021-01-07: qty 1

## 2021-01-07 MED ORDER — ACETAMINOPHEN 325 MG PO TABS
650.0000 mg | ORAL_TABLET | ORAL | Status: DC | PRN
Start: 2021-01-07 — End: 2021-01-08
  Administered 2021-01-07 (×2): 650 mg via ORAL
  Filled 2021-01-07 (×2): qty 2

## 2021-01-07 MED ORDER — ENOXAPARIN SODIUM 40 MG/0.4ML IJ SOSY
40.0000 mg | PREFILLED_SYRINGE | INTRAMUSCULAR | Status: DC
  Administered 2021-01-07 – 2021-01-08 (×2): 40 mg via SUBCUTANEOUS
  Filled 2021-01-07 (×2): qty 0.4

## 2021-01-07 NOTE — Progress Notes (Signed)
*  PRELIMINARY RESULTS* Echocardiogram 2D Echocardiogram has been performed.  Neomia Dear RDCS 01/07/2021, 9:09 AM

## 2021-01-07 NOTE — Progress Notes (Signed)
Patient seen and examined.  Admitted early morning hours by nighttime hospitalist.  He does have history of nonobstructive coronary artery disease, taken off aspirin after gastric bypass surgery in 2019.  He does have chronic diarrhea since then.  Developed worsening diarrhea for the last few weeks and extreme weakness and shortness of breath.  Found to have very low potassium of 2.3 on presentation.  Troponins and EKG nonischemic.  Replace potassium and magnesium aggressively.  Recheck levels tomorrow morning.  Check phosphorus.  Diarrhea is self-limited and already improving.  No evidence of acute coronary syndrome.  Go back on aspirin.  2D echocardiogram has been ordered at night, pending.  If any obvious wall motion abnormalities, will need ischemia evaluation otherwise we will adjust his medications and suggest follow-up with cardiology as outpatient.  Mobilize in the hallway. Anticipate discharge home tomorrow morning.

## 2021-01-07 NOTE — H&P (Signed)
History and Physical    Grant Williams [id number] DOB: 07/11/1955 DOA: 01/06/2021  PCP: Vicenta Aly, FNP  Patient coming from: [facility]    Chief Complaint:  Chief Complaint  Patient presents with   Weakness   Chest Pain     HPI:    65 year old male with past medical history of gastric bypass, BPH  (S/P TURP 02/2019), coronary artery disease (cath 2012 without PCI, medical management), hyperlipidemia, hypertension, obesity, diabetes mellitus type 2, gastroesophageal reflux disease who presents to [facility] long hospital as a transfer from med center [place] department due to generalized weakness.  Patient explains that he has always had chronic loose stools since his gastric bypass on July 14 to 15 experience sudden onset of severe watery diarrhea.  Patient states that he moved his bowels so many times that "I could not even count them."  By Saturday, patient's diarrhea spontaneously resolved the patient developed generalized weakness and malaise in the aftermath.  Patient decided to do some yard work that day and therefore was mowing the lawn.  Patient explains that while he was mowing the lawn began to experience chest discomfort.  Patient describes this chest discomfort as midsternal in location, nonradiating, pressure-like in quality and moderate in intensity. Patient experiencing chest discomfort continue to work in the yard.  Then began to work on another lawnmower in his garage and noticed that every time he would stand up he felt extremely lightheaded and about to pass out.  Patient's chest discomfort had continued at this point.  After several occurrences of this patient decided to go inside to get some rest.  Patient explains that after going inside and lying down his chest discomfort finally resolved.  On Sunday and Monday patient continued to experience intense generalized weakness that was unrelenting.  This weakness was so severe by Monday that he decided to  present to [place] emergency department for evaluation.  Upon evaluation in the emergency department, clinically patient was felt to be volume depleted.  Patient was found to have a markedly low potassium of 2.3.  Patient was placed on intravenous and oral potassium chloride for replacement.  Patient was hydrated with intravenous isotonic fluids.  Due to persisting severe generalized weakness and substantial hypokalemia the hospitalist group was called and the patient was accepted for transfer to [facility] long hospital for continued management.  Review of Systems:   Review of Systems  Constitutional:  Positive for malaise/fatigue.  Cardiovascular:  Positive for chest pain.  Gastrointestinal:  Positive for diarrhea.  Neurological:  Positive for dizziness and weakness.  All other systems reviewed and are negative.  Past Medical History:  Diagnosis Date   Anemia    iron infusions   Coronary artery disease    Cough syncope    Diabetes mellitus without complication (HCC)    Fibromyalgia    GERD (gastroesophageal reflux disease)    History of kidney stones    HTN (hypertension)    Hyperlipidemia    Hypertension    Morbid obesity (HCC)    Pneumonia    Sleep apnea    dont tolerate cpap   SOB (shortness of breath)    Uncontrolled type 2 diabetes mellitus with complication ([place])    Vitamin B12 deficiency     Past Surgical History:  Procedure Laterality Date   ELBOW SURGERY Right    HERNIA REPAIR     left and right inguinal , umbilical also   ROUX-EN-Y GASTRIC BYPASS     08-2016  something nicked and pt. had to have 3 units of blood   SHOULDER ARTHROSCOPY Right    TRANSURETHRAL RESECTION OF PROSTATE N/A 03/14/2019   Procedure: TRANSURETHRAL RESECTION OF THE PROSTATE (TURP);  Surgeon: Irine Seal, MD;  Location: [facility];  Service: Urology;  Laterality: N/A;     reports that he has quit smoking. He has quit using smokeless tobacco.  His smokeless tobacco use included chew.  He reports that he does not drink alcohol and does not use drugs.  Allergies  Allergen Reactions   Other     Steroids   Does not tolerate bad for stomach has to take med before and after if uses any steroid    Penicillins Other (See Comments)    Unknown childhood allergic reaction - no other information available    Family History  Problem Relation Age of Onset   Diabetes Father      Prior to Admission medications   Medication Sig Start Date End Date Taking? Authorizing Provider  amLODipine (NORVASC) 10 MG tablet TAKE 1 TABLET BY MOUTH DAILY 11/05/20   Lorretta Harp, MD  ARIPiprazole (ABILIFY) 5 MG tablet Take 5 mg by mouth daily.  02/11/14   [provider]  Blood Glucose Monitoring Suppl (ONETOUCH VERIO) w/Device KIT Use 1-4 times daily as needed/directed DX E11.9 03/28/20   Renato Shin, MD  CALCIUM CITRATE-VITAMIN D PO Take 1 tablet by mouth 2 (two) times daily. 600 + vit d3    [provider]  cholecalciferol (VITAMIN D3) 25 MCG (1000 UT) tablet Take 1,000 Units by mouth 2 (two) times daily.    [provider]  clotrimazole-betamethasone (LOTRISONE) cream Apply 1 application topically 2 (two) times daily.    [provider]  dapagliflozin propanediol (FARXIGA) 10 MG TABS tablet Take 1 tablet (10 mg total) by mouth daily before breakfast. 06/28/20   Renato Shin, MD  docusate sodium (COLACE) 250 MG capsule Take 250-500 mg by mouth See admin instructions. 500 mg in the morning, 250 mg in the evening    [provider]  DULoxetine (CYMBALTA) 30 MG capsule Take 30 mg by mouth 3 (three) times daily.  02/21/14   [provider]  ergocalciferol (VITAMIN D2) 50000 units capsule Take 50,000 Units by mouth 2 (two) times a week. Mon and Fri    [provider]  gabapentin (NEURONTIN) 800 MG tablet Take 800 mg by mouth 4 (four) times daily.     [provider]  ibuprofen (ADVIL) 200 MG tablet Take 600 mg by mouth every 8  (eight) hours as needed (pain).    [provider]  iron polysaccharides (NIFEREX) 150 MG capsule Take 1 capsule by mouth 2 (two) times daily. 08/31/17   [provider]  Lancets [facility] ULTRASOFT) lancets Use 1-4 times daily as needed/directed  DX E11.9 03/28/20   Renato Shin, MD  metFORMIN (GLUCOPHAGE) 500 MG tablet TAKE 2 TABLETS(1000 MG) BY MOUTH TWICE DAILY WITH A MEAL 10/08/20   Renato Shin, MD  metoprolol (LOPRESSOR) 100 MG tablet Take 50-100 mg by mouth See admin instructions. 100 mg in the morning, 50 mg at bedtime    [provider]  mupirocin ointment (BACTROBAN) 2 % Apply 1 application topically 3 (three) times daily.    [provider]  omeprazole (PRILOSEC) 20 MG capsule Take 20 mg by mouth See admin instructions. Take 1 cap twice daily before taking steroids, and up to 2 weeks after *ONLY IF TAKING ANY STEROIDS  [provider]  [facility] VERIO test strip Use 1-4 times daily as directed/needed   DX E11.9 03/28/20   Renato Shin, MD  oxybutynin (DITROPAN-XL) 10 MG 24 hr tablet Take 10 mg by mouth at bedtime.    [provider]  pantoprazole (PROTONIX) 40 MG tablet Take 40 mg by mouth daily.  02/19/14   [provider]  Pediatric Multiple Vit-C-FA (CHILDRENS CHEWABLE VITAMINS PO) Take 1 tablet by mouth 2 (two) times daily.    [provider]  pregabalin (LYRICA) 300 MG capsule Take 300 mg by mouth 2 (two) times daily.     [provider]  rOPINIRole (REQUIP) 1 MG tablet Take 1-2 mg by mouth at bedtime.  03/03/14   [provider]  rosuvastatin (CRESTOR) 10 MG tablet Take 10 mg by mouth 3 (three) times a week.    [provider]  traZODone (DESYREL) 50 MG tablet Take 50 mg by mouth at bedtime as needed for sleep.    [provider]  TRULICITY 4.5 OE/3.2ZY SOPN INJECT 4.5 MG INTO THE SKIN EVERY WEEK 07/20/20   Renato Shin, MD  valsartan-hydrochlorothiazide (DIOVAN-HCT) 320-12.5 MG  tablet Take 1 tablet by mouth daily. 06/30/17   [provider]    Physical Exam: Vitals:   01/06/21 1930 01/06/21 2100 01/06/21 2224 01/07/21 0204  BP: 127/65 131/76 121/80 132/73  Pulse: 88 84 79 90  Resp: _0 Temp:   99.8 F (37.7 C) 98.3 F (36.8 C)  TempSrc:   Oral Oral  SpO2: 97% 98% 94% 95%  Weight:      Height:        Constitutional: Awake alert and oriented x3, no associated distress.   Skin: no rashes, no lesions, poor skin turgor noted.. Eyes: Pupils are equally reactive to light.  No evidence of scleral icterus or conjunctival pallor.  ENMT: Extremely dry mucous membranes noted.  Posterior pharynx clear of any exudate or lesions.   Neck: normal, supple, no masses, no thyromegaly.  No evidence of jugular venous distension.   Respiratory: clear to auscultation bilaterally, no wheezing, no crackles. Normal respiratory effort. No accessory muscle use.  Cardiovascular: Regular rate and rhythm, no murmurs / rubs / gallops. No extremity edema. 2+ pedal pulses. No carotid bruits.  Chest:   Nontender without crepitus or deformity.   Back:   Nontender without crepitus or deformity. Abdomen: Abdomen is soft and nontender.  No evidence of intra-abdominal masses.  Positive bowel sounds noted in all quadrants.   Musculoskeletal: No joint deformity upper and lower extremities. Good ROM, no contractures. Normal muscle tone.  Neurologic: CN 2-12 grossly intact. Sensation intact.  Patient moving all 4 extremities spontaneously.  Patient is following all commands.  Patient is responsive to verbal stimuli.   Psychiatric: Patient exhibits normal mood with appropriate affect.  Patient seems to possess insight as to their current situation.     Labs on Admission: I have personally reviewed following labs and imaging studies -   CBC: Recent Labs  Lab 01/06/21 1543 01/07/21 0131  WBC 4.0 4.1  NEUTROABS  --  2.1  HGB 16.9 15.6  HCT 48.3 45.1  MCV 80.8 82.4  PLT 157 126*    Basic Metabolic Panel: Recent Labs  Lab 01/06/21 1543 01/06/21 1556  NA 136  --   K 2.3*  --   CL 103  --   CO2 20*  --   GLUCOSE 135*  --   BUN 24*  --  CREATININE 1.32*  --   CALCIUM 8.4*  --   MG  --  1.8   GFR: Estimated Creatinine Clearance: 62.1 mL/min (A) (by C-G formula based on SCr of 1.32 mg/dL (H)). Liver Function Tests: No results for input(s): AST, ALT, ALKPHOS, BILITOT, PROT, ALBUMIN in the last 168 hours. No results for input(s): LIPASE, AMYLASE in the last 168 hours. No results for input(s): AMMONIA in the last 168 hours. Coagulation Profile: No results for input(s): INR, PROTIME in the last 168 hours. Cardiac Enzymes: No results for input(s): CKTOTAL, CKMB, CKMBINDEX, TROPONINI in the last 168 hours. BNP (last 3 results) No results for input(s): PROBNP in the last 8760 hours. HbA1C: No results for input(s): HGBA1C in the last 72 hours. CBG: Recent Labs  Lab 01/06/21 1545 01/06/21 2229  GLUCAP 127* 162*   Lipid Profile: No results for input(s): CHOL, HDL, LDLCALC, TRIG, CHOLHDL, LDLDIRECT in the last 72 hours. Thyroid Function Tests: No results for input(s): TSH, T4TOTAL, FREET4, T3FREE, THYROIDAB in the last 72 hours. Anemia Panel: No results for input(s): VITAMINB12, FOLATE, FERRITIN, TIBC, IRON, RETICCTPCT in the last 72 hours. Urine analysis:    Component Value Date/Time   COLORURINE YELLOW 01/06/2021 1543   APPEARANCEUR CLEAR 01/06/2021 1543   LABSPEC 1.021 01/06/2021 1543   PHURINE 6.0 01/06/2021 1543   GLUCOSEU >1,000 (A) 01/06/2021 1543   HGBUR NEGATIVE 01/06/2021 1543   BILIRUBINUR NEGATIVE 01/06/2021 1543   KETONESUR 15 (A) 01/06/2021 1543   PROTEINUR 30 (A) 01/06/2021 1543   NITRITE NEGATIVE 01/06/2021 1543   LEUKOCYTESUR NEGATIVE 01/06/2021 1543    Radiological Exams on Admission - Personally Reviewed: DG Chest Portable 1 View  Result Date: 01/06/2021 CLINICAL DATA:  Chest pain and weakness. EXAM: PORTABLE CHEST 1 VIEW  COMPARISON:  02/08/2016 FINDINGS: Upper normal heart size. Unchanged mediastinal contours with aortic tortuosity and atherosclerosis. Suspected retrocardiac hiatal hernia. No acute airspace disease, pleural effusion, pneumothorax or pulmonary edema. No acute osseous abnormalities are seen. IMPRESSION: No acute chest findings. Electronically Signed   By: Keith Rake M.D.   On: 01/06/2021 16:44    EKG: Personally reviewed.  Rhythm is normal sinus rhythm with heart rate of 81 bpm.  No dynamic ST segment changes appreciated.  Assessment/Plan Principal Problem: Generalized weakness secondary to hypokalemia due to excessive gastrointestinal loss of potassium  Patient presenting with substantial hypokalemia of 2.3 Severe hypokalemia as well as point depletion are the likely contributors to patient's severe generalized weakness Hypokalemia likely caused by several days of severe watery diarrhea which seems to have largely resolved. Replacing potassium with both oral and intravenous potassium chloride Magnesium appears to be within normal limits Monitoring patient on telemetry Monitoring potassium levels with serial chemistries  Active Problems:   Chest pain  Patient experienced a concerning episode of 3 hours of midsternal chest pressure on Saturday Patient with known the patient has a known history of coronary artery disease diagnosed in 2012 but is supposed to be medically managed however patient admits to not taking any antiplatelet therapy for several years Chest discomfort was with exertion and eventually resolved with rest Initial troponins on arrival are unremarkable No dynamic ST segment change on EKG Monitoring patient on telemetry Continuing to cycle cardiac enzymes Echocardiogram ordered for the morning Will consider cardiology consultation in the morning for consideration of noninvasive ischemic assessment while hospitalized N.p.o. after midnight in case this is pursued     Essential hypertension  We will cautiously continue home regimen of antihypertensive therapy while monitoring  for evidence of hypotension    Coronary artery disease involving native coronary artery of native heart  Longstanding known history of CAD since cardiac catheterization in 2012 Patient was supposed to be medically managed however patient admits to not taking any antiplatelet therapy for several years Continue home regimen of beta-blocker and statin therapy Please see comments above for remainder of assessment and plan    Acute diarrhea  Seemingly self-limiting as symptoms seem to have mostly resolved at this point Supportive care    Mixed diabetic hyperlipidemia associated with type 2 diabetes mellitus ([place])  Continue home regimen of Crestor Lipid panel in the morning    Diabetic polyneuropathy associated with type 2 diabetes mellitus ([place])  Continue home regimen of gabapentin    GERD without esophagitis  Continue home regimen of PPI   Code Status:  Full code Family Communication: deferred   Status is: Observation  The patient remains OBS appropriate and will d/c before 2 midnights.  Dispo: The patient is from: Home              Anticipated d/c is to: Home              Patient currently is not medically stable to d/c.   Difficult to place patient No        Vernelle Emerald MD [facility] Pager [phone]  If 7PM-7AM, please contact night-coverage [email or web address] Use universal [place] password for that web site. If you do not have the password, please call the hospital operator.  01/07/2021, 2:17 AM

## 2021-01-08 DIAGNOSIS — E876 Hypokalemia: Secondary | ICD-10-CM | POA: Diagnosis not present

## 2021-01-08 LAB — CBC WITH DIFFERENTIAL/PLATELET
Abs Immature Granulocytes: 0.09 10*3/uL — ABNORMAL HIGH (ref 0.00–0.07)
Basophils Absolute: 0 10*3/uL (ref 0.0–0.1)
Basophils Relative: 1 %
Eosinophils Absolute: 0.2 10*3/uL (ref 0.0–0.5)
Eosinophils Relative: 4 %
HCT: 47.4 % (ref 39.0–52.0)
Hemoglobin: 16.2 g/dL (ref 13.0–17.0)
Immature Granulocytes: 2 %
Lymphocytes Relative: 18 %
Lymphs Abs: 1.1 10*3/uL (ref 0.7–4.0)
MCH: 28.2 pg (ref 26.0–34.0)
MCHC: 34.2 g/dL (ref 30.0–36.0)
MCV: 82.6 fL (ref 80.0–100.0)
Monocytes Absolute: 0.8 10*3/uL (ref 0.1–1.0)
Monocytes Relative: 13 %
Neutro Abs: 3.8 10*3/uL (ref 1.7–7.7)
Neutrophils Relative %: 62 %
Platelets: 146 10*3/uL — ABNORMAL LOW (ref 150–400)
RBC: 5.74 MIL/uL (ref 4.22–5.81)
RDW: 15.4 % (ref 11.5–15.5)
WBC: 5.9 10*3/uL (ref 4.0–10.5)
nRBC: 0 % (ref 0.0–0.2)

## 2021-01-08 LAB — BASIC METABOLIC PANEL
Anion gap: 7 (ref 5–15)
BUN: 11 mg/dL (ref 8–23)
CO2: 22 mmol/L (ref 22–32)
Calcium: 8 mg/dL — ABNORMAL LOW (ref 8.9–10.3)
Chloride: 110 mmol/L (ref 98–111)
Creatinine, Ser: 0.89 mg/dL (ref 0.61–1.24)
GFR, Estimated: >60 mL/min (ref >60)
Glucose, Bld: 145 mg/dL — ABNORMAL HIGH (ref 70–99)
Potassium: 2.9 mmol/L — ABNORMAL LOW (ref 3.5–5.1)
Sodium: 139 mmol/L (ref 135–145)

## 2021-01-08 LAB — PHOSPHORUS: Phosphorus: 2.1 mg/dL — ABNORMAL LOW (ref 2.5–4.6)

## 2021-01-08 LAB — GLUCOSE, CAPILLARY: Glucose-Capillary: 157 mg/dL — ABNORMAL HIGH (ref 70–99)

## 2021-01-08 LAB — MAGNESIUM: Magnesium: 1.8 mg/dL (ref 1.7–2.4)

## 2021-01-08 MED ORDER — POTASSIUM CHLORIDE CRYS ER 20 MEQ PO TBCR
40.0000 meq | EXTENDED_RELEASE_TABLET | Freq: Once | ORAL | Status: AC
  Administered 2021-01-08: 40 meq via ORAL
  Filled 2021-01-08: qty 2

## 2021-01-08 MED ORDER — MAGNESIUM 400 MG PO TABS: 400.0000 mg | ORAL_TABLET | Freq: Two times a day (BID) | ORAL | 0 refills | Status: AC

## 2021-01-08 MED ORDER — ASPIRIN 81 MG PO TBEC: 81.0000 mg | DELAYED_RELEASE_TABLET | Freq: Every day | ORAL | 11 refills | Status: AC

## 2021-01-08 MED ORDER — POTASSIUM PHOSPHATE MONOBASIC 500 MG PO TABS: 500.0000 mg | ORAL_TABLET | Freq: Three times a day (TID) | ORAL | 0 refills | Status: AC

## 2021-01-08 MED ORDER — VALSARTAN 160 MG PO TABS: 160.0000 mg | ORAL_TABLET | Freq: Every day | ORAL | 11 refills | Status: DC

## 2021-01-08 MED ORDER — POTASSIUM CHLORIDE CRYS ER 20 MEQ PO TBCR: 20.0000 meq | EXTENDED_RELEASE_TABLET | Freq: Two times a day (BID) | ORAL | 0 refills | Status: DC

## 2021-01-08 NOTE — Discharge Summary (Signed)
Physician Discharge Summary  Grant Williams XNT:[id number] DOB: 1955-07-01 DOA: 01/06/2021  PCP: Vicenta Aly, FNP  Admit date: 01/06/2021 Discharge date: 01/08/2021  Admitted From: Home Disposition: Home  Recommendations for Outpatient Follow-up:  Follow up with PCP in 1-2 weeks Please obtain BMP/CBC/magnesium/phosphorus in one week Schedule follow-up with cardiology.  Home Health: Not applicable Equipment/Devices: Not applicable  Discharge Condition: Stable CODE STATUS: Full code Diet recommendation: Low-salt and low-carb diet  Discharge summary: 65 year old gentleman with history of nonobstructive coronary artery disease, morbid obesity status post gastric bypass surgery in 2019 and chronic intermittent diarrhea who suffered from worsening diarrhea for the last few weeks and extreme weakness and shortness of breath.  Came to the emergency room and found to have potassium of 2.3.  Troponin and EKG nonischemic.  Hypokalemia due to excess GI loss: His diarrhea is self-limiting.  He was replaced aggressively with IV and oral potassium.  Symptomatically stabilizing.   Given 80 mEq of potassium today before discharge, discharged on potassium chloride 20 mill equivalent 2 times a day for a week. Was also given IV magnesium, will discharge with oral replacement magnesium as well as phosphorus. Hopefully since his GI symptoms have improved his levels will stabilize. Will request recheck potassium/magnesium and phosphorus in 1 week.  Shortness of breath and chest discomfort: Acute coronary syndrome ruled out.  Serial troponins and EKG nonischemic.  2D echocardiogram with no evidence of any wall motion abnormalities.  Patient currently asymptomatic. He was taken off aspirin since gastric bypass in 2019, he will resume it.  Hypertension: Well controlled.  On valsartan hydrochlorothiazide.  Persistent severe hypokalemia.  Will discontinue hydrochlorothiazide, replaced with valsartan  only.  Type 2 diabetes on insulin: Well-controlled.  Resume home medications.  Patient is walking around in the hallway.  Mostly asymptomatic today.  Able to go home.  Discharge Diagnoses:  Principal Problem:   Hypokalemia due to excessive gastrointestinal loss of potassium Active Problems:   Essential hypertension   Coronary artery disease involving native coronary artery of native heart   Chest pain   Acute diarrhea   Mixed diabetic hyperlipidemia associated with type 2 diabetes mellitus (HCC)   Diabetic polyneuropathy associated with type 2 diabetes mellitus (HCC)   GERD without esophagitis   Generalized weakness    Discharge Instructions  Discharge Instructions     Diet - low sodium heart healthy   Complete by: As directed    Diet Carb Modified   Complete by: As directed    Discharge instructions   Complete by: As directed    Schedule follow up with primary care office. Will need repeat BMP/magnesium/ Phosphorus checked in one week.   Increase activity slowly   Complete by: As directed       Allergies as of 01/08/2021       Reactions   Other    Steroids   Does not tolerate bad for stomach has to take med before and after if uses any steroid   Penicillins Other (See Comments)   Unknown childhood allergic reaction - no other information available        Medication List     STOP taking these medications    docusate sodium 250 MG capsule Commonly known as: COLACE   ibuprofen 200 MG tablet Commonly known as: ADVIL   valsartan-hydrochlorothiazide 320-12.5 MG tablet Commonly known as: DIOVAN-HCT       TAKE these medications    amLODipine 10 MG tablet Commonly known as: NORVASC TAKE 1 TABLET BY MOUTH DAILY  ARIPiprazole 5 MG tablet Commonly known as: ABILIFY Take 5 mg by mouth daily.   aspirin 81 MG EC tablet Take 1 tablet (81 mg total) by mouth daily. Swallow whole. Start taking on: January 09, 2021   CALCIUM CITRATE-VITAMIN D PO Take 1  tablet by mouth 2 (two) times daily. 600 + vit d3   cholecalciferol 25 MCG (1000 UNIT) tablet Commonly known as: VITAMIN D3 Take 1,000 Units by mouth 2 (two) times daily.   clotrimazole-betamethasone cream Commonly known as: LOTRISONE Apply 1 application topically 2 (two) times daily.   dapagliflozin propanediol 10 MG Tabs tablet Commonly known as: Farxiga Take 1 tablet (10 mg total) by mouth daily before breakfast.   DULoxetine 30 MG capsule Commonly known as: CYMBALTA Take 30 mg by mouth 3 (three) times daily.   ergocalciferol 1.25 MG (50000 UT) capsule Commonly known as: VITAMIN D2 Take 50,000 Units by mouth 2 (two) times a week. Mon / wed /and Fri   gabapentin 800 MG tablet Commonly known as: NEURONTIN Take 800 mg by mouth 4 (four) times daily.   iron polysaccharides 150 MG capsule Commonly known as: NIFEREX Take 1 capsule by mouth 2 (two) times daily.   Magnesium 400 MG Tabs Take 400 mg by mouth 2 (two) times daily for 7 days.   metFORMIN 500 MG tablet Commonly known as: GLUCOPHAGE TAKE 2 TABLETS(1000 MG) BY MOUTH TWICE DAILY WITH A MEAL   metoprolol tartrate 100 MG tablet Commonly known as: LOPRESSOR Take 50-100 mg by mouth See admin instructions. 100 mg in the morning, 50 mg at bedtime   mupirocin ointment 2 % Commonly known as: BACTROBAN Apply 1 application topically 3 (three) times daily.   nortriptyline 10 MG capsule Commonly known as: PAMELOR Take 3 capsules by mouth at bedtime.   onetouch ultrasoft lancets Use 1-4 times daily as needed/directed  DX E11.9   OneTouch Verio test strip Generic drug: glucose blood Use 1-4 times daily as directed/needed   DX E11.9   OneTouch Verio w/Device Kit Use 1-4 times daily as needed/directed DX E11.9   oxybutynin 10 MG 24 hr tablet Commonly known as: DITROPAN-XL Take 10 mg by mouth at bedtime.   pantoprazole 40 MG tablet Commonly known as: PROTONIX Take 40 mg by mouth daily.   potassium chloride SA 20  MEQ tablet Commonly known as: KLOR-CON Take 1 tablet (20 mEq total) by mouth 2 (two) times daily for 7 days.   potassium phosphate (monobasic) 500 MG tablet Commonly known as: K-PHOS ORIGINAL Take 1 tablet (500 mg total) by mouth 3 (three) times daily with meals for 7 days.   pregabalin 300 MG capsule Commonly known as: LYRICA Take 300 mg by mouth 2 (two) times daily.   rOPINIRole 1 MG tablet Commonly known as: REQUIP Take 1-2 mg by mouth at bedtime.   rosuvastatin 10 MG tablet Commonly known as: CRESTOR Take 10 mg by mouth 3 (three) times a week. M/ wed/ fri   traZODone 50 MG tablet Commonly known as: DESYREL Take 50 mg by mouth at bedtime as needed for sleep.   Trulicity 4.5 OI/3.2PQ Sopn Generic drug: Dulaglutide INJECT 4.5 MG INTO THE SKIN EVERY WEEK What changed: See the new instructions.   valsartan 160 MG tablet Commonly known as: Diovan Take 1 tablet (160 mg total) by mouth daily.        Follow-up Information     Vicenta Aly, FNP Follow up in 1 week(s).   Specialty: Nurse Practitioner Contact information: [address]  [place] [address] [phone]         Lorretta Harp, MD .   Specialties: Cardiology, Radiology Contact information: [address] [place] [address] [phone]                Allergies  Allergen Reactions   Other     Steroids   Does not tolerate bad for stomach has to take med before and after if uses any steroid    Penicillins Other (See Comments)    Unknown childhood allergic reaction - no other information available    Consultations: None   Procedures/Studies: DG Chest Portable 1 View  Result Date: 01/06/2021 CLINICAL DATA:  Chest pain and weakness. EXAM: PORTABLE CHEST 1 VIEW COMPARISON:  02/08/2016 FINDINGS: Upper normal heart size. Unchanged mediastinal contours with aortic tortuosity and atherosclerosis. Suspected retrocardiac hiatal hernia. No acute airspace  disease, pleural effusion, pneumothorax or pulmonary edema. No acute osseous abnormalities are seen. IMPRESSION: No acute chest findings. Electronically Signed   By: Keith Rake M.D.   On: 01/06/2021 16:44   ECHOCARDIOGRAM COMPLETE  Result Date: 01/07/2021    ECHOCARDIOGRAM REPORT   Patient Name:   MIVAAN CORBITT Date of Exam: 01/07/2021 Medical Rec #:  [id number]        Height:       69.0 in Accession #:    [id number]       Weight:       206.0 lb Date of Birth:  [birth year]       BSA:          2.092 m Patient Age:    67 years         BP:           156/82 mmHg Patient Gender: M                HR:           77 bpm. Exam Location:  Inpatient Procedure: 2D Echo, Cardiac Doppler and Color Doppler Indications:    Chest pain  History:        Patient has prior history of Echocardiogram examinations, most                 recent 02/09/2016. CAD, Signs/Symptoms:Shortness of Breath; Risk                 Factors:Diabetes, Hypertension and Dyslipidemia.  Sonographer:    [place] Referring Phys: [id number] [place]  1. Left ventricular ejection fraction, by estimation, is 65 to 70%. The left ventricle has normal function. The left ventricle has no regional wall motion abnormalities. Left ventricular diastolic parameters are consistent with Grade II diastolic dysfunction (pseudonormalization). Elevated left atrial pressure.  2. Right ventricular systolic function is normal. The right ventricular size is normal. There is normal pulmonary artery systolic pressure.  3. Left atrial size was mildly dilated.  4. The mitral valve is normal in structure. No evidence of mitral valve regurgitation. No evidence of mitral stenosis.  5. The aortic valve is normal in structure. Aortic valve regurgitation is not visualized. No aortic stenosis is present.  6. The inferior vena cava is normal in size with greater than 50% respiratory variability, suggesting right atrial pressure of 3 mmHg. Comparison(s): Prior  images unable to be directly viewed, comparison made by report only. The left ventricular diastolic function is significantly worse. FINDINGS  Left Ventricle: Left ventricular ejection fraction, by estimation, is 65 to  70%. The left ventricle has normal function. The left ventricle has no regional wall motion abnormalities. The left ventricular internal cavity size was normal in size. There is  no left ventricular hypertrophy. Left ventricular diastolic parameters are consistent with Grade II diastolic dysfunction (pseudonormalization). Elevated left atrial pressure. Right Ventricle: The right ventricular size is normal. No increase in right ventricular wall thickness. Right ventricular systolic function is normal. There is normal pulmonary artery systolic pressure. The tricuspid regurgitant velocity is 2.00 m/s, and  with an assumed right atrial pressure of 3 mmHg, the estimated right ventricular systolic pressure is 38.2 mmHg. Left Atrium: Left atrial size was mildly dilated. Right Atrium: Right atrial size was normal in size. Pericardium: There is no evidence of pericardial effusion. Mitral Valve: The mitral valve is normal in structure. No evidence of mitral valve regurgitation. No evidence of mitral valve stenosis. Tricuspid Valve: The tricuspid valve is normal in structure. Tricuspid valve regurgitation is not demonstrated. No evidence of tricuspid stenosis. Aortic Valve: The aortic valve is normal in structure. Aortic valve regurgitation is not visualized. No aortic stenosis is present. Aortic valve mean gradient measures 6.0 mmHg. Aortic valve peak gradient measures 12.2 mmHg. Aortic valve area, by VTI measures 2.49 cm. Pulmonic Valve: The pulmonic valve was normal in structure. Pulmonic valve regurgitation is not visualized. No evidence of pulmonic stenosis. Aorta: The aortic root is normal in size and structure. Venous: The inferior vena cava is normal in size with greater than 50% respiratory  variability, suggesting right atrial pressure of 3 mmHg. IAS/Shunts: No atrial level shunt detected by color flow Doppler.  LEFT VENTRICLE PLAX 2D LVIDd:         5.30 cm      Diastology LVIDs:         3.10 cm      LV e' medial:    6.31 cm/s LV PW:         0.90 cm      LV E/e' medial:  14.0 LV IVS:        0.70 cm      LV e' lateral:   6.74 cm/s LVOT diam:     2.10 cm      LV E/e' lateral: 13.1 LV SV:         78 LV SV Index:   37 LVOT Area:     3.46 cm  LV Volumes (MOD) LV vol d, MOD A2C: 102.0 ml LV vol d, MOD A4C: 91.1 ml LV vol s, MOD A2C: 28.8 ml LV vol s, MOD A4C: 28.5 ml LV SV MOD A2C:     73.2 ml LV SV MOD A4C:     91.1 ml LV SV MOD BP:      62.3 ml RIGHT VENTRICLE RV Basal diam:  5.10 cm RV Mid diam:    3.40 cm RV S prime:     22.80 cm/s TAPSE (M-mode): 3.4 cm LEFT ATRIUM             Index       RIGHT ATRIUM           Index LA diam:        4.00 cm 1.91 cm/m  RA Area:     20.10 cm LA Vol (A2C):   67.6 ml 32.31 ml/m RA Volume:   49.10 ml  23.47 ml/m LA Vol (A4C):   38.1 ml 18.21 ml/m LA Biplane Vol: 52.4 ml 25.05 ml/m  AORTIC VALVE  PULMONIC VALVE AV Area (Vmax):    2.34 cm     PV Vmax:       1.36 m/s AV Area (Vmean):   2.44 cm     PV Vmean:      93.100 cm/s AV Area (VTI):     2.49 cm     PV VTI:        0.267 m AV Vmax:           175.00 cm/s  PV Peak grad:  7.4 mmHg AV Vmean:          112.000 cm/s PV Mean grad:  4.0 mmHg AV VTI:            0.314 m AV Peak Grad:      12.2 mmHg AV Mean Grad:      6.0 mmHg LVOT Vmax:         118.00 cm/s LVOT Vmean:        78.800 cm/s LVOT VTI:          0.226 m LVOT/AV VTI ratio: 0.72  AORTA Ao Root diam: 3.20 cm Ao Asc diam:  3.20 cm MITRAL VALVE               TRICUSPID VALVE MV Area (PHT): 2.93 cm    TR Peak grad:   16.0 mmHg MV Decel Time: 259 msec    TR Vmax:        200.00 cm/s MV E velocity: 88.60 cm/s MV A velocity: 90.80 cm/s  SHUNTS MV E/A ratio:  0.98        Systemic VTI:  0.23 m                            Systemic Diam: 2.10 cm Dani Gobble Croitoru  MD Electronically signed by Sanda Klein MD Signature Date/Time: 01/07/2021/9:37:57 AM    Final    (Echo, Carotid, EGD, Colonoscopy, ERCP)    Subjective: Patient seen and examined.  Denies any nausea vomiting.  He had 2 well-formed stools since last 24 hours.  He has intermittent diarrhea 1 or 2 times a day which is usual for him.  Eager to go home.   Discharge Exam: Vitals:   01/08/21 0513 01/08/21 0800  BP: 131/79 130/80  Pulse: 73 71  Resp: 18 (!) 21  Temp: 98 F (36.7 C) 97.8 F (36.6 C)  SpO2: 96% 97%   Vitals:   01/07/21 1246 01/07/21 2017 01/08/21 0513 01/08/21 0800  BP: 129/82 136/73 131/79 130/80  Pulse: 67 78 73 71  Resp: 18 18 18  (!) 21  Temp: 98.3 F (36.8 C) 98.3 F (36.8 C) 98 F (36.7 C) 97.8 F (36.6 C)  TempSrc: Oral Oral Oral Oral  SpO2: 99% 98% 96% 97%  Weight:      Height:        General: Pt is alert, awake, not in acute distress Cardiovascular: RRR, S1/S2 +, no rubs, no gallops Respiratory: CTA bilaterally, no wheezing, no rhonchi Abdominal: Soft, NT, ND, bowel sounds + Extremities: no edema, no cyanosis    The results of significant diagnostics from this hospitalization (including imaging, microbiology, ancillary and laboratory) are listed below for reference.     Microbiology: Recent Results (from the past 240 hour(s))  Resp Panel by RT-PCR (Flu A&B, Covid) Nasopharyngeal Swab     Status: None   Collection Time: 01/06/21  4:14 PM   Specimen: Nasopharyngeal Swab; Nasopharyngeal(NP) swabs in vial transport medium  Result  Value Ref Range Status   SARS Coronavirus 2 by RT PCR NEGATIVE NEGATIVE Final    Comment: (NOTE) SARS-CoV-2 target nucleic acids are NOT DETECTED.  The SARS-CoV-2 RNA is generally detectable in upper respiratory specimens during the acute phase of infection. The lowest concentration of SARS-CoV-2 viral copies this assay can detect is 138 copies/mL. A negative result does not preclude SARS-Cov-2 infection and should not  be used as the sole basis for treatment or other patient management decisions. A negative result may occur with  improper specimen collection/handling, submission of specimen other than nasopharyngeal swab, presence of viral mutation(s) within the areas targeted by this assay, and inadequate number of viral copies(<138 copies/mL). A negative result must be combined with clinical observations, patient history, and epidemiological information. The expected result is Negative.  Fact Sheet for Patients:  EntrepreneurPulse.com.au  Fact Sheet for Healthcare Providers:  IncredibleEmployment.be  This test is no t yet approved or cleared by the [place] FDA and  has been authorized for detection and/or diagnosis of SARS-CoV-2 by FDA under an Emergency Use Authorization (EUA). This EUA will remain  in effect (meaning this test can be used) for the duration of the COVID-19 declaration under Section 564(b)(1) of the Act, 21 U.S.C.section 360bbb-3(b)(1), unless the authorization is terminated  or revoked sooner.       Influenza A by PCR NEGATIVE NEGATIVE Final   Influenza B by PCR NEGATIVE NEGATIVE Final    Comment: (NOTE) The Xpert Xpress SARS-CoV-2/FLU/RSV plus assay is intended as an aid in the diagnosis of influenza from Nasopharyngeal swab specimens and should not be used as a sole basis for treatment. Nasal washings and aspirates are unacceptable for Xpert Xpress SARS-CoV-2/FLU/RSV testing.  Fact Sheet for Patients: EntrepreneurPulse.com.au  Fact Sheet for Healthcare Providers: IncredibleEmployment.be  This test is not yet approved or cleared by the [place] FDA and has been authorized for detection and/or diagnosis of SARS-CoV-2 by FDA under an Emergency Use Authorization (EUA). This EUA will remain in effect (meaning this test can be used) for the duration of the COVID-19 declaration under Section  564(b)(1) of the Act, 21 U.S.C. section 360bbb-3(b)(1), unless the authorization is terminated or revoked.  Performed at [facility], [address] [place], [place] [address]      Labs: BNP (last 3 results) Recent Labs    01/06/21 1602  BNP 32.2   Basic Metabolic Panel: Recent Labs  Lab 01/06/21 1543 01/06/21 1556 01/07/21 0131 01/08/21 0324  NA 136  --  139 139  K 2.3*  --  2.7* 2.9*  CL 103  --  110 110  CO2 20*  --  17* 22  GLUCOSE 135*  --  159* 145*  BUN 24*  --  20 11  CREATININE 1.32*  --  1.02 0.89  CALCIUM 8.4*  --  8.2* 8.0*  MG  --  1.8 1.9 1.8  PHOS  --   --   --  2.1*   Liver Function Tests: Recent Labs  Lab 01/07/21 0131  AST 15  ALT 23  ALKPHOS 43  BILITOT 1.0  PROT 5.7*  ALBUMIN 3.3*   No results for input(s): LIPASE, AMYLASE in the last 168 hours. No results for input(s): AMMONIA in the last 168 hours. CBC: Recent Labs  Lab 01/06/21 1543 01/07/21 0131 01/08/21 0324  WBC 4.0 4.1 5.9  NEUTROABS  --  2.1 3.8  HGB 16.9 15.6 16.2  HCT 48.3 45.1 47.4  MCV 80.8 82.4 82.6  PLT 157 126* 146*   Cardiac Enzymes: No results for input(s): CKTOTAL, CKMB, CKMBINDEX, TROPONINI in the last 168 hours. BNP: Invalid input(s): POCBNP CBG: Recent Labs  Lab 01/07/21 0739 01/07/21 1205 01/07/21 1645 01/07/21 2016 01/08/21 0849  GLUCAP 153* 144* 114* 161* 157*   D-Dimer No results for input(s): DDIMER in the last 72 hours. Hgb A1c Recent Labs    01/07/21 0131  HGBA1C 6.3*   Lipid Profile Recent Labs    01/07/21 0131  CHOL 79  HDL 25*  LDLCALC 25  TRIG 143  CHOLHDL 3.2   Thyroid function studies No results for input(s): TSH, T4TOTAL, T3FREE, THYROIDAB in the last 72 hours.  Invalid input(s): FREET3 Anemia work up No results for input(s): VITAMINB12, FOLATE, FERRITIN, TIBC, IRON, RETICCTPCT in the last 72 hours. Urinalysis    Component Value Date/Time   COLORURINE YELLOW 01/06/2021 1543   APPEARANCEUR  CLEAR 01/06/2021 1543   LABSPEC 1.021 01/06/2021 1543   PHURINE 6.0 01/06/2021 1543   GLUCOSEU >1,000 (A) 01/06/2021 1543   HGBUR NEGATIVE 01/06/2021 1543   BILIRUBINUR NEGATIVE 01/06/2021 1543   KETONESUR 15 (A) 01/06/2021 1543   PROTEINUR 30 (A) 01/06/2021 1543   NITRITE NEGATIVE 01/06/2021 1543   LEUKOCYTESUR NEGATIVE 01/06/2021 1543   Sepsis Labs Invalid input(s): PROCALCITONIN,  WBC,  LACTICIDVEN Microbiology Recent Results (from the past 240 hour(s))  Resp Panel by RT-PCR (Flu A&B, Covid) Nasopharyngeal Swab     Status: None   Collection Time: 01/06/21  4:14 PM   Specimen: Nasopharyngeal Swab; Nasopharyngeal(NP) swabs in vial transport medium  Result Value Ref Range Status   SARS Coronavirus 2 by RT PCR NEGATIVE NEGATIVE Final    Comment: (NOTE) SARS-CoV-2 target nucleic acids are NOT DETECTED.  The SARS-CoV-2 RNA is generally detectable in upper respiratory specimens during the acute phase of infection. The lowest concentration of SARS-CoV-2 viral copies this assay can detect is 138 copies/mL. A negative result does not preclude SARS-Cov-2 infection and should not be used as the sole basis for treatment or other patient management decisions. A negative result may occur with  improper specimen collection/handling, submission of specimen other than nasopharyngeal swab, presence of viral mutation(s) within the areas targeted by this assay, and inadequate number of viral copies(<138 copies/mL). A negative result must be combined with clinical observations, patient history, and epidemiological information. The expected result is Negative.  Fact Sheet for Patients:  EntrepreneurPulse.com.au  Fact Sheet for Healthcare Providers:  IncredibleEmployment.be  This test is no t yet approved or cleared by the [place] FDA and  has been authorized for detection and/or diagnosis of SARS-CoV-2 by FDA under an Emergency Use Authorization (EUA).  This EUA will remain  in effect (meaning this test can be used) for the duration of the COVID-19 declaration under Section 564(b)(1) of the Act, 21 U.S.C.section 360bbb-3(b)(1), unless the authorization is terminated  or revoked sooner.       Influenza A by PCR NEGATIVE NEGATIVE Final   Influenza B by PCR NEGATIVE NEGATIVE Final    Comment: (NOTE) The Xpert Xpress SARS-CoV-2/FLU/RSV plus assay is intended as an aid in the diagnosis of influenza from Nasopharyngeal swab specimens and should not be used as a sole basis for treatment. Nasal washings and aspirates are unacceptable for Xpert Xpress SARS-CoV-2/FLU/RSV testing.  Fact Sheet for Patients: EntrepreneurPulse.com.au  Fact Sheet for Healthcare Providers: IncredibleEmployment.be  This test is not yet approved or cleared by the [place] FDA and has been authorized for detection and/or diagnosis of  SARS-CoV-2 by FDA under an Emergency Use Authorization (EUA). This EUA will remain in effect (meaning this test can be used) for the duration of the COVID-19 declaration under Section 564(b)(1) of the Act, 21 U.S.C. section 360bbb-3(b)(1), unless the authorization is terminated or revoked.  Performed at [facility], [address], [place], [place] [address]      Time coordinating discharge:  30 minutes  SIGNED:   Barb Merino, MD  [facility] 01/08/2021, 12:09 PM

## 2021-01-14 ENCOUNTER — Encounter: Payer: Self-pay | Admitting: Cardiovascular Disease

## 2021-01-14 ENCOUNTER — Ambulatory Visit (INDEPENDENT_AMBULATORY_CARE_PROVIDER_SITE_OTHER): Payer: Commercial Managed Care - PPO | Admitting: Cardiovascular Disease

## 2021-01-14 ENCOUNTER — Other Ambulatory Visit: Payer: Self-pay

## 2021-01-14 DIAGNOSIS — E782 Mixed hyperlipidemia: Secondary | ICD-10-CM

## 2021-01-14 DIAGNOSIS — I25119 Atherosclerotic heart disease of native coronary artery with unspecified angina pectoris: Secondary | ICD-10-CM | POA: Diagnosis not present

## 2021-01-14 DIAGNOSIS — I10 Essential (primary) hypertension: Secondary | ICD-10-CM

## 2021-01-14 NOTE — Progress Notes (Signed)
01/14/2021 [name]   May 24, 1956  [id number]  Primary Physician Vicenta Aly, [place] Primary Cardiologist: Lorretta Harp MD FACP, [place], [place], [place]  HPI:  Grant Williams is a 65 y.o.  severely overweight married Caucasian male father of 2, grandfather 3 grandchildren who I last saw in the office 10/16/2020.  He was referred by Nonda Lou, nurse practitioner, for cardiovascular evaluation and clearance for his planned Roux-en-Y gastric bypass operation early next year. History of factors include treated hypertension, diabetes and hyperlipidemia. He has never had a heart attack or stroke. He denies chest pain but does get some dyspnea probably related to obesity. 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 Does have sleep apnea on BiPAP in the past which she no longer uses.  He has seen Dr. Julianne Handler in the past and has had cardiac catheterization 2002 revealing moderate diagonal branch disease treated medically. He was hospitalized because of elevated CPK of unknown etiology. A 2-D echo was essentially normal and a Myoview stress test showed inferolateral scar without ischemia thought to be right low risk.   He did have a Roux-en-Y gastric bypass procedure performed #53/18 and has lost 50 pounds. He feels clinically improved He denies chest pain or shortness of breath. Does have diabetic peripheral neuropathy which limits his ability to exercise.  He was recently mated to the hospital 01/06/2021 for 2 days with severe hypokalemia related to GI infection.  His potassium and magnesium were repleted.  He no longer has diarrhea.  He did have 1 episode of chest pain prior to admission which has not recurred.   .   Current Meds  Medication Sig   amLODipine (NORVASC) 10 MG tablet TAKE 1 TABLET BY MOUTH DAILY   ARIPiprazole (ABILIFY) 5 MG tablet Take 5 mg by mouth daily.    aspirin EC 81 MG EC tablet Take 1 tablet (81 mg total) by mouth daily. Swallow whole.   Blood  Glucose Monitoring Suppl (ONETOUCH VERIO) w/Device KIT Use 1-4 times daily as needed/directed DX E11.9   CALCIUM CITRATE-VITAMIN D PO Take 1 tablet by mouth 2 (two) times daily. 600 + vit d3   cholecalciferol (VITAMIN D3) 25 MCG (1000 UT) tablet Take 1,000 Units by mouth 2 (two) times daily.   clotrimazole-betamethasone (LOTRISONE) cream Apply 1 application topically 2 (two) times daily.   dapagliflozin propanediol (FARXIGA) 10 MG TABS tablet Take 1 tablet (10 mg total) by mouth daily before breakfast.   DULoxetine (CYMBALTA) 30 MG capsule Take 30 mg by mouth 3 (three) times daily.    ergocalciferol (VITAMIN D2) 50000 units capsule Take 50,000 Units by mouth 2 (two) times a week. Mon / wed /and Fri   gabapentin (NEURONTIN) 800 MG tablet Take 800 mg by mouth 4 (four) times daily.    iron polysaccharides (NIFEREX) 150 MG capsule Take 1 capsule by mouth 2 (two) times daily.   Lancets (ONETOUCH ULTRASOFT) lancets Use 1-4 times daily as needed/directed  DX E11.9   Magnesium 400 MG TABS Take 400 mg by mouth 2 (two) times daily for 7 days.   metFORMIN (GLUCOPHAGE) 500 MG tablet TAKE 2 TABLETS(1000 MG) BY MOUTH TWICE DAILY WITH A MEAL   metoprolol (LOPRESSOR) 100 MG tablet Take 50-100 mg by mouth See admin instructions. 100 mg in the morning, 50 mg at bedtime   mupirocin ointment (BACTROBAN) 2 % Apply 1 application topically 3 (three) times daily.   nortriptyline (PAMELOR) 10 MG capsule Take 3 capsules by  mouth at bedtime.   ONETOUCH VERIO test strip Use 1-4 times daily as directed/needed   DX E11.9   oxybutynin (DITROPAN-XL) 10 MG 24 hr tablet Take 10 mg by mouth at bedtime.   pantoprazole (PROTONIX) 40 MG tablet Take 40 mg by mouth daily.    potassium chloride SA (KLOR-CON) 20 MEQ tablet Take 1 tablet (20 mEq total) by mouth 2 (two) times daily for 7 days.   potassium phosphate, monobasic, (K-PHOS ORIGINAL) 500 MG tablet Take 1 tablet (500 mg total) by mouth 3 (three) times daily with meals for 7 days.    pregabalin (LYRICA) 300 MG capsule Take 300 mg by mouth 2 (two) times daily.    rOPINIRole (REQUIP) 1 MG tablet Take 1-2 mg by mouth at bedtime.    rosuvastatin (CRESTOR) 10 MG tablet Take 10 mg by mouth 3 (three) times a week. M/ wed/ fri   traZODone (DESYREL) 50 MG tablet Take 50 mg by mouth at bedtime as needed for sleep.   TRULICITY 4.5 QM/2.5OI SOPN INJECT 4.5 MG INTO THE SKIN EVERY WEEK   valsartan (DIOVAN) 160 MG tablet Take 1 tablet (160 mg total) by mouth daily.     Allergies  Allergen Reactions   Other     Steroids   Does not tolerate bad for stomach has to take med before and after if uses any steroid    Penicillins Other (See Comments)    Unknown childhood allergic reaction - no other information available    Social History   Socioeconomic History   Marital status: Married    Spouse name: Not on file   Number of children: Not on file   Years of education: Not on file   Highest education level: Not on file  Occupational History   Not on file  Tobacco Use   Smoking status: Former   Smokeless tobacco: Former    Types: Chew   Tobacco comments:    30 years ago  Electronics engineer Use   Vaping Use: Never used  Substance and Sexual Activity   Alcohol use: No   Drug use: No   Sexual activity: Not Currently  Other Topics Concern   Not on file  Social History Narrative   Not on file   Social Determinants of Health   Financial Resource Strain: Not on file  Food Insecurity: Not on file  Transportation Needs: Not on file  Physical Activity: Not on file  Stress: Not on file  Social Connections: Not on file  Intimate Partner Violence: Not on file     Review of Systems: General: negative for chills, fever, night sweats or weight changes.  Cardiovascular: negative for chest pain, dyspnea on exertion, edema, orthopnea, palpitations, paroxysmal nocturnal dyspnea or shortness of breath Dermatological: negative for rash Respiratory: negative for cough or wheezing Urologic:  negative for hematuria Abdominal: negative for nausea, vomiting, diarrhea, bright red blood per rectum, melena, or hematemesis Neurologic: negative for visual changes, syncope, or dizziness All other systems reviewed and are otherwise negative except as noted above.    Blood pressure 122/76, pulse 70, height 5' 9.5" (1.765 m), weight 209 lb (94.8 kg).  General appearance: alert and no distress Neck: no adenopathy, no carotid bruit, no JVD, supple, symmetrical, trachea midline, and thyroid not enlarged, symmetric, no tenderness/mass/nodules Lungs: clear to auscultation bilaterally Heart: regular rate and rhythm, S1, S2 normal, no murmur, click, rub or gallop Extremities: extremities normal, atraumatic, no cyanosis or edema Pulses: 2+ and symmetric Skin: Skin color, texture, turgor  normal. No rashes or lesions Neurologic: Grossly normal  EKG not performed today  ASSESSMENT AND PLAN:   HLD (hyperlipidemia) History of hyperlipidemia on statin therapy with lipid profile performed 01/07/2021 revealing total cholesterol 79, LDL 25 and HDL 25.  Essential hypertension History of essential hypertension blood pressure measured today 122/76.  He is on amlodipine, metoprolol and valsartan.  Coronary artery disease involving native coronary artery of native heart History of CAD status post remote cardiac catheterization by Dr. Angelena Form revealing diagonal branch disease treated medically.  He had 1 episode of chest pain earlier this month prior to being admitted with hypokalemia due to diarrhea but none since.     Lorretta Harp MD FACP,FACC,FAHA, [facility] 01/14/2021 3:47 PM

## 2021-01-14 NOTE — Assessment & Plan Note (Signed)
History of CAD status post remote cardiac catheterization by Dr. Clifton James revealing diagonal branch disease treated medically.  He had 1 episode of chest pain earlier this month prior to being admitted with hypokalemia due to diarrhea but none since.

## 2021-01-14 NOTE — Assessment & Plan Note (Signed)
History of hyperlipidemia on statin therapy with lipid profile performed 01/07/2021 revealing total cholesterol 79, LDL 25 and HDL 25.

## 2021-01-14 NOTE — Patient Instructions (Signed)

## 2021-01-14 NOTE — Assessment & Plan Note (Signed)
History of essential hypertension blood pressure measured today 122/76.  He is on amlodipine, metoprolol and valsartan.

## 2021-01-31 ENCOUNTER — Other Ambulatory Visit: Payer: Self-pay | Admitting: Endocrinology

## 2021-01-31 DIAGNOSIS — Z794 Long term (current) use of insulin: Secondary | ICD-10-CM

## 2021-01-31 DIAGNOSIS — E119 Type 2 diabetes mellitus without complications: Secondary | ICD-10-CM

## 2021-02-19 ENCOUNTER — Other Ambulatory Visit: Payer: Self-pay | Admitting: Physician Assistant

## 2021-02-19 DIAGNOSIS — D644 Congenital dyserythropoietic anemia: Secondary | ICD-10-CM

## 2021-02-19 DIAGNOSIS — Q8789 Other specified congenital malformation syndromes, not elsewhere classified: Secondary | ICD-10-CM

## 2021-02-19 DIAGNOSIS — R634 Abnormal weight loss: Secondary | ICD-10-CM

## 2021-03-01 ENCOUNTER — Other Ambulatory Visit: Payer: Self-pay | Admitting: Endocrinology

## 2021-03-01 DIAGNOSIS — E119 Type 2 diabetes mellitus without complications: Secondary | ICD-10-CM

## 2021-03-06 ENCOUNTER — Ambulatory Visit (INDEPENDENT_AMBULATORY_CARE_PROVIDER_SITE_OTHER): Payer: Commercial Managed Care - PPO | Admitting: Endocrinology

## 2021-03-06 ENCOUNTER — Other Ambulatory Visit: Payer: Self-pay

## 2021-03-06 VITALS — BP 140/80 | HR 70 | Ht 69.5 in | Wt 207.4 lb

## 2021-03-06 DIAGNOSIS — E119 Type 2 diabetes mellitus without complications: Secondary | ICD-10-CM | POA: Diagnosis not present

## 2021-03-06 DIAGNOSIS — I25119 Atherosclerotic heart disease of native coronary artery with unspecified angina pectoris: Secondary | ICD-10-CM

## 2021-03-06 DIAGNOSIS — Z794 Long term (current) use of insulin: Secondary | ICD-10-CM | POA: Diagnosis not present

## 2021-03-06 LAB — POCT GLYCOSYLATED HEMOGLOBIN (HGB A1C): Hemoglobin A1C: 6.1 % — AB (ref 4.0–5.6)

## 2021-03-06 MED ORDER — METFORMIN HCL ER 500 MG PO TB24: 1000.0000 mg | ORAL_TABLET | Freq: Every day | ORAL | 3 refills | Status: DC

## 2021-03-06 NOTE — Patient Instructions (Addendum)
Please continue the same diabetes medications.   check your blood sugar once a day.  vary the time of day when you check, between before the 3 meals, and at bedtime.  also check if you have symptoms of your blood sugar being too high or too low.  please keep a record of the readings and bring it to your next appointment here (or you can bring the meter itself).  You can write it on any piece of paper.  please call us sooner if your blood sugar goes below 70, or if you have a lot of readings over 200.   Please come back for a follow-up appointment in 4 months.   

## 2021-03-06 NOTE — Progress Notes (Signed)
Subjective:    Patient ID: Grant Williams, male    DOB: [birth year], 65 y.o.   MRN: [id number]  HPI Pt returns for f/u of diabetes mellitus:  DM type: 2. Dx'ed: 2008.   Complications: PN, DN, and CAD.   Therapy: Trulicity and 2 oral meds.     DKA: never.  Severe hypoglycemia: never.   Pancreatitis: never.    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   Other: He took insulin 2015-2018; he had gastric bypass surgery in 2018.   Interval history: pt says cbg's vary from 110-160.  No new sxs.  He takes meds as rx'ed.  He says metformin is 1000 mg/d.   Past Medical History:  Diagnosis Date   Anemia    iron infusions   Coronary artery disease    Cough syncope    Diabetes mellitus without complication (HCC)    Fibromyalgia    GERD (gastroesophageal reflux disease)    History of kidney stones    HTN (hypertension)    Hyperlipidemia    Hypertension    Morbid obesity (HCC)    Pneumonia    Sleep apnea    dont tolerate cpap   SOB (shortness of breath)    Uncontrolled type 2 diabetes mellitus with complication ([place])    Vitamin B12 deficiency     Past Surgical History:  Procedure Laterality Date   ELBOW SURGERY Right    HERNIA REPAIR     left and right inguinal , umbilical also   ROUX-EN-Y GASTRIC BYPASS     08-2016  something nicked and pt. had to have 3 units of blood   SHOULDER ARTHROSCOPY Right    TRANSURETHRAL RESECTION OF PROSTATE N/A 03/14/2019   Procedure: TRANSURETHRAL RESECTION OF THE PROSTATE (TURP);  Surgeon: Irine Seal, MD;  Location: [facility];  Service: Urology;  Laterality: N/A;    Social History   Socioeconomic History   Marital status: Married    Spouse name: Not on file   Number of children: Not on file   Years of education: Not on file   Highest education level: Not on file  Occupational History   Not on file  Tobacco Use   Smoking status: Former   Smokeless tobacco: Former    Types: Chew   Tobacco comments:    30 years ago   Vaping Use   Vaping Use: Never used  Substance and Sexual Activity   Alcohol use: No   Drug use: No   Sexual activity: Not Currently  Other Topics Concern   Not on file  Social History Narrative   Not on file   Social Determinants of Health   Financial Resource Strain: Not on file  Food Insecurity: Not on file  Transportation Needs: Not on file  Physical Activity: Not on file  Stress: Not on file  Social Connections: Not on file  Intimate Partner Violence: Not on file    Current Outpatient Medications on File Prior to Visit  Medication Sig Dispense Refill   amLODipine (NORVASC) 10 MG tablet TAKE 1 TABLET BY MOUTH DAILY 60 tablet 11   ARIPiprazole (ABILIFY) 5 MG tablet Take 5 mg by mouth daily.      aspirin EC 81 MG EC tablet Take 1 tablet (81 mg total) by mouth daily. Swallow whole. 30 tablet 11   Blood Glucose Monitoring Suppl (ONETOUCH VERIO) w/Device KIT Use 1-4 times daily as needed/directed DX E11.9 1 kit 0   CALCIUM CITRATE-VITAMIN  D PO Take 1 tablet by mouth 2 (two) times daily. 600 + vit d3     cholecalciferol (VITAMIN D3) 25 MCG (1000 UT) tablet Take 1,000 Units by mouth 2 (two) times daily.     dapagliflozin propanediol (FARXIGA) 10 MG TABS tablet Take 1 tablet (10 mg total) by mouth daily before breakfast. 90 tablet 3   DULoxetine (CYMBALTA) 30 MG capsule Take 30 mg by mouth 3 (three) times daily.      ergocalciferol (VITAMIN D2) 50000 units capsule Take 50,000 Units by mouth 2 (two) times a week. Mon / wed /and Fri     gabapentin (NEURONTIN) 800 MG tablet Take 800 mg by mouth 4 (four) times daily.      iron polysaccharides (NIFEREX) 150 MG capsule Take 1 capsule by mouth 2 (two) times daily.     Lancets (ONETOUCH ULTRASOFT) lancets Use 1-4 times daily as needed/directed  DX E11.9 200 each 12   metoprolol (LOPRESSOR) 100 MG tablet Take 50-100 mg by mouth See admin instructions. 100 mg in the morning, 50 mg at bedtime     mupirocin ointment (BACTROBAN) 2 % Apply 1  application topically 3 (three) times daily.     nortriptyline (PAMELOR) 10 MG capsule Take 3 capsules by mouth at bedtime.     ONETOUCH VERIO test strip Use 1-4 times daily as directed/needed   DX E11.9 200 each 12   oxybutynin (DITROPAN-XL) 10 MG 24 hr tablet Take 10 mg by mouth at bedtime.     pantoprazole (PROTONIX) 40 MG tablet Take 40 mg by mouth daily.      pregabalin (LYRICA) 300 MG capsule Take 300 mg by mouth 2 (two) times daily.      rOPINIRole (REQUIP) 1 MG tablet Take 1-2 mg by mouth at bedtime.      rosuvastatin (CRESTOR) 10 MG tablet Take 10 mg by mouth 3 (three) times a week. M/ wed/ fri     traZODone (DESYREL) 50 MG tablet Take 50 mg by mouth at bedtime as needed for sleep.     TRULICITY 4.5 IR/4.4RX SOPN INJECT 4.5 MG INTO THE SKIN EVERY WEEK 6 mL 3   valsartan (DIOVAN) 160 MG tablet Take 1 tablet (160 mg total) by mouth daily. 30 tablet 11   clotrimazole-betamethasone (LOTRISONE) cream Apply 1 application topically 2 (two) times daily.     potassium chloride SA (KLOR-CON) 20 MEQ tablet Take 1 tablet (20 mEq total) by mouth 2 (two) times daily for 7 days. 14 tablet 0   No current facility-administered medications on file prior to visit.    Allergies  Allergen Reactions   Other     Steroids   Does not tolerate bad for stomach has to take med before and after if uses any steroid    Penicillins Other (See Comments)    Unknown childhood allergic reaction - no other information available    Family History  Problem Relation Age of Onset   Diabetes Father     BP 140/80 (BP Location: Right Arm, Patient Position: Sitting, Cuff Size: Normal)   Pulse 70   Ht 5' 9.5" (1.765 m)   Wt 207 lb 6.4 oz (94.1 kg)   SpO2 95%   BMI 30.19 kg/m    Review of Systems Denies N/V/HB    Objective:   Physical Exam VITAL SIGNS:  See vs page GENERAL: no distress Pulses: dorsalis pedis intact bilat.   MSK: no deformity of the feet CV: 2+ bilat leg edema Skin:  no  ulcer on the feet.   normal color and temp on the feet. Neuro: sensation is intact to touch on the feet Ext: there is bilateral onychomycosis of the toenails.   A1c=6.1%     Assessment & Plan:  Type 2 DM: well-controlled  Patient Instructions  Please continue the same diabetes medications.   check your blood sugar once a day.  vary the time of day when you check, between before the 3 meals, and at bedtime.  also check if you have symptoms of your blood sugar being too high or too low.  please keep a record of the readings and bring it to your next appointment here (or you can bring the meter itself).  You can write it on any piece of paper.  please call us sooner if your blood sugar goes below 70, or if you have a lot of readings over 200.  Please come back for a follow-up appointment in 4 months.

## 2021-03-10 ENCOUNTER — Other Ambulatory Visit: Payer: Commercial Managed Care - PPO

## 2021-03-10 ENCOUNTER — Other Ambulatory Visit (HOSPITAL_COMMUNITY): Payer: Self-pay | Admitting: Physician Assistant

## 2021-03-10 DIAGNOSIS — D644 Congenital dyserythropoietic anemia: Secondary | ICD-10-CM

## 2021-03-10 DIAGNOSIS — Q8789 Other specified congenital malformation syndromes, not elsewhere classified: Secondary | ICD-10-CM

## 2021-03-10 DIAGNOSIS — R634 Abnormal weight loss: Secondary | ICD-10-CM

## 2021-03-10 DIAGNOSIS — M852 Hyperostosis of skull: Secondary | ICD-10-CM

## 2021-03-20 ENCOUNTER — Ambulatory Visit (HOSPITAL_COMMUNITY)
Admission: RE | Admit: 2021-03-20 | Discharge: 2021-03-20 | Disposition: A | Discharge status: Home / Self Care | Payer: Commercial Managed Care - PPO | Source: Ambulatory Visit | Attending: Physician Assistant | Admitting: Physician Assistant

## 2021-03-20 DIAGNOSIS — Q8789 Other specified congenital malformation syndromes, not elsewhere classified: Secondary | ICD-10-CM | POA: Diagnosis present

## 2021-03-20 DIAGNOSIS — K8681 Exocrine pancreatic insufficiency: Secondary | ICD-10-CM | POA: Insufficient documentation

## 2021-03-20 DIAGNOSIS — R634 Abnormal weight loss: Secondary | ICD-10-CM | POA: Insufficient documentation

## 2021-03-20 DIAGNOSIS — D644 Congenital dyserythropoietic anemia: Secondary | ICD-10-CM | POA: Diagnosis present

## 2021-03-20 DIAGNOSIS — M852 Hyperostosis of skull: Secondary | ICD-10-CM | POA: Insufficient documentation

## 2021-03-20 MED ORDER — IOHEXOL 350 MG/ML SOLN
80.0000 mL | Freq: Once | INTRAVENOUS | Status: AC | PRN
  Administered 2021-03-20: 80 mL via INTRAVENOUS

## 2021-03-26 ENCOUNTER — Other Ambulatory Visit: Payer: Self-pay | Admitting: Physician Assistant

## 2021-03-26 DIAGNOSIS — R935 Abnormal findings on diagnostic imaging of other abdominal regions, including retroperitoneum: Secondary | ICD-10-CM

## 2021-04-03 ENCOUNTER — Ambulatory Visit
Admission: RE | Admit: 2021-04-03 | Discharge: 2021-04-03 | Disposition: A | Discharge status: Home / Self Care | Payer: Commercial Managed Care - PPO | Source: Ambulatory Visit | Attending: Physician Assistant | Admitting: Physician Assistant

## 2021-04-03 DIAGNOSIS — R935 Abnormal findings on diagnostic imaging of other abdominal regions, including retroperitoneum: Secondary | ICD-10-CM

## 2021-05-26 ENCOUNTER — Telehealth: Payer: Self-pay

## 2021-05-26 ENCOUNTER — Other Ambulatory Visit (HOSPITAL_COMMUNITY): Payer: Self-pay

## 2021-05-26 NOTE — Telephone Encounter (Signed)
No PA is needed for the Trulicity 4.5mg /0.65ml.  Copay is $105.00 for a 90 day supply

## 2021-06-06 ENCOUNTER — Other Ambulatory Visit: Payer: Self-pay | Admitting: Endocrinology

## 2021-06-06 DIAGNOSIS — Z794 Long term (current) use of insulin: Secondary | ICD-10-CM

## 2021-06-10 ENCOUNTER — Telehealth: Payer: Self-pay | Admitting: Endocrinology

## 2021-06-10 MED ORDER — OZEMPIC (2 MG/DOSE) 8 MG/3ML ~~LOC~~ SOPN: 2.0000 mg | PEN_INJECTOR | SUBCUTANEOUS | 6 refills | Status: DC

## 2021-06-10 NOTE — Telephone Encounter (Signed)
MEDICATION: TRULICITY 4.5 MG/0.5ML SOPN  PHARMACY:   [id number] - [place], [place] - [address] Phone:  [phone]  Fax:  [phone]      HAS THE PATIENT CONTACTED THEIR PHARMACY?  yes  IS THIS A 90 DAY SUPPLY : yes  IS PATIENT OUT OF MEDICATION: yes  IF NOT; HOW MUCH IS LEFT:   LAST APPOINTMENT DATE: @12 /16/2022  NEXT APPOINTMENT DATE:@1 /25/2023  DO WE HAVE YOUR PERMISSION TO LEAVE A DETAILED MESSAGE?:  OTHER COMMENTS: Pharmacy unable to refill PT request an alternate. PT has been out for 3 weeks.   **Let patient know to contact pharmacy at the end of the day to make sure medication is ready. **  ** Please notify patient to allow 48-72 hours to process**  **Encourage patient to contact the pharmacy for refills or they can request refills through [facility]**

## 2021-06-15 ENCOUNTER — Other Ambulatory Visit: Payer: Self-pay | Admitting: Endocrinology

## 2021-06-22 ENCOUNTER — Inpatient Hospital Stay (HOSPITAL_COMMUNITY)
Admission: EM | Admit: 2021-06-22 | Discharge: 2021-06-24 | DRG: 177 | Disposition: A | Discharge status: Home / Self Care | Payer: Commercial Managed Care - PPO | Attending: Family Medicine | Admitting: Family Medicine

## 2021-06-22 ENCOUNTER — Other Ambulatory Visit: Payer: Self-pay

## 2021-06-22 ENCOUNTER — Emergency Department (HOSPITAL_COMMUNITY): Payer: Commercial Managed Care - PPO

## 2021-06-22 DIAGNOSIS — Z9884 Bariatric surgery status: Secondary | ICD-10-CM

## 2021-06-22 DIAGNOSIS — E872 Acidosis, unspecified: Secondary | ICD-10-CM | POA: Diagnosis present

## 2021-06-22 DIAGNOSIS — J9601 Acute respiratory failure with hypoxia: Secondary | ICD-10-CM | POA: Diagnosis present

## 2021-06-22 DIAGNOSIS — G4730 Sleep apnea, unspecified: Secondary | ICD-10-CM | POA: Diagnosis present

## 2021-06-22 DIAGNOSIS — M797 Fibromyalgia: Secondary | ICD-10-CM | POA: Diagnosis present

## 2021-06-22 DIAGNOSIS — R0602 Shortness of breath: Secondary | ICD-10-CM

## 2021-06-22 DIAGNOSIS — I2510 Atherosclerotic heart disease of native coronary artery without angina pectoris: Secondary | ICD-10-CM | POA: Diagnosis present

## 2021-06-22 DIAGNOSIS — D696 Thrombocytopenia, unspecified: Secondary | ICD-10-CM | POA: Diagnosis present

## 2021-06-22 DIAGNOSIS — Z880 Allergy status to penicillin: Secondary | ICD-10-CM

## 2021-06-22 DIAGNOSIS — D582 Other hemoglobinopathies: Secondary | ICD-10-CM | POA: Diagnosis not present

## 2021-06-22 DIAGNOSIS — I10 Essential (primary) hypertension: Secondary | ICD-10-CM | POA: Diagnosis present

## 2021-06-22 DIAGNOSIS — Z7984 Long term (current) use of oral hypoglycemic drugs: Secondary | ICD-10-CM

## 2021-06-22 DIAGNOSIS — K219 Gastro-esophageal reflux disease without esophagitis: Secondary | ICD-10-CM | POA: Diagnosis present

## 2021-06-22 DIAGNOSIS — E1165 Type 2 diabetes mellitus with hyperglycemia: Secondary | ICD-10-CM | POA: Diagnosis present

## 2021-06-22 DIAGNOSIS — Z23 Encounter for immunization: Secondary | ICD-10-CM | POA: Diagnosis not present

## 2021-06-22 DIAGNOSIS — U071 COVID-19: Secondary | ICD-10-CM | POA: Diagnosis present

## 2021-06-22 DIAGNOSIS — Z9079 Acquired absence of other genital organ(s): Secondary | ICD-10-CM | POA: Diagnosis not present

## 2021-06-22 DIAGNOSIS — J9600 Acute respiratory failure, unspecified whether with hypoxia or hypercapnia: Secondary | ICD-10-CM

## 2021-06-22 DIAGNOSIS — Z833 Family history of diabetes mellitus: Secondary | ICD-10-CM

## 2021-06-22 DIAGNOSIS — Z87442 Personal history of urinary calculi: Secondary | ICD-10-CM

## 2021-06-22 DIAGNOSIS — R6510 Systemic inflammatory response syndrome (SIRS) of non-infectious origin without acute organ dysfunction: Secondary | ICD-10-CM

## 2021-06-22 DIAGNOSIS — Z7982 Long term (current) use of aspirin: Secondary | ICD-10-CM | POA: Diagnosis not present

## 2021-06-22 DIAGNOSIS — E669 Obesity, unspecified: Secondary | ICD-10-CM | POA: Diagnosis present

## 2021-06-22 DIAGNOSIS — N400 Enlarged prostate without lower urinary tract symptoms: Secondary | ICD-10-CM | POA: Diagnosis present

## 2021-06-22 DIAGNOSIS — Z6830 Body mass index (BMI) 30.0-30.9, adult: Secondary | ICD-10-CM

## 2021-06-22 DIAGNOSIS — E860 Dehydration: Secondary | ICD-10-CM | POA: Diagnosis present

## 2021-06-22 DIAGNOSIS — Z888 Allergy status to other drugs, medicaments and biological substances status: Secondary | ICD-10-CM

## 2021-06-22 DIAGNOSIS — E871 Hypo-osmolality and hyponatremia: Secondary | ICD-10-CM | POA: Diagnosis present

## 2021-06-22 DIAGNOSIS — E782 Mixed hyperlipidemia: Secondary | ICD-10-CM | POA: Diagnosis present

## 2021-06-22 DIAGNOSIS — R809 Proteinuria, unspecified: Secondary | ICD-10-CM | POA: Diagnosis present

## 2021-06-22 DIAGNOSIS — E1142 Type 2 diabetes mellitus with diabetic polyneuropathy: Secondary | ICD-10-CM

## 2021-06-22 DIAGNOSIS — Z79899 Other long term (current) drug therapy: Secondary | ICD-10-CM

## 2021-06-22 DIAGNOSIS — R824 Acetonuria: Secondary | ICD-10-CM | POA: Diagnosis present

## 2021-06-22 LAB — CBC
HCT: 60.7 % — ABNORMAL HIGH (ref 39.0–52.0)
Hemoglobin: 20.1 g/dL — ABNORMAL HIGH (ref 13.0–17.0)
MCH: 28 pg (ref 26.0–34.0)
MCHC: 33.1 g/dL (ref 30.0–36.0)
MCV: 84.7 fL (ref 80.0–100.0)
Platelets: 124 10*3/uL — ABNORMAL LOW (ref 150–400)
RBC: 7.17 MIL/uL — ABNORMAL HIGH (ref 4.22–5.81)
RDW: 16.7 % — ABNORMAL HIGH (ref 11.5–15.5)
WBC: 7.7 10*3/uL (ref 4.0–10.5)
nRBC: 0 % (ref 0.0–0.2)

## 2021-06-22 LAB — COMPREHENSIVE METABOLIC PANEL
ALT: 32 U/L (ref 0–44)
AST: 30 U/L (ref 15–41)
Albumin: 4.5 g/dL (ref 3.5–5.0)
Alkaline Phosphatase: 74 U/L (ref 38–126)
Anion gap: 16 — ABNORMAL HIGH (ref 5–15)
BUN: 21 mg/dL (ref 8–23)
CO2: 21 mmol/L — ABNORMAL LOW (ref 22–32)
Calcium: 9.2 mg/dL (ref 8.9–10.3)
Chloride: 94 mmol/L — ABNORMAL LOW (ref 98–111)
Creatinine, Ser: 1.08 mg/dL (ref 0.61–1.24)
GFR, Estimated: >60 mL/min (ref >60)
Glucose, Bld: 219 mg/dL — ABNORMAL HIGH (ref 70–99)
Potassium: 4.3 mmol/L (ref 3.5–5.1)
Sodium: 131 mmol/L — ABNORMAL LOW (ref 135–145)
Total Bilirubin: 1.4 mg/dL — ABNORMAL HIGH (ref 0.3–1.2)
Total Protein: 8.5 g/dL — ABNORMAL HIGH (ref 6.5–8.1)

## 2021-06-22 LAB — PROTIME-INR
INR: 1.1 (ref 0.8–1.2)
Prothrombin Time: 14.3 seconds (ref 11.4–15.2)

## 2021-06-22 LAB — RESP PANEL BY RT-PCR (FLU A&B, COVID) ARPGX2
Influenza A by PCR: NEGATIVE
Influenza B by PCR: NEGATIVE
SARS Coronavirus 2 by RT PCR: POSITIVE — AB

## 2021-06-22 LAB — LACTIC ACID, PLASMA: Lactic Acid, Venous: 3.3 mmol/L (ref 0.5–1.9)

## 2021-06-22 LAB — APTT: aPTT: 31 seconds (ref 24–36)

## 2021-06-22 LAB — TROPONIN I (HIGH SENSITIVITY): Troponin I (High Sensitivity): 11 ng/L (ref <18)

## 2021-06-22 MED ORDER — LACTATED RINGERS IV SOLN: INTRAVENOUS | Status: AC

## 2021-06-22 MED ORDER — IPRATROPIUM-ALBUTEROL 0.5-2.5 (3) MG/3ML IN SOLN
3.0000 mL | Freq: Once | RESPIRATORY_TRACT | Status: AC
  Administered 2021-06-22: 3 mL via RESPIRATORY_TRACT
  Filled 2021-06-22: qty 3

## 2021-06-22 MED ORDER — ALBUTEROL SULFATE (2.5 MG/3ML) 0.083% IN NEBU
INHALATION_SOLUTION | RESPIRATORY_TRACT | Status: AC
  Filled 2021-06-22: qty 9

## 2021-06-22 MED ORDER — ALBUTEROL (5 MG/ML) CONTINUOUS INHALATION SOLN: 10.0000 mg | INHALATION_SOLUTION | RESPIRATORY_TRACT | Status: AC

## 2021-06-22 MED ORDER — SODIUM CHLORIDE 0.9 % IV SOLN
2.0000 g | INTRAVENOUS | Status: DC
  Administered 2021-06-22 – 2021-06-23 (×2): 2 g via INTRAVENOUS
  Filled 2021-06-22 (×2): qty 20

## 2021-06-22 MED ORDER — SODIUM CHLORIDE 0.9 % IV BOLUS (SEPSIS)
1000.0000 mL | Freq: Once | INTRAVENOUS | Status: AC
  Administered 2021-06-22: 1000 mL via INTRAVENOUS

## 2021-06-22 MED ORDER — METHYLPREDNISOLONE SODIUM SUCC 125 MG IJ SOLR
125.0000 mg | Freq: Once | INTRAMUSCULAR | Status: AC
  Administered 2021-06-22: 125 mg via INTRAVENOUS
  Filled 2021-06-22: qty 2

## 2021-06-22 MED ORDER — SODIUM CHLORIDE 0.9 % IV SOLN
500.0000 mg | INTRAVENOUS | Status: DC
  Administered 2021-06-22 – 2021-06-23 (×2): 500 mg via INTRAVENOUS
  Filled 2021-06-22 (×2): qty 5

## 2021-06-22 MED ORDER — MAGNESIUM SULFATE 2 GM/50ML IV SOLN
2.0000 g | INTRAVENOUS | Status: AC
  Administered 2021-06-22: 2 g via INTRAVENOUS
  Filled 2021-06-22: qty 50

## 2021-06-22 MED ORDER — NIRMATRELVIR/RITONAVIR (PAXLOVID)TABLET
3.0000 | ORAL_TABLET | Freq: Two times a day (BID) | ORAL | Status: DC
  Administered 2021-06-22 – 2021-06-24 (×4): 3 via ORAL
  Filled 2021-06-22: qty 30

## 2021-06-22 NOTE — ED Provider Notes (Signed)
[facility] EMERGENCY DEPARTMENT Provider Note   CSN: [id number] Arrival date & time: 06/22/21  2034     History  Chief Complaint  Patient presents with   Shortness of Breath    Grant Williams. is a 66 y.o. male.   Shortness of Breath  This patient is a 66 year old male, history of hypertension on Norvasc, history of diabetes on Farxiga, he also takes metoprolol, he takes metformin, he takes Ozempic, he takes valsartan.  The patient presents with shortness of breath, this started on Thursday, it is now 3 days later and his symptoms have gradually been worsening.  He had fevers as high as 102 yesterday with increasing coughing and shortness of breath weakness and inability to get out of the bed for the last 3 days except for a small amount here and there.  His symptoms became so severe tonight that they came to the hospital for further evaluation.  The patient significant other is at the bedside adding to the information stating that he was admitted to the hospital within the last year for severe hypokalemia and hypomagnesemia after a diarrheal illness.  The patient has had multiple sick contacts including his wife who had an upper respiratory illness at the beginning of the week but improved as well as a friend who had vomiting diarrhea and a coughing illness earlier in the week who he was driving in the car with  Home Medications Prior to Admission medications   Medication Sig Start Date End Date Taking? Authorizing Provider  amLODipine (NORVASC) 10 MG tablet TAKE 1 TABLET BY MOUTH DAILY 11/05/20   Lorretta Harp, MD  ARIPiprazole (ABILIFY) 5 MG tablet Take 5 mg by mouth daily.  02/11/14   [provider]  aspirin EC 81 MG EC tablet Take 1 tablet (81 mg total) by mouth daily. Swallow whole. 01/09/21   Barb Merino, MD  Blood Glucose Monitoring Suppl [facility] VERIO) w/Device KIT Use 1-4 times daily as needed/directed DX E11.9 03/28/20   Renato Shin, MD  CALCIUM  CITRATE-VITAMIN D PO Take 1 tablet by mouth 2 (two) times daily. 600 + vit d3    [provider]  cholecalciferol (VITAMIN D3) 25 MCG (1000 UT) tablet Take 1,000 Units by mouth 2 (two) times daily.    [provider]  clotrimazole-betamethasone (LOTRISONE) cream Apply 1 application topically 2 (two) times daily.    [provider]  dapagliflozin propanediol (FARXIGA) 5 MG TABS tablet Take 1 tablet (5 mg total) by mouth daily. 06/11/21   Renato Shin, MD  DULoxetine (CYMBALTA) 30 MG capsule Take 30 mg by mouth 3 (three) times daily.  02/21/14   [provider]  ergocalciferol (VITAMIN D2) 50000 units capsule Take 50,000 Units by mouth 2 (two) times a week. Mon / wed /and Fri    [provider]  FARXIGA 10 MG TABS tablet TAKE 1 TABLET BY MOUTH EVERY DAY BEFORE BREAKFAST 06/17/21   Renato Shin, MD  gabapentin (NEURONTIN) 800 MG tablet Take 800 mg by mouth 4 (four) times daily.     [provider]  iron polysaccharides (NIFEREX) 150 MG capsule Take 1 capsule by mouth 2 (two) times daily. 08/31/17   [provider]  Lancets Glory Rosebush ULTRASOFT) lancets Use 1-4 times daily as needed/directed  DX E11.9 03/28/20   Renato Shin, MD  metFORMIN (GLUCOPHAGE-XR) 500 MG 24 hr tablet Take 2 tablets (1,000 mg total) by mouth daily. 03/06/21   Renato Shin, MD  metoprolol (LOPRESSOR) 100 MG  tablet Take 50-100 mg by mouth See admin instructions. 100 mg in the morning, 50 mg at bedtime    [provider]  mupirocin ointment (BACTROBAN) 2 % Apply 1 application topically 3 (three) times daily.    [provider]  nortriptyline (PAMELOR) 10 MG capsule Take 3 capsules by mouth at bedtime. 11/14/20   [provider]  Roma Schanz test strip Use 1-4 times daily as directed/needed   DX E11.9 03/28/20   Renato Shin, MD  oxybutynin (DITROPAN-XL) 10 MG 24 hr tablet Take 10 mg by mouth at bedtime.    [provider]  pantoprazole  (PROTONIX) 40 MG tablet Take 40 mg by mouth daily.  02/19/14   [provider]  potassium chloride SA (KLOR-CON) 20 MEQ tablet Take 1 tablet (20 mEq total) by mouth 2 (two) times daily for 7 days. 01/08/21 01/15/21  Barb Merino, MD  pregabalin (LYRICA) 300 MG capsule Take 300 mg by mouth 2 (two) times daily.     [provider]  rOPINIRole (REQUIP) 1 MG tablet Take 1-2 mg by mouth at bedtime.  03/03/14   [provider]  rosuvastatin (CRESTOR) 10 MG tablet Take 10 mg by mouth 3 (three) times a week. M/ wed/ fri    [provider]  Semaglutide, 2 MG/DOSE, (OZEMPIC, 2 MG/DOSE,) 8 MG/3ML SOPN Inject 2 mg into the skin once a week. 06/10/21   Shamleffer, Melanie Crazier, MD  traZODone (DESYREL) 50 MG tablet Take 50 mg by mouth at bedtime as needed for sleep.    [provider]  valsartan (DIOVAN) 160 MG tablet Take 1 tablet (160 mg total) by mouth daily. 01/08/21 01/08/22  Barb Merino, MD      Allergies    Other and Penicillins    Review of Systems   Review of Systems  Respiratory:  Positive for shortness of breath.   All other systems reviewed and are negative.  Physical Exam Updated Vital Signs BP 127/72    Pulse (!) 115    Temp 99.4 F (37.4 C) (Oral)    Resp (!) 32    Wt 95.3 kg    SpO2 95%    BMI 30.57 kg/m  Physical Exam Vitals and nursing note reviewed.  Constitutional:      General: He is in acute distress.     Appearance: He is well-developed. He is ill-appearing.  HENT:     Head: Normocephalic and atraumatic.     Mouth/Throat:     Pharynx: No oropharyngeal exudate.  Eyes:     General: No scleral icterus.       Right eye: No discharge.        Left eye: No discharge.     Conjunctiva/sclera: Conjunctivae normal.     Pupils: Pupils are equal, round, and reactive to light.  Neck:     Thyroid: No thyromegaly.     Vascular: No JVD.  Cardiovascular:     Rate and Rhythm: Regular rhythm. Tachycardia present.     Heart sounds: Normal  heart sounds. No murmur heard.   No friction rub. No gallop.  Pulmonary:     Effort: Respiratory distress present.     Breath sounds: Wheezing and rhonchi present. No rales.  Abdominal:     General: Bowel sounds are normal. There is no distension.     Palpations: Abdomen is soft. There is no mass.     Tenderness: There is no abdominal tenderness.  Musculoskeletal:  General: No tenderness. Normal range of motion.     Cervical back: Normal range of motion and neck supple.     Right lower leg: Edema present.     Left lower leg: Edema present.     Comments: 1+ symmetrical lower extremity edema at the pretibial region  Lymphadenopathy:     Cervical: No cervical adenopathy.  Skin:    General: Skin is warm and dry.     Findings: No erythema or rash.  Neurological:     Mental Status: He is alert.     Coordination: Coordination normal.     Comments: The patient is able to move all 4 extremities, he is a little bit weak but able to do everything I asked, speaks in shortened sentences secondary to respiratory distress  Psychiatric:        Behavior: Behavior normal.    ED Results / Procedures / Treatments   Labs (all labs ordered are listed, but only abnormal results are displayed) Labs Reviewed  RESP PANEL BY RT-PCR (FLU A&B, COVID) ARPGX2 - Abnormal; Notable for the following components:      Result Value   SARS Coronavirus 2 by RT PCR POSITIVE (*)    All other components within normal limits  CBC - Abnormal; Notable for the following components:   RBC 7.17 (*)    Hemoglobin 20.1 (*)    HCT 60.7 (*)    RDW 16.7 (*)    Platelets 124 (*)    All other components within normal limits  LACTIC ACID, PLASMA - Abnormal; Notable for the following components:   Lactic Acid, Venous 3.3 (*)    All other components within normal limits  COMPREHENSIVE METABOLIC PANEL - Abnormal; Notable for the following components:   Sodium 131 (*)    Chloride 94 (*)    CO2 21 (*)    Glucose, Bld 219  (*)    Total Protein 8.5 (*)    Total Bilirubin 1.4 (*)    Anion gap 16 (*)    All other components within normal limits  CULTURE, BLOOD (ROUTINE X 2)  CULTURE, BLOOD (ROUTINE X 2)  URINE CULTURE  PROTIME-INR  APTT  LACTIC ACID, PLASMA  URINALYSIS, ROUTINE W REFLEX MICROSCOPIC  TROPONIN I (HIGH SENSITIVITY)  TROPONIN I (HIGH SENSITIVITY)    EKG EKG Interpretation  Date/Time:  Sunday June 22 2021 20:43:22 EST Ventricular Rate:  96 PR Interval:  148 QRS Duration: 92 QT Interval:  356 QTC Calculation: 449 R Axis:   -54 Text Interpretation: Normal sinus rhythm Left axis deviation Abnormal ECG When compared with ECG of 06-Jan-2021 16:34, PREVIOUS ECG IS PRESENT Confirmed by Noemi Chapel [phone]) on 06/22/2021 9:34:28 PM  Radiology DG Chest Port 1 View  Result Date: 06/22/2021 CLINICAL DATA:  Shortness of breath. EXAM: PORTABLE CHEST 1 VIEW COMPARISON:  Chest x-ray 01/06/2021 FINDINGS: There is some strandy and patchy opacities in the left lower lung worrisome for infection. Costophrenic angles are clear. There is no pneumothorax. The heart is mildly enlarged, unchanged. Mediastinal silhouette is stable. Thoracic spinal catheter is present. No acute fractures are seen. IMPRESSION: 1 patchy and strandy opacities in the left lower lung worrisome for infection. Followup PA and lateral chest X-ray is recommended in 3-4 weeks following trial of antibiotic therapy to ensure resolution and exclude underlying malignancy. Electronically Signed   By: Ronney Asters M.D.   On: 06/22/2021 21:14    Procedures .Critical Care Performed by: Noemi Chapel, MD Authorized by: Noemi Chapel, MD  Critical care provider statement:    Critical care time (minutes):  40   Critical care time was exclusive of:  Separately billable procedures and treating other patients   Critical care was necessary to treat or prevent imminent or life-threatening deterioration of the following conditions:  Respiratory  failure   Critical care was time spent personally by me on the following activities:  Development of treatment plan with patient or surrogate, discussions with consultants, evaluation of patient's response to treatment, examination of patient, obtaining history from patient or surrogate, review of old charts, re-evaluation of patient's condition, pulse oximetry, ordering and review of radiographic studies, ordering and review of laboratory studies and ordering and performing treatments and interventions   Care discussed with: admitting provider   Comments:          Medications Ordered in ED Medications  lactated ringers infusion ( Intravenous New Bag/Given 06/22/21 2257)  cefTRIAXone (ROCEPHIN) 2 g in sodium chloride 0.9 % 100 mL IVPB (0 g Intravenous Stopped 06/22/21 2256)  azithromycin (ZITHROMAX) 500 mg in sodium chloride 0.9 % 250 mL IVPB (500 mg Intravenous New Bag/Given 06/22/21 2258)  albuterol (PROVENTIL,VENTOLIN) solution continuous neb ( Nebulization Canceled Entry 06/22/21 2156)  nirmatrelvir/ritonavir EUA (PAXLOVID) 3 tablet (3 tablets Oral Given 06/22/21 2234)  sodium chloride 0.9 % bolus 1,000 mL (0 mLs Intravenous Stopped 06/22/21 2250)  methylPREDNISolone sodium succinate (SOLU-MEDROL) 125 mg/2 mL injection 125 mg (125 mg Intravenous Given 06/22/21 2135)  ipratropium-albuterol (DUONEB) 0.5-2.5 (3) MG/3ML nebulizer solution 3 mL (3 mLs Nebulization Given 06/22/21 2156)  magnesium sulfate IVPB 2 g 50 mL (0 g Intravenous Stopped 06/22/21 2246)  albuterol (PROVENTIL) (2.5 MG/3ML) 0.083% nebulizer solution (  Given 06/22/21 2156)    ED Course/ Medical Decision Making/ A&P                           Medical Decision Making  This patient presents to the ED for concern of shortness of breath fever and a sepsis syndrome, this involves an extensive number of treatment options, and is a complaint that carries with it a high risk of complications and morbidity.  The differential diagnosis includes sepsis,  pneumonia, COVID-19, influenza, COPD exacerbation, the patient does endorse continuing to smoke cigars though he stopped smoking tobacco approximately 40 years ago by cigarette   Co morbidities that complicate the patient evaluation  Diabetes, hypertension, asthma, reactive airway disease, multiple sick exposures   Additional history obtained:  Additional history obtained from electronic medical record and the family member at the bedside, his wife, the patient has a history of multiple visits to his outpatient setting including for control of his diabetes, history of leg swelling, history of potassium deficiency, had been admitted to the hospital in July, discharge summary reviewed. External records from outside source obtained and reviewed including above notes   Lab Tests:  I Ordered, and personally interpreted labs.  The pertinent results include: Significant hemoconcentration with hemoglobin that is very high around 20, hematocrit of 60, sodium of 131, glucose of 219, INR of 1.1, he is COVID positive   Imaging Studies ordered:  I ordered imaging studies including portable chest x-ray I independently visualized and interpreted imaging which showed some patchy opacities, multifocal, could be consistent with pneumonia I agree with the radiologist interpretation   Cardiac Monitoring:  The patient was maintained on a cardiac monitor.  I personally viewed and interpreted the cardiac monitored which showed an underlying rhythm of: Sinus tachycardia  Medicines ordered and prescription drug management:  I ordered medication including Rocephin and Zithromax assuming there is bacterial infection, once x-ray was viewed I added antivirals and Solu-Medrol, he is getting albuterol continuous for wheezing, COVID-19 and a possible sepsis syndrome Reevaluation of the patient after these medicines showed that the patient improved I have reviewed the patients home medicines and have made  adjustments as needed     Critical Interventions:  Albuterol continuous treatment, Solu-Medrol, antivirals, antibiotics Admission to the hospital, fluid resuscitation for dehydration   Consultations Obtained:  I requested consultation with the hospitalist Dr. Josephine Cables,  and discussed lab and imaging findings as well as pertinent plan - they recommend: Admission   Problem List / ED Course:  Fever hypoxia and respiratory distress, likely to be related to COVID-19, antivirals, steroids, breathing treatments all given Dehydration, given multiple courses of IV fluid   Reevaluation:  After the interventions noted above, I reevaluated the patient and found that they have :improved   Social Determinants of Health:  Not applicable   Dispostion:  After consideration of the diagnostic results and the patients response to treatment, I feel that the patent would benefit from admission to the hospital.   Grant Williams. was evaluated in Emergency Department on 06/22/2021 for the symptoms described in the history of present illness. He was evaluated in the context of the global COVID-19 pandemic, which necessitated consideration that the patient might be at risk for infection with the SARS-CoV-2 virus that causes COVID-19. Institutional protocols and algorithms that pertain to the evaluation of patients at risk for COVID-19 are in a state of rapid change based on information released by regulatory bodies including the CDC and federal and state organizations. These policies and algorithms were followed during the patient's care in the ED.         Final Clinical Impression(s) / ED Diagnoses Final diagnoses:  Acute respiratory failure due to COVID-19 [facility])  Dehydration     Noemi Chapel, MD 06/22/21 2259

## 2021-06-22 NOTE — ED Triage Notes (Signed)
Pt arrives with c/o SOB that started today. Per pt, he has had a respiratory illness for the last 3-4 days with fevers, cough, and congestion.

## 2021-06-22 NOTE — H&P (Signed)
History and Physical  Grant Williams. MBW:[id number] DOB: 14-Jan-1956 DOA: 06/22/2021  Referring physician: Noemi Chapel, MD PCP: Vicenta Aly, [place]  Patient coming from: Home  Chief Complaint: Shortness of breath  HPI: Grant Williams. is a 66 y.o. male with medical history significant for  gastric bypass, BPH  (S/P TURP 02/2019), coronary artery disease (cath 2012 without PCI, medical management), hyperlipidemia, hypertension, obesity, diabetes mellitus type 2, gastroesophageal reflux disease who presents to the emergency department due to 3-day onset of flulike symptoms, fever, chills, weakness, cough and shortness of breath in which patient has been spending most time in bed.  Fever was as high as 102F, shortness of breath was seen and tonight, so he came to the ED for further evaluation.  Patient has had sick contact including wife who had upper respiratory tract infection at the beginning of the week and a friend who had diarrhea, vomiting and coughing earlier in the week.  Patient endorsed that he did not get COVID-vaccine or booster and he has not had his flu shot.  ED Course:  In the emergency department, he was tachypneic, tachycardic, BP was 137/85, other vital signs were within normal range.  Work-up in the ED showed elevated H/H at 20.1/60.7, thrombocytopenia, hyponatremia, hyperglycemia, lactic acid 3.3 > 2.6.  Troponin x2 was negative.  Urinalysis was positive for ketonuria and proteinuria.  Influenza A, B was negative.  SARS coronavirus 2 was positive. Chest x-ray showed patchy and strandy opacities in the left lower lung worrisome for infection Breathing treatment was provided, Solu-Medrol was given, patient was started on Paxlovid.  Empiric IV ceftriaxone and azithromycin were started due to presumed CAP, IV hydration was provided.  Hospitalist was asked to admit patient for further evaluation and management.  Review of Systems: A full 10 point Review of Systems was done,  except as stated above, all other Review of systems were negative.   Past Medical History:  Diagnosis Date   Anemia    iron infusions   Coronary artery disease    Cough syncope    Diabetes mellitus without complication (HCC)    Fibromyalgia    GERD (gastroesophageal reflux disease)    History of kidney stones    HTN (hypertension)    Hyperlipidemia    Hypertension    Morbid obesity (HCC)    Pneumonia    Sleep apnea    dont tolerate cpap   SOB (shortness of breath)    Uncontrolled type 2 diabetes mellitus with complication ([place])    Vitamin B12 deficiency    Past Surgical History:  Procedure Laterality Date   ELBOW SURGERY Right    HERNIA REPAIR     left and right inguinal , umbilical also   ROUX-EN-Y GASTRIC BYPASS     08-2016  something nicked and pt. had to have 3 units of blood   SHOULDER ARTHROSCOPY Right    TRANSURETHRAL RESECTION OF PROSTATE N/A 03/14/2019   Procedure: TRANSURETHRAL RESECTION OF THE PROSTATE (TURP);  Surgeon: Irine Seal, MD;  Location: [facility];  Service: Urology;  Laterality: N/A;    Social History:  reports that he has quit smoking. He has quit using smokeless tobacco.  His smokeless tobacco use included chew. He reports that he does not drink alcohol and does not use drugs.   Allergies  Allergen Reactions   Other     Steroids   Does not tolerate bad for stomach has to take med before and after if uses any steroid  Penicillins Other (See Comments)    Unknown childhood allergic reaction - no other information available    Family History  Problem Relation Age of Onset   Diabetes Father      Prior to Admission medications   Medication Sig Start Date End Date Taking? Authorizing Provider  amLODipine (NORVASC) 10 MG tablet TAKE 1 TABLET BY MOUTH DAILY 11/05/20   Lorretta Harp, MD  ARIPiprazole (ABILIFY) 5 MG tablet Take 5 mg by mouth daily.  02/11/14   [provider]  aspirin EC 81 MG EC tablet Take 1 tablet (81 mg total) by mouth  daily. Swallow whole. 01/09/21   Barb Merino, MD  Blood Glucose Monitoring Suppl [facility] VERIO) w/Device KIT Use 1-4 times daily as needed/directed DX E11.9 03/28/20   Renato Shin, MD  CALCIUM CITRATE-VITAMIN D PO Take 1 tablet by mouth 2 (two) times daily. 600 + vit d3    [provider]  cholecalciferol (VITAMIN D3) 25 MCG (1000 UT) tablet Take 1,000 Units by mouth 2 (two) times daily.    [provider]  clotrimazole-betamethasone (LOTRISONE) cream Apply 1 application topically 2 (two) times daily.    [provider]  dapagliflozin propanediol (FARXIGA) 5 MG TABS tablet Take 1 tablet (5 mg total) by mouth daily. 06/11/21   Renato Shin, MD  DULoxetine (CYMBALTA) 30 MG capsule Take 30 mg by mouth 3 (three) times daily.  02/21/14   [provider]  ergocalciferol (VITAMIN D2) 50000 units capsule Take 50,000 Units by mouth 2 (two) times a week. Mon / wed /and Fri    [provider]  FARXIGA 10 MG TABS tablet TAKE 1 TABLET BY MOUTH EVERY DAY BEFORE BREAKFAST 06/17/21   Renato Shin, MD  gabapentin (NEURONTIN) 800 MG tablet Take 800 mg by mouth 4 (four) times daily.     [provider]  iron polysaccharides (NIFEREX) 150 MG capsule Take 1 capsule by mouth 2 (two) times daily. 08/31/17   [provider]  Lancets Glory Rosebush ULTRASOFT) lancets Use 1-4 times daily as needed/directed  DX E11.9 03/28/20   Renato Shin, MD  metFORMIN (GLUCOPHAGE-XR) 500 MG 24 hr tablet Take 2 tablets (1,000 mg total) by mouth daily. 03/06/21   Renato Shin, MD  metoprolol (LOPRESSOR) 100 MG tablet Take 50-100 mg by mouth See admin instructions. 100 mg in the morning, 50 mg at bedtime    [provider]  mupirocin ointment (BACTROBAN) 2 % Apply 1 application topically 3 (three) times daily.    [provider]  nortriptyline (PAMELOR) 10 MG capsule Take 3 capsules by mouth at bedtime. 11/14/20   [provider]  Roma Schanz test strip  Use 1-4 times daily as directed/needed   DX E11.9 03/28/20   Renato Shin, MD  oxybutynin (DITROPAN-XL) 10 MG 24 hr tablet Take 10 mg by mouth at bedtime.    [provider]  pantoprazole (PROTONIX) 40 MG tablet Take 40 mg by mouth daily.  02/19/14   [provider]  potassium chloride SA (KLOR-CON) 20 MEQ tablet Take 1 tablet (20 mEq total) by mouth 2 (two) times daily for 7 days. 01/08/21 01/15/21  Barb Merino, MD  pregabalin (LYRICA) 300 MG capsule Take 300 mg by mouth 2 (two) times daily.     [provider]  rOPINIRole (REQUIP) 1 MG tablet Take 1-2 mg by mouth at bedtime.  03/03/14   [provider]  rosuvastatin (CRESTOR) 10 MG tablet Take 10 mg by mouth 3 (three) times a  week. M/ wed/ fri    [provider]  Semaglutide, 2 MG/DOSE, (OZEMPIC, 2 MG/DOSE,) 8 MG/3ML SOPN Inject 2 mg into the skin once a week. 06/10/21   Shamleffer, Melanie Crazier, MD  traZODone (DESYREL) 50 MG tablet Take 50 mg by mouth at bedtime as needed for sleep.    [provider]  valsartan (DIOVAN) 160 MG tablet Take 1 tablet (160 mg total) by mouth daily. 01/08/21 01/08/22  Barb Merino, MD    Physical Exam: BP (!) 102/56    Pulse 95    Temp 97.6 F (36.4 C) (Oral)    Resp 20    Wt 95.3 kg    SpO2 95%    BMI 30.57 kg/m   General: 66 y.o. year-old male ill appearing, but in no acute distress.  Alert and oriented x3. HEENT: Dry mucous membrane.  NCAT, EOMI Neck: Supple, trachea medial Cardiovascular: Tachycardia.  Regular rate and rhythm with no rubs or gallops.  No thyromegaly or JVD noted.  2/4 pulses in all 4 extremities. Respiratory: Tachypnea.  Diffuse rhonchi on auscultation.   Abdomen: Soft, nontender nondistended with normal bowel sounds x4 quadrants. Muskuloskeletal: Bilateral trace edema in lower extremities.  No cyanosis or clubbing  Neuro: CN II-XII intact, strength 5/5 x 4, sensation, reflexes intact Skin: No ulcerative lesions noted or  rashes Psychiatry: Judgement and insight appear normal. Mood is appropriate for condition and setting          Labs on Admission:  Basic Metabolic Panel: Recent Labs  Lab 06/22/21 2121  NA 131*  K 4.3  CL 94*  CO2 21*  GLUCOSE 219*  BUN 21  CREATININE 1.08  CALCIUM 9.2   Liver Function Tests: Recent Labs  Lab 06/22/21 2121  AST 30  ALT 32  ALKPHOS 74  BILITOT 1.4*  PROT 8.5*  ALBUMIN 4.5   No results for input(s): LIPASE, AMYLASE in the last 168 hours. No results for input(s): AMMONIA in the last 168 hours. CBC: Recent Labs  Lab 06/22/21 2121  WBC 7.7  HGB 20.1*  HCT 60.7*  MCV 84.7  PLT 124*   Cardiac Enzymes: No results for input(s): CKTOTAL, CKMB, CKMBINDEX, TROPONINI in the last 168 hours.  BNP (last 3 results) Recent Labs    01/06/21 1602  BNP 28.3    ProBNP (last 3 results) No results for input(s): PROBNP in the last 8760 hours.  CBG: Recent Labs  Lab 06/23/21 0049  GLUCAP 261*    Radiological Exams on Admission: DG Chest Port 1 View  Result Date: 06/22/2021 CLINICAL DATA:  Shortness of breath. EXAM: PORTABLE CHEST 1 VIEW COMPARISON:  Chest x-ray 01/06/2021 FINDINGS: There is some strandy and patchy opacities in the left lower lung worrisome for infection. Costophrenic angles are clear. There is no pneumothorax. The heart is mildly enlarged, unchanged. Mediastinal silhouette is stable. Thoracic spinal catheter is present. No acute fractures are seen. IMPRESSION: 1 patchy and strandy opacities in the left lower lung worrisome for infection. Followup PA and lateral chest X-ray is recommended in 3-4 weeks following trial of antibiotic therapy to ensure resolution and exclude underlying malignancy. Electronically Signed   By: Ronney Asters M.D.   On: 06/22/2021 21:14    EKG: I independently viewed the EKG done and my findings are as followed: Normal sinus rhythm at a rate of 96 bpm  Assessment/Plan Present on Admission:  COVID-19 virus  infection  Essential hypertension  Principal Problem:   COVID-19 virus infection Active Problems:  Essential hypertension   Acute respiratory failure with hypoxia (HCC)   Elevated hemoglobin (HCC)   Thrombocytopenia (HCC)   Lactic acidosis   Hyponatremia   Hyperglycemia due to diabetes mellitus (HCC)  SIRS due to Acute respiratory failure with hypoxia possibly secondary to COVID-19 virus infection, rule out superimposed CAP POA Patient was tachypneic and tachycardic in the setting of above Continue Atrovent q.6h Continue Solu-Medrol per pharmacy dosing Continue Paxlovid Continue vitamin-C 500 mg p.o. Daily Continue zinc 220 mg p.o. Daily Continue Mucinex, Robitussin and Tussionex Continue Tylenol p.r.n. for fever Continue supplemental oxygen to maintain O2 sat > or = 94% with plan to wean patient off supplemental oxygen as tolerated (of note, patient does not use oxygen at baseline) Continue incentive spirometry and flutter valve q29mn as tolerated Encourage proning, early ambulation, and side laying as tolerated Continue airborne isolation precaution Continue monitoring daily inflammatory markers Patient was started on ceftriaxone and azithromycin, we shall continue same at this time with plan to de-escalate/discontinue based on blood culture, sputum culture, urine Legionella, strep pneumo and procalcitonin  Lactic acidosis Lactic acid 3.3 > 2.6, continue IV hydration, continue to trend lactic acid  Elevated hemoglobin/hematocrit to hemoconcentration H/H20.1/60.7, continue IV hydration  Chronic thrombocytopenia Platelets 124, continue to monitor platelet levels with morning labs  Hyponatremia/dehydration Na 131, continue IV hydration  Essential hypertension BP meds will be held at this time due to soft BP  Hyperglycemia secondary to type II DM Continue ISS and hypoglycemic protocol Continue basal insulin and Tradjenta  CAD/hyperlipidemia Continue aspirin and  statin  GERD Continue Protonix   DVT prophylaxis: Lovenox  Code Status: Full code  Family Communication: Wife at bedside (all questions answered to satisfaction)  Disposition Plan:  Patient is from:                        home Anticipated DC to:                   SNF or family members home Anticipated DC date:               2-3 days Anticipated DC barriers:          Patient requires inpatient management due to severity of illness  Consults called: None  Admission status: Inpatient    OBernadette HoitMD [facility]  06/23/2021, 2:01 AM

## 2021-06-23 ENCOUNTER — Encounter (HOSPITAL_COMMUNITY): Payer: Self-pay | Admitting: Internal Medicine

## 2021-06-23 DIAGNOSIS — D696 Thrombocytopenia, unspecified: Secondary | ICD-10-CM

## 2021-06-23 DIAGNOSIS — D582 Other hemoglobinopathies: Secondary | ICD-10-CM

## 2021-06-23 DIAGNOSIS — R6510 Systemic inflammatory response syndrome (SIRS) of non-infectious origin without acute organ dysfunction: Secondary | ICD-10-CM

## 2021-06-23 DIAGNOSIS — E871 Hypo-osmolality and hyponatremia: Secondary | ICD-10-CM

## 2021-06-23 DIAGNOSIS — E860 Dehydration: Secondary | ICD-10-CM

## 2021-06-23 DIAGNOSIS — E872 Acidosis, unspecified: Secondary | ICD-10-CM

## 2021-06-23 DIAGNOSIS — E1165 Type 2 diabetes mellitus with hyperglycemia: Secondary | ICD-10-CM

## 2021-06-23 LAB — CBC WITH DIFFERENTIAL/PLATELET
Abs Immature Granulocytes: 0.03 10*3/uL (ref 0.00–0.07)
Basophils Absolute: 0 10*3/uL (ref 0.0–0.1)
Basophils Relative: 0 %
Eosinophils Absolute: 0 10*3/uL (ref 0.0–0.5)
Eosinophils Relative: 0 %
HCT: 52.2 % — ABNORMAL HIGH (ref 39.0–52.0)
Hemoglobin: 17 g/dL (ref 13.0–17.0)
Immature Granulocytes: 0 %
Lymphocytes Relative: 3 %
Lymphs Abs: 0.2 10*3/uL — ABNORMAL LOW (ref 0.7–4.0)
MCH: 28.2 pg (ref 26.0–34.0)
MCHC: 32.6 g/dL (ref 30.0–36.0)
MCV: 86.7 fL (ref 80.0–100.0)
Monocytes Absolute: 0.1 10*3/uL (ref 0.1–1.0)
Monocytes Relative: 1 %
Neutro Abs: 6.4 10*3/uL (ref 1.7–7.7)
Neutrophils Relative %: 96 %
Platelets: 107 10*3/uL — ABNORMAL LOW (ref 150–400)
RBC: 6.02 MIL/uL — ABNORMAL HIGH (ref 4.22–5.81)
RDW: 15 % (ref 11.5–15.5)
WBC: 6.8 10*3/uL (ref 4.0–10.5)
nRBC: 0 % (ref 0.0–0.2)

## 2021-06-23 LAB — URINALYSIS, ROUTINE W REFLEX MICROSCOPIC
Bacteria, UA: NONE SEEN
Bilirubin Urine: NEGATIVE
Glucose, UA: >=500 mg/dL — AB
Ketones, ur: 80 mg/dL — AB
Leukocytes,Ua: NEGATIVE
Nitrite: NEGATIVE
Protein, ur: 100 mg/dL — AB
Specific Gravity, Urine: 1.031 — ABNORMAL HIGH (ref 1.005–1.030)
pH: 5 (ref 5.0–8.0)

## 2021-06-23 LAB — COMPREHENSIVE METABOLIC PANEL
ALT: 24 U/L (ref 0–44)
AST: 22 U/L (ref 15–41)
Albumin: 3.4 g/dL — ABNORMAL LOW (ref 3.5–5.0)
Alkaline Phosphatase: 55 U/L (ref 38–126)
Anion gap: 21 — ABNORMAL HIGH (ref 5–15)
BUN: 23 mg/dL (ref 8–23)
CO2: 11 mmol/L — ABNORMAL LOW (ref 22–32)
Calcium: 8.2 mg/dL — ABNORMAL LOW (ref 8.9–10.3)
Chloride: 101 mmol/L (ref 98–111)
Creatinine, Ser: 1.16 mg/dL (ref 0.61–1.24)
GFR, Estimated: >60 mL/min (ref >60)
Glucose, Bld: 271 mg/dL — ABNORMAL HIGH (ref 70–99)
Potassium: 3.7 mmol/L (ref 3.5–5.1)
Sodium: 133 mmol/L — ABNORMAL LOW (ref 135–145)
Total Bilirubin: 1.2 mg/dL (ref 0.3–1.2)
Total Protein: 6.8 g/dL (ref 6.5–8.1)

## 2021-06-23 LAB — D-DIMER, QUANTITATIVE: D-Dimer, Quant: 0.88 ug/mL-FEU — ABNORMAL HIGH (ref 0.00–0.50)

## 2021-06-23 LAB — GLUCOSE, CAPILLARY
Glucose-Capillary: 231 mg/dL — ABNORMAL HIGH (ref 70–99)
Glucose-Capillary: 216 mg/dL — ABNORMAL HIGH (ref 70–99)
Glucose-Capillary: 161 mg/dL — ABNORMAL HIGH (ref 70–99)
Glucose-Capillary: 237 mg/dL — ABNORMAL HIGH (ref 70–99)

## 2021-06-23 LAB — TROPONIN I (HIGH SENSITIVITY): Troponin I (High Sensitivity): 8 ng/L (ref <18)

## 2021-06-23 LAB — STREP PNEUMONIAE URINARY ANTIGEN: Strep Pneumo Urinary Antigen: NEGATIVE

## 2021-06-23 LAB — MAGNESIUM: Magnesium: 2.5 mg/dL — ABNORMAL HIGH (ref 1.7–2.4)

## 2021-06-23 LAB — PHOSPHORUS: Phosphorus: 4 mg/dL (ref 2.5–4.6)

## 2021-06-23 LAB — LACTIC ACID, PLASMA
Lactic Acid, Venous: 2.6 mmol/L (ref 0.5–1.9)
Lactic Acid, Venous: 2.6 mmol/L (ref 0.5–1.9)
Lactic Acid, Venous: 2.3 mmol/L (ref 0.5–1.9)

## 2021-06-23 LAB — C-REACTIVE PROTEIN: CRP: 18.1 mg/dL — ABNORMAL HIGH (ref <1.0)

## 2021-06-23 LAB — CBG MONITORING, ED: Glucose-Capillary: 261 mg/dL — ABNORMAL HIGH (ref 70–99)

## 2021-06-23 LAB — FERRITIN: Ferritin: 265 ng/mL (ref 24–336)

## 2021-06-23 LAB — PROCALCITONIN: Procalcitonin: 0.33 ng/mL

## 2021-06-23 MED ORDER — GUAIFENESIN-DM 100-10 MG/5ML PO SYRP: 10.0000 mL | ORAL_SOLUTION | ORAL | Status: DC | PRN

## 2021-06-23 MED ORDER — METHYLPREDNISOLONE SODIUM SUCC 125 MG IJ SOLR
0.5000 mg/kg | Freq: Two times a day (BID) | INTRAMUSCULAR | Status: DC
  Administered 2021-06-23 – 2021-06-24 (×3): 47.5 mg via INTRAVENOUS
  Filled 2021-06-23 (×3): qty 2

## 2021-06-23 MED ORDER — PREDNISONE 50 MG PO TABS: 50.0000 mg | ORAL_TABLET | Freq: Every day | ORAL | Status: DC

## 2021-06-23 MED ORDER — ASPIRIN EC 81 MG PO TBEC
81.0000 mg | DELAYED_RELEASE_TABLET | Freq: Every day | ORAL | Status: DC
  Administered 2021-06-23 – 2021-06-24 (×2): 81 mg via ORAL
  Filled 2021-06-23 (×2): qty 1

## 2021-06-23 MED ORDER — IPRATROPIUM BROMIDE HFA 17 MCG/ACT IN AERS
2.0000 | INHALATION_SPRAY | Freq: Two times a day (BID) | RESPIRATORY_TRACT | Status: DC
  Filled 2021-06-23: qty 12.9

## 2021-06-23 MED ORDER — INSULIN ASPART 100 UNIT/ML IJ SOLN
0.0000 [IU] | Freq: Three times a day (TID) | INTRAMUSCULAR | Status: DC
  Administered 2021-06-23 (×2): 5 [IU] via SUBCUTANEOUS
  Administered 2021-06-23 – 2021-06-24 (×2): 3 [IU] via SUBCUTANEOUS
  Administered 2021-06-24: 5 [IU] via SUBCUTANEOUS

## 2021-06-23 MED ORDER — LINAGLIPTIN 5 MG PO TABS
5.0000 mg | ORAL_TABLET | Freq: Every day | ORAL | Status: DC
  Administered 2021-06-23 – 2021-06-24 (×2): 5 mg via ORAL
  Filled 2021-06-23 (×2): qty 1

## 2021-06-23 MED ORDER — DM-GUAIFENESIN ER 30-600 MG PO TB12
1.0000 | ORAL_TABLET | Freq: Two times a day (BID) | ORAL | Status: DC
  Administered 2021-06-23 – 2021-06-24 (×4): 1 via ORAL
  Filled 2021-06-23 (×4): qty 1

## 2021-06-23 MED ORDER — PREDNISONE 20 MG PO TABS: 50.0000 mg | ORAL_TABLET | Freq: Every day | ORAL | Status: DC

## 2021-06-23 MED ORDER — ROSUVASTATIN CALCIUM 10 MG PO TABS
10.0000 mg | ORAL_TABLET | ORAL | Status: DC
  Filled 2021-06-23: qty 1

## 2021-06-23 MED ORDER — METHYLPREDNISOLONE SODIUM SUCC 125 MG IJ SOLR: 0.5000 mg/kg | Freq: Two times a day (BID) | INTRAMUSCULAR | Status: DC

## 2021-06-23 MED ORDER — HYDROCOD POLST-CPM POLST ER 10-8 MG/5ML PO SUER: 5.0000 mL | Freq: Two times a day (BID) | ORAL | Status: DC | PRN

## 2021-06-23 MED ORDER — ALBUTEROL SULFATE HFA 108 (90 BASE) MCG/ACT IN AERS: 2.0000 | INHALATION_SPRAY | Freq: Four times a day (QID) | RESPIRATORY_TRACT | Status: DC | PRN

## 2021-06-23 MED ORDER — ACETAMINOPHEN 325 MG PO TABS
650.0000 mg | ORAL_TABLET | Freq: Four times a day (QID) | ORAL | Status: DC | PRN
  Administered 2021-06-24: 650 mg via ORAL
  Filled 2021-06-23: qty 2

## 2021-06-23 MED ORDER — INFLUENZA VAC A&B SA ADJ QUAD 0.5 ML IM PRSY
0.5000 mL | PREFILLED_SYRINGE | INTRAMUSCULAR | Status: AC
  Administered 2021-06-24: 0.5 mL via INTRAMUSCULAR
  Filled 2021-06-23: qty 0.5

## 2021-06-23 MED ORDER — INSULIN ASPART 100 UNIT/ML IJ SOLN
4.0000 [IU] | Freq: Three times a day (TID) | INTRAMUSCULAR | Status: DC
  Administered 2021-06-23 – 2021-06-24 (×5): 4 [IU] via SUBCUTANEOUS

## 2021-06-23 MED ORDER — ZINC SULFATE 220 (50 ZN) MG PO CAPS
220.0000 mg | ORAL_CAPSULE | Freq: Every day | ORAL | Status: DC
  Administered 2021-06-23 – 2021-06-24 (×2): 220 mg via ORAL
  Filled 2021-06-23 (×2): qty 1

## 2021-06-23 MED ORDER — ENOXAPARIN SODIUM 40 MG/0.4ML IJ SOSY
40.0000 mg | PREFILLED_SYRINGE | INTRAMUSCULAR | Status: DC
  Administered 2021-06-23 – 2021-06-24 (×2): 40 mg via SUBCUTANEOUS
  Filled 2021-06-23 (×2): qty 0.4

## 2021-06-23 MED ORDER — ASCORBIC ACID 500 MG PO TABS
500.0000 mg | ORAL_TABLET | Freq: Every day | ORAL | Status: DC
  Administered 2021-06-23 – 2021-06-24 (×2): 500 mg via ORAL
  Filled 2021-06-23 (×2): qty 1

## 2021-06-23 MED ORDER — IPRATROPIUM BROMIDE HFA 17 MCG/ACT IN AERS
2.0000 | INHALATION_SPRAY | Freq: Four times a day (QID) | RESPIRATORY_TRACT | Status: DC
  Administered 2021-06-23: 2 via RESPIRATORY_TRACT
  Filled 2021-06-23 (×2): qty 12.9

## 2021-06-23 MED ORDER — PANTOPRAZOLE SODIUM 40 MG PO TBEC
40.0000 mg | DELAYED_RELEASE_TABLET | Freq: Every day | ORAL | Status: DC
  Administered 2021-06-23 – 2021-06-24 (×2): 40 mg via ORAL
  Filled 2021-06-23 (×2): qty 1

## 2021-06-23 MED ORDER — INSULIN DETEMIR 100 UNIT/ML ~~LOC~~ SOLN
0.1500 [IU]/kg | Freq: Two times a day (BID) | SUBCUTANEOUS | Status: DC
  Administered 2021-06-23 – 2021-06-24 (×4): 14 [IU] via SUBCUTANEOUS
  Filled 2021-06-23 (×7): qty 0.14

## 2021-06-23 NOTE — Progress Notes (Signed)
PROGRESS NOTE     Grant Williams, is a 66 y.o. male, DOB - May 21, 1956, ZOX:[id number]  Admit date - 06/22/2021   Admitting Physician Frankey Shownladapo Adefeso, DO  Outpatient Primary MD for the patient is Elizabeth PalauAnderson, Teresa, FNP  LOS - 1  Chief Complaint  Patient presents with   Shortness of Breath        Brief Narrative:  a 66 y.o. male with medical history significant for  gastric bypass, BPH  (S/P TURP 02/2019), coronary artery disease (cath 2012 without PCI, medical management), hyperlipidemia, hypertension, obesity, diabetes mellitus type 2, gastroesophageal reflux disease admitted on 06/22/2021 with acute hypoxic respiratory failure secondary to COVID infection  Assessment & Plan:   Principal Problem:   COVID-19 virus infection Active Problems:   Mixed hyperlipidemia   Essential hypertension   Acute respiratory failure with hypoxia (HCC)   Elevated hemoglobin (HCC)   Thrombocytopenia (HCC)   Lactic acidosis   Hyponatremia   Hyperglycemia due to diabetes mellitus (HCC)   SIRS (systemic inflammatory response syndrome) (HCC)   Dehydration   1) acute hypoxic respiratory failure secondary to COVID-19 infection-- Continue Atrovent q.6h Continue Solu-Medrol Continue Paxlovid Continue vitamin-C 500 mg p.o. Daily Continue zinc 220 mg p.o. Daily -Try to wean off  -Continue Mucinex, Robitussin and Tussionex  2) hyponatremia/dehydration----hydrate gently  3) HTN--- PTA P BP meds on hold due to soft BP  4)DM2--anticipate worsening glycemic control due to steroids Use Novolog/Humalog Sliding scale insulin with Accu-Cheks/Fingersticks as ordered   5)GERD--continue Protonix especially while on high-dose steroids  6) history of CAD--- continue with aspirin, hold statin while on Paxlovid  Disposition/Need for in-Hospital Stay- patient unable to be discharged at this time due to -acute hypoxic respiratory failure secondary to COVID-19 infection requiring IV steroids and --Possible  discharge home in 1 to 2 days if continues to improve  Status is: Inpatient  Remains inpatient appropriate because:   Disposition: The patient is from: Home              Anticipated d/c is to: Home              Anticipated d/c date is: 1 day              Patient currently is not medically stable to d/c. Barriers: Not Clinically Stable-   Code Status :  -  Code Status: Full Code   Family Communication:    NA (patient is alert, awake and coherent)   Consults  :    DVT Prophylaxis  :   - SCDs  enoxaparin (LOVENOX) injection 40 mg Start: 06/23/21 0600 SCDs Start: 06/23/21 0004    Lab Results  Component Value Date   PLT 107 (L) 06/23/2021    Inpatient Medications  Scheduled Meds:  vitamin C  500 mg Oral Daily   aspirin EC  81 mg Oral Daily   dextromethorphan-guaiFENesin  1 tablet Oral BID   enoxaparin (LOVENOX) injection  40 mg Subcutaneous Q24H   [START ON 06/24/2021] influenza vaccine adjuvanted  0.5 mL Intramuscular Tomorrow-1000   insulin aspart  0-15 Units Subcutaneous TID WC   insulin aspart  4 Units Subcutaneous TID WC   insulin detemir  0.15 Units/kg Subcutaneous BID   [START ON 06/24/2021] ipratropium  2 puff Inhalation BID   linagliptin  5 mg Oral Daily   methylPREDNISolone (SOLU-MEDROL) injection  0.5 mg/kg Intravenous Q12H   Followed by   Melene Muller[START ON 06/26/2021] predniSONE  50 mg Oral Daily   nirmatrelvir/ritonavir EUA  3  tablet Oral BID   pantoprazole  40 mg Oral Daily   zinc sulfate  220 mg Oral Daily   Continuous Infusions:  azithromycin Stopped (06/23/21 0002)   cefTRIAXone (ROCEPHIN)  IV Stopped (06/22/21 2256)   PRN Meds:.acetaminophen, albuterol, chlorpheniramine-HYDROcodone, guaiFENesin-dextromethorphan   Anti-infectives (From admission, onward)    Start     Dose/Rate Route Frequency Ordered Stop   06/22/21 2215  nirmatrelvir/ritonavir EUA (PAXLOVID) 3 tablet        3 tablet Oral 2 times daily 06/22/21 2204 06/27/21 2159   06/22/21 2130  cefTRIAXone  (ROCEPHIN) 2 g in sodium chloride 0.9 % 100 mL IVPB        2 g 200 mL/hr over 30 Minutes Intravenous Every 24 hours 06/22/21 2126 06/27/21 2129   06/22/21 2130  azithromycin (ZITHROMAX) 500 mg in sodium chloride 0.9 % 250 mL IVPB        500 mg 250 mL/hr over 60 Minutes Intravenous Every 24 hours 06/22/21 2126 06/27/21 2129         Subjective: Grant Williams today has no fevers, no emesis,  No chest pain,   - -Cough and dyspnea persist- Hypoxia resolving   Objective: Vitals:   06/23/21 0820 06/23/21 1023 06/23/21 1135 06/23/21 1307  BP:  134/75  (!) 154/75  Pulse:  78 80 81  Resp:  (!) 22 20 20   Temp:  (!) 97.4 F (36.3 C)  97.8 F (36.6 C)  TempSrc:  Oral  Oral  SpO2: 94% 98% 99% 95%  Weight:      Height:        Intake/Output Summary (Last 24 hours) at 06/23/2021 1811 Last data filed at 06/23/2021 1700 Gross per 24 hour  Intake 3340.42 ml  Output 2475 ml  Net 865.42 ml   Filed Weights   06/22/21 2043 06/23/21 0235  Weight: 95.3 kg 90.7 kg    Physical Exam  Gen:- Awake Alert,  in no apparent distress  HEENT:- [place].AT, No sclera icterus Nose- [place] 2L/min Neck-Supple Neck,No JVD,.  Lungs-diminished breath sounds, few scattered wheezes CV- S1, S2 normal, regular  Abd-  +ve B.Sounds, Abd Soft, No tenderness,    Extremity/Skin:- No  edema, pedal pulses present  Psych-affect is appropriate, oriented x3 Neuro-generalized weakness, no new focal deficits, no tremors  Data Reviewed: I have personally reviewed following labs and imaging studies  CBC: Recent Labs  Lab 06/22/21 2121 06/23/21 0227  WBC 7.7 6.8  NEUTROABS  --  6.4  HGB 20.1* 17.0  HCT 60.7* 52.2*  MCV 84.7 86.7  PLT 124* 107*   Basic Metabolic Panel: Recent Labs  Lab 06/22/21 2121 06/23/21 0227  NA 131* 133*  K 4.3 3.7  CL 94* 101  CO2 21* 11*  GLUCOSE 219* 271*  BUN 21 23  CREATININE 1.08 1.16  CALCIUM 9.2 8.2*  MG  --  2.5*  PHOS  --  4.0   GFR: Estimated Creatinine Clearance: 68.8  mL/min (by C-G formula based on SCr of 1.16 mg/dL). Liver Function Tests: Recent Labs  Lab 06/22/21 2121 06/23/21 0227  AST 30 22  ALT 32 24  ALKPHOS 74 55  BILITOT 1.4* 1.2  PROT 8.5* 6.8  ALBUMIN 4.5 3.4*   No results for input(s): LIPASE, AMYLASE in the last 168 hours. No results for input(s): AMMONIA in the last 168 hours. Coagulation Profile: Recent Labs  Lab 06/22/21 2121  INR 1.1   Cardiac Enzymes: No results for input(s): CKTOTAL, CKMB, CKMBINDEX, TROPONINI in the last 168 hours.  BNP (last 3 results) No results for input(s): PROBNP in the last 8760 hours. HbA1C: No results for input(s): HGBA1C in the last 72 hours. CBG: Recent Labs  Lab 06/23/21 0049 06/23/21 0716 06/23/21 1111 06/23/21 1721  GLUCAP 261* 231* 216* 161*   Lipid Profile: No results for input(s): CHOL, HDL, LDLCALC, TRIG, CHOLHDL, LDLDIRECT in the last 72 hours. Thyroid Function Tests: No results for input(s): TSH, T4TOTAL, FREET4, T3FREE, THYROIDAB in the last 72 hours. Anemia Panel: Recent Labs    06/23/21 0227  FERRITIN 265   Urine analysis:    Component Value Date/Time   COLORURINE YELLOW 06/22/2021 2328   APPEARANCEUR CLEAR 06/22/2021 2328   LABSPEC 1.031 (H) 06/22/2021 2328   PHURINE 5.0 06/22/2021 2328   GLUCOSEU >=500 (A) 06/22/2021 2328   HGBUR MODERATE (A) 06/22/2021 2328   BILIRUBINUR NEGATIVE 06/22/2021 2328   KETONESUR 80 (A) 06/22/2021 2328   PROTEINUR 100 (A) 06/22/2021 2328   NITRITE NEGATIVE 06/22/2021 2328   LEUKOCYTESUR NEGATIVE 06/22/2021 2328   Sepsis Labs: @LABRCNTIP (procalcitonin:4,lacticidven:4)  ) Recent Results (from the past 240 hour(s))  Resp Panel by RT-PCR (Flu A&B, Covid) Nasopharyngeal Swab     Status: Abnormal   Collection Time: 06/22/21  8:46 PM   Specimen: Nasopharyngeal Swab; Nasopharyngeal(NP) swabs in vial transport medium  Result Value Ref Range Status   SARS Coronavirus 2 by RT PCR POSITIVE (A) NEGATIVE Final    Comment:  (NOTE) SARS-CoV-2 target nucleic acids are DETECTED.  The SARS-CoV-2 RNA is generally detectable in upper respiratory specimens during the acute phase of infection. Positive results are indicative of the presence of the identified virus, but do not rule out bacterial infection or co-infection with other pathogens not detected by the test. Clinical correlation with patient history and other diagnostic information is necessary to determine patient infection status. The expected result is Negative.  Fact Sheet for Patients: 08/20/21  Fact Sheet for Healthcare Providers: BloggerCourse.com  This test is not yet approved or cleared by the [employer] FDA and  has been authorized for detection and/or diagnosis of SARS-CoV-2 by FDA under an Emergency Use Authorization (EUA).  This EUA will remain in effect (meaning this test can be used) for the duration of  the COVID-19 declaration under Section 564(b)(1) of the A ct, 21 U.S.C. section 360bbb-3(b)(1), unless the authorization is terminated or revoked sooner.     Influenza A by PCR NEGATIVE NEGATIVE Final   Influenza B by PCR NEGATIVE NEGATIVE Final    Comment: (NOTE) The Xpert Xpress SARS-CoV-2/FLU/RSV plus assay is intended as an aid in the diagnosis of influenza from Nasopharyngeal swab specimens and should not be used as a sole basis for treatment. Nasal washings and aspirates are unacceptable for Xpert Xpress SARS-CoV-2/FLU/RSV testing.  Fact Sheet for Patients: [place]  Fact Sheet for Healthcare Providers: BloggerCourse.com  This test is not yet approved or cleared by the [employer] FDA and has been authorized for detection and/or diagnosis of SARS-CoV-2 by FDA under an Emergency Use Authorization (EUA). This EUA will remain in effect (meaning this test can be used) for the duration of the COVID-19  declaration under Section 564(b)(1) of the Act, 21 U.S.C. section 360bbb-3(b)(1), unless the authorization is terminated or revoked.  Performed at [facility], [address], [place], [place]   Blood Culture (routine x 2)     Status: None (Preliminary result)   Collection Time: 06/22/21  9:46 PM   Specimen: BLOOD RIGHT HAND  Result Value Ref Range Status  Specimen Description   Final    BLOOD RIGHT HAND BOTTLES DRAWN AEROBIC AND ANAEROBIC   Special Requests   Final    Blood Culture results may not be optimal due to an inadequate volume of blood received in culture bottles   Culture   Final    NO GROWTH < 24 HOURS Performed at [facility], [address] [place], [place] [address]    Report Status PENDING  Incomplete  Blood Culture (routine x 2)     Status: None (Preliminary result)   Collection Time: 06/22/21  9:46 PM   Specimen: Left Antecubital; Blood  Result Value Ref Range Status   Specimen Description   Final    LEFT ANTECUBITAL BOTTLES DRAWN AEROBIC AND ANAEROBIC   Special Requests Blood Culture adequate volume  Final   Culture   Final    NO GROWTH < 24 HOURS Performed at [facility], [address], [place], [place] [address]    Report Status PENDING  Incomplete      Radiology Studies: DG Chest Port 1 View  Result Date: 06/22/2021 CLINICAL DATA:  Shortness of breath. EXAM: PORTABLE CHEST 1 VIEW COMPARISON:  Chest x-ray 01/06/2021 FINDINGS: There is some strandy and patchy opacities in the left lower lung worrisome for infection. Costophrenic angles are clear. There is no pneumothorax. The heart is mildly enlarged, unchanged. Mediastinal silhouette is stable. Thoracic spinal catheter is present. No acute fractures are seen. IMPRESSION: 1 patchy and strandy opacities in the left lower lung worrisome for infection. Followup PA and lateral chest X-ray is recommended in 3-4 weeks following trial of antibiotic therapy to ensure resolution and exclude underlying  malignancy. Electronically Signed   By: Darliss Cheney M.D.   On: 06/22/2021 21:14     Scheduled Meds:  vitamin C  500 mg Oral Daily   aspirin EC  81 mg Oral Daily   dextromethorphan-guaiFENesin  1 tablet Oral BID   enoxaparin (LOVENOX) injection  40 mg Subcutaneous Q24H   [START ON 06/24/2021] influenza vaccine adjuvanted  0.5 mL Intramuscular Tomorrow-1000   insulin aspart  0-15 Units Subcutaneous TID WC   insulin aspart  4 Units Subcutaneous TID WC   insulin detemir  0.15 Units/kg Subcutaneous BID   [START ON 06/24/2021] ipratropium  2 puff Inhalation BID   linagliptin  5 mg Oral Daily   methylPREDNISolone (SOLU-MEDROL) injection  0.5 mg/kg Intravenous Q12H   Followed by   Melene Muller ON 06/26/2021] predniSONE  50 mg Oral Daily   nirmatrelvir/ritonavir EUA  3 tablet Oral BID   pantoprazole  40 mg Oral Daily   zinc sulfate  220 mg Oral Daily   Continuous Infusions:  azithromycin Stopped (06/23/21 0002)   cefTRIAXone (ROCEPHIN)  IV Stopped (06/22/21 2256)     LOS: 1 day    Shon Hale M.D on 06/23/2021 at 6:11 PM  Go to [email or web address] - for contact info  [facility] - Office  [phone]  If 7PM-7AM, please contact night-coverage [email or web address] Password [facility] 06/23/2021, 6:11 PM

## 2021-06-23 NOTE — Progress Notes (Signed)
Pt being followed by [facility] for Sepsis protocol. 

## 2021-06-23 NOTE — ED Notes (Signed)
Pt's O2 sats 87% on R/A. Pt placed on O2 @2L  . , RN updated on pt's change in O2. Pt transported to floor.

## 2021-06-24 LAB — CBC WITH DIFFERENTIAL/PLATELET
Abs Immature Granulocytes: 0.05 10*3/uL (ref 0.00–0.07)
Basophils Absolute: 0 10*3/uL (ref 0.0–0.1)
Basophils Relative: 0 %
Eosinophils Absolute: 0 10*3/uL (ref 0.0–0.5)
Eosinophils Relative: 0 %
HCT: 51.3 % (ref 39.0–52.0)
Hemoglobin: 17 g/dL (ref 13.0–17.0)
Immature Granulocytes: 1 %
Lymphocytes Relative: 6 %
Lymphs Abs: 0.5 10*3/uL — ABNORMAL LOW (ref 0.7–4.0)
MCH: 28.1 pg (ref 26.0–34.0)
MCHC: 33.1 g/dL (ref 30.0–36.0)
MCV: 84.9 fL (ref 80.0–100.0)
Monocytes Absolute: 0.3 10*3/uL (ref 0.1–1.0)
Monocytes Relative: 3 %
Neutro Abs: 8 10*3/uL — ABNORMAL HIGH (ref 1.7–7.7)
Neutrophils Relative %: 90 %
Platelets: 144 10*3/uL — ABNORMAL LOW (ref 150–400)
RBC: 6.04 MIL/uL — ABNORMAL HIGH (ref 4.22–5.81)
RDW: 15.1 % (ref 11.5–15.5)
WBC: 8.9 10*3/uL (ref 4.0–10.5)
nRBC: 0 % (ref 0.0–0.2)

## 2021-06-24 LAB — URINE CULTURE

## 2021-06-24 LAB — COMPREHENSIVE METABOLIC PANEL
ALT: 26 U/L (ref 0–44)
AST: 26 U/L (ref 15–41)
Albumin: 3.2 g/dL — ABNORMAL LOW (ref 3.5–5.0)
Alkaline Phosphatase: 56 U/L (ref 38–126)
Anion gap: 11 (ref 5–15)
BUN: 26 mg/dL — ABNORMAL HIGH (ref 8–23)
CO2: 21 mmol/L — ABNORMAL LOW (ref 22–32)
Calcium: 8.6 mg/dL — ABNORMAL LOW (ref 8.9–10.3)
Chloride: 106 mmol/L (ref 98–111)
Creatinine, Ser: 0.83 mg/dL (ref 0.61–1.24)
GFR, Estimated: >60 mL/min (ref >60)
Glucose, Bld: 222 mg/dL — ABNORMAL HIGH (ref 70–99)
Potassium: 3.7 mmol/L (ref 3.5–5.1)
Sodium: 138 mmol/L (ref 135–145)
Total Bilirubin: 0.5 mg/dL (ref 0.3–1.2)
Total Protein: 6.5 g/dL (ref 6.5–8.1)

## 2021-06-24 LAB — MAGNESIUM: Magnesium: 2.3 mg/dL (ref 1.7–2.4)

## 2021-06-24 LAB — GLUCOSE, CAPILLARY
Glucose-Capillary: 203 mg/dL — ABNORMAL HIGH (ref 70–99)
Glucose-Capillary: 197 mg/dL — ABNORMAL HIGH (ref 70–99)

## 2021-06-24 LAB — D-DIMER, QUANTITATIVE: D-Dimer, Quant: 0.67 ug/mL-FEU — ABNORMAL HIGH (ref 0.00–0.50)

## 2021-06-24 LAB — PHOSPHORUS: Phosphorus: 3.6 mg/dL (ref 2.5–4.6)

## 2021-06-24 LAB — LEGIONELLA PNEUMOPHILA SEROGP 1 UR AG: L. pneumophila Serogp 1 Ur Ag: NEGATIVE

## 2021-06-24 LAB — C-REACTIVE PROTEIN: CRP: 8.9 mg/dL — ABNORMAL HIGH (ref <1.0)

## 2021-06-24 LAB — FERRITIN: Ferritin: 348 ng/mL — ABNORMAL HIGH (ref 24–336)

## 2021-06-24 MED ORDER — PREDNISONE 20 MG PO TABS: 40.0000 mg | ORAL_TABLET | Freq: Every day | ORAL | 0 refills | Status: AC

## 2021-06-24 MED ORDER — NIRMATRELVIR/RITONAVIR (PAXLOVID)TABLET: 3.0000 | ORAL_TABLET | Freq: Two times a day (BID) | ORAL | 0 refills | Status: AC

## 2021-06-24 MED ORDER — SODIUM CHLORIDE 0.9 % IV SOLN: 1.0000 g | Freq: Once | INTRAVENOUS | Status: DC

## 2021-06-24 MED ORDER — ALBUTEROL SULFATE HFA 108 (90 BASE) MCG/ACT IN AERS: 2.0000 | INHALATION_SPRAY | Freq: Four times a day (QID) | RESPIRATORY_TRACT | 1 refills | Status: AC | PRN

## 2021-06-24 MED ORDER — AZITHROMYCIN 250 MG PO TABS
500.0000 mg | ORAL_TABLET | Freq: Once | ORAL | Status: AC
  Administered 2021-06-24: 500 mg via ORAL
  Filled 2021-06-24: qty 2

## 2021-06-24 MED ORDER — AZITHROMYCIN 250 MG PO TABS: 500.0000 mg | ORAL_TABLET | Freq: Once | ORAL | Status: DC

## 2021-06-24 MED ORDER — IPRATROPIUM BROMIDE HFA 17 MCG/ACT IN AERS: 2.0000 | INHALATION_SPRAY | Freq: Two times a day (BID) | RESPIRATORY_TRACT | 12 refills | Status: AC

## 2021-06-24 MED ORDER — METFORMIN HCL ER (MOD) 1000 MG PO TB24: 1000.0000 mg | ORAL_TABLET | Freq: Two times a day (BID) | ORAL | 4 refills | Status: DC

## 2021-06-24 MED ORDER — ACETAMINOPHEN 325 MG PO TABS: 650.0000 mg | ORAL_TABLET | Freq: Four times a day (QID) | ORAL | 0 refills | Status: AC | PRN

## 2021-06-24 MED ORDER — CEFTRIAXONE SODIUM 1 G IJ SOLR
1.0000 g | Freq: Once | INTRAMUSCULAR | Status: AC
  Administered 2021-06-24: 1 g via INTRAVENOUS
  Filled 2021-06-24: qty 10

## 2021-06-24 MED ORDER — ZINC SULFATE 220 (50 ZN) MG PO CAPS: 220.0000 mg | ORAL_CAPSULE | Freq: Every day | ORAL | 1 refills | Status: DC

## 2021-06-24 MED ORDER — VALSARTAN 160 MG PO TABS
160.0000 mg | ORAL_TABLET | Freq: Every day | ORAL | 5 refills | Status: AC
Start: 2021-06-24 — End: 2024-05-17

## 2021-06-24 MED ORDER — GUAIFENESIN-DM 100-10 MG/5ML PO SYRP: 10.0000 mL | ORAL_SOLUTION | ORAL | 0 refills | Status: DC | PRN

## 2021-06-24 MED ORDER — DM-GUAIFENESIN ER 30-600 MG PO TB12: 1.0000 | ORAL_TABLET | Freq: Two times a day (BID) | ORAL | 0 refills | Status: DC

## 2021-06-24 MED ORDER — ASCORBIC ACID 500 MG PO TABS: 500.0000 mg | ORAL_TABLET | Freq: Every day | ORAL | 1 refills | Status: DC

## 2021-06-24 NOTE — Discharge Instructions (Signed)
1)Avoid ibuprofen/Advil/Aleve/Motrin/Goody Powders/Naproxen/BC powders/Meloxicam/Diclofenac/Indomethacin and other Nonsteroidal anti-inflammatory medications as these will make you more likely to bleed and can cause stomach ulcers, can also cause Kidney problems.   2)You are strongly advised to isolate/quarantine for at least 10 days from the date of your diagnosis with COVID-19 infection--please always wear a mask if you have to go outside the house  3)Please take medications as prescribed--metformin is being increased to 1000 mg twice a day due to high sugars  4)Video/Virtual follow-up visit with primary care physician in about a week advised

## 2021-06-24 NOTE — Discharge Summary (Signed)
Grant Williams., is a 66 y.o. male  DOB Nov 28, 1955  MRN [id number].  Admission date:  06/22/2021  Admitting Physician  Bernadette Hoit, DO  Discharge Date:  06/24/2021   Primary MD  Vicenta Aly, FNP  Recommendations for primary care physician for things to follow:   1)Avoid ibuprofen/Advil/Aleve/Motrin/Goody Powders/Naproxen/BC powders/Meloxicam/Diclofenac/Indomethacin and other Nonsteroidal anti-inflammatory medications as these will make you more likely to bleed and can cause stomach ulcers, can also cause Kidney problems.   2)You are strongly advised to isolate/quarantine for at least 10 days from the date of your diagnosis with COVID-19 infection--please always wear a mask if you have to go outside the house  3)Please take medications as prescribed--metformin is being increased to 1000 mg twice a day due to high sugars  4)Video/Virtual follow-up visit with primary care physician in about a week advised   Admission Diagnosis  Dehydration [E86.0] SOB (shortness of breath) [R06.02] Acute respiratory failure due to COVID-19 ([place]) [U07.1, J96.00] COVID-19 virus infection [U07.1]   Discharge Diagnosis  Dehydration [E86.0] SOB (shortness of breath) [R06.02] Acute respiratory failure due to COVID-19 (HCC) [U07.1, J96.00] COVID-19 virus infection [U07.1]    Principal Problem:   COVID-19 virus infection Active Problems:   Mixed hyperlipidemia   Essential hypertension   Acute respiratory failure with hypoxia (HCC)   Elevated hemoglobin (HCC)   Thrombocytopenia (HCC)   Lactic acidosis   Hyponatremia   Hyperglycemia due to diabetes mellitus (HCC)   SIRS (systemic inflammatory response syndrome) ([place])   Dehydration      Past Medical History:  Diagnosis Date   Anemia    iron infusions   Coronary artery disease    Cough syncope    Diabetes mellitus without complication (HCC)     Fibromyalgia    GERD (gastroesophageal reflux disease)    History of kidney stones    HTN (hypertension)    Hyperlipidemia    Hypertension    Morbid obesity ([place])    Pneumonia    Sleep apnea    dont tolerate cpap   SOB (shortness of breath)    Uncontrolled type 2 diabetes mellitus with complication    Vitamin S92 deficiency     Past Surgical History:  Procedure Laterality Date   ELBOW SURGERY Right    HERNIA REPAIR     left and right inguinal , umbilical also   ROUX-EN-Y GASTRIC BYPASS     08-2016  something nicked and pt. had to have 3 units of blood   SHOULDER ARTHROSCOPY Right    TRANSURETHRAL RESECTION OF PROSTATE N/A 03/14/2019   Procedure: TRANSURETHRAL RESECTION OF THE PROSTATE (TURP);  Surgeon: Irine Seal, MD;  Location: [facility];  Service: Urology;  Laterality: N/A;       HPI  from the history and physical done on the day of admission:    Chief Complaint: Shortness of breath   HPI: Grant Williams. is a 66 y.o. male with medical history significant for  gastric bypass, BPH  (S/P TURP 02/2019), coronary artery  disease (cath 2012 without PCI, medical management), hyperlipidemia, hypertension, obesity, diabetes mellitus type 2, gastroesophageal reflux disease who presents to the emergency department due to 3-day onset of flulike symptoms, fever, chills, weakness, cough and shortness of breath in which patient has been spending most time in bed.  Fever was as high as 102F, shortness of breath was seen and tonight, so he came to the ED for further evaluation.  Patient has had sick contact including wife who had upper respiratory tract infection at the beginning of the week and a friend who had diarrhea, vomiting and coughing earlier in the week.  Patient endorsed that he did not get COVID-vaccine or booster and he has not had his flu shot.   ED Course:  In the emergency department, he was tachypneic, tachycardic, BP was 137/85, other vital signs were within normal range.   Work-up in the ED showed elevated H/H at 20.1/60.7, thrombocytopenia, hyponatremia, hyperglycemia, lactic acid 3.3 > 2.6.  Troponin x2 was negative.  Urinalysis was positive for ketonuria and proteinuria.  Influenza A, B was negative.  SARS coronavirus 2 was positive. Chest x-ray showed patchy and strandy opacities in the left lower lung worrisome for infection Breathing treatment was provided, Solu-Medrol was given, patient was started on Paxlovid.  Empiric IV ceftriaxone and azithromycin were started due to presumed CAP, IV hydration was provided.  Hospitalist was asked to admit patient for further evaluation and management.   Review of Systems: A full 10 point Review of Systems was done, except as stated above, all other Review of systems were negative.     Hospital Course:    Brief Narrative:  a 66 y.o. male with medical history significant for  gastric bypass, BPH  (S/P TURP 02/2019), coronary artery disease (cath 2012 without PCI, medical management), hyperlipidemia, hypertension, obesity, diabetes mellitus type 2, gastroesophageal reflux disease admitted on 06/22/2021 with acute hypoxic respiratory failure secondary to COVID infection  A/p 1) acute hypoxic respiratory failure secondary to COVID-19 infection-- -Improved with Atrovent, IV Solu-Medrol,  Paxlovid, as well as vitamin-C and  zinc Also treated with Mucinex, Robitussin and Tussionex -0 respiratory status improved significantly, hypoxia resolved = Post ambulation O2 sats 96 to 98% on room air   2) hyponatremia/dehydration----sodium normalized with hydration   3) HTN---  resume PTA meds include amlodipine valsartan and metoprolol  4)DM2--glycemic control should improve with de-escalation of steroids -Okay to increase metformin as outlined in discharge instructions while on steroids  5)GERD--continue PPI especially while on high-dose steroids   6) history of CAD--- continue with aspirin, hold statin while on Paxlovid    Disposition-Home  Disposition: The patient is from: Home              Anticipated d/c is to: Home              Code Status :  -  Code Status: Full Code    Family Communication:    NA (patient is alert, awake and coherent)    Consults  :  na  Discharge Condition: Stable  Follow UP--PCP  Diet and Activity recommendation:  As advised  Discharge Instructions    Discharge Instructions     Call MD for:  difficulty breathing, headache or visual disturbances   Complete by: As directed    Call MD for:  persistant dizziness or light-headedness   Complete by: As directed    Call MD for:  persistant nausea and vomiting   Complete by: As directed    Call MD  for:  temperature >100.4   Complete by: As directed    Diet - low sodium heart healthy   Complete by: As directed    Diet Carb Modified   Complete by: As directed    Discharge instructions   Complete by: As directed    1)Avoid ibuprofen/Advil/Aleve/Motrin/Goody Powders/Naproxen/BC powders/Meloxicam/Diclofenac/Indomethacin and other Nonsteroidal anti-inflammatory medications as these will make you more likely to bleed and can cause stomach ulcers, can also cause Kidney problems.   2)You are strongly advised to isolate/quarantine for at least 10 days from the date of your diagnosis with COVID-19 infection--please always wear a mask if you have to go outside the house  3)Please take medications as prescribed--metformin is being increased to 1000 mg twice a day due to high sugars  4)Video/Virtual follow-up visit with primary care physician in about a week advised   Increase activity slowly   Complete by: As directed          Discharge Medications     Allergies as of 06/24/2021       Reactions   Other    Steroids   Does not tolerate bad for stomach has to take med before and after if uses any steroid   Penicillins Other (See Comments)   Unknown childhood allergic reaction - no other information available         Medication List     STOP taking these medications    Ozempic (2 MG/DOSE) 8 MG/3ML Sopn Generic drug: Semaglutide (2 MG/DOSE)   potassium chloride SA 20 MEQ tablet Commonly known as: KLOR-CON M       TAKE these medications    acetaminophen 325 MG tablet Commonly known as: TYLENOL Take 2 tablets (650 mg total) by mouth every 6 (six) hours as needed for mild pain or headache (fever >/= 101).   albuterol 108 (90 Base) MCG/ACT inhaler Commonly known as: VENTOLIN HFA Inhale 2 puffs into the lungs every 6 (six) hours as needed for wheezing or shortness of breath.   amLODipine 10 MG tablet Commonly known as: NORVASC TAKE 1 TABLET BY MOUTH DAILY   ARIPiprazole 5 MG tablet Commonly known as: ABILIFY Take 5 mg by mouth daily.   ascorbic acid 500 MG tablet Commonly known as: VITAMIN C Take 1 tablet (500 mg total) by mouth daily. Start taking on: June 25, 2021   aspirin 81 MG EC tablet Take 1 tablet (81 mg total) by mouth daily. Swallow whole.   CALCIUM CITRATE-VITAMIN D PO Take 1 tablet by mouth 2 (two) times daily. 600 + vit d3   cholecalciferol 25 MCG (1000 UNIT) tablet Commonly known as: VITAMIN D3 Take 1,000 Units by mouth 2 (two) times daily.   DULoxetine 30 MG capsule Commonly known as: CYMBALTA Take 60 mg by mouth daily.   ergocalciferol 1.25 MG (50000 UT) capsule Commonly known as: VITAMIN D2 Take 50,000 Units by mouth 3 (three) times a week. Mon / wed /and Fri   Farxiga 10 MG Tabs tablet Generic drug: dapagliflozin propanediol TAKE 1 TABLET BY MOUTH EVERY DAY BEFORE BREAKFAST What changed:  See the new instructions. Another medication with the same name was removed. Continue taking this medication, and follow the directions you see here.   gabapentin 800 MG tablet Commonly known as: NEURONTIN Take 800 mg by mouth 4 (four) times daily.   guaiFENesin-dextromethorphan 100-10 MG/5ML syrup Commonly known as: ROBITUSSIN DM Take 10 mLs by mouth every 4  (four) hours as needed for cough.   dextromethorphan-guaiFENesin 30-600 MG  12hr tablet Commonly known as: MUCINEX DM Take 1 tablet by mouth 2 (two) times daily.   ipratropium 17 MCG/ACT inhaler Commonly known as: ATROVENT HFA Inhale 2 puffs into the lungs 2 (two) times daily.   iron polysaccharides 150 MG capsule Commonly known as: NIFEREX Take 1 capsule by mouth 2 (two) times daily.   metFORMIN 1000 MG (MOD) 24 hr tablet Commonly known as: GLUMETZA Take 1 tablet (1,000 mg total) by mouth 2 (two) times daily with a meal. What changed:  medication strength when to take this   metoprolol tartrate 100 MG tablet Commonly known as: LOPRESSOR Take 50-100 mg by mouth See admin instructions. 100 mg in the morning, 50 mg at bedtime   nirmatrelvir/ritonavir EUA 20 x 150 MG & 10 x 100MG Tabs Commonly known as: PAXLOVID Take 3 tablets by mouth 2 (two) times daily for 5 days. Take nirmatrelvir (150 mg) two tablets twice daily for 5 days and ritonavir (100 mg) one tablet twice daily for 5 days.   nortriptyline 10 MG capsule Commonly known as: PAMELOR Take 3 capsules by mouth at bedtime.   onetouch ultrasoft lancets Use 1-4 times daily as needed/directed  DX E11.9   OneTouch Verio test strip Generic drug: glucose blood Use 1-4 times daily as directed/needed   DX E11.9   OneTouch Verio w/Device Kit Use 1-4 times daily as needed/directed DX E11.9   oxybutynin 10 MG 24 hr tablet Commonly known as: DITROPAN-XL Take 10 mg by mouth at bedtime.   pantoprazole 40 MG tablet Commonly known as: PROTONIX Take 40 mg by mouth daily.   Potassium Chloride ER 20 MEQ Tbcr Take 1 tablet by mouth daily.   predniSONE 20 MG tablet Commonly known as: DELTASONE Take 2 tablets (40 mg total) by mouth daily with breakfast for 5 days.   pregabalin 300 MG capsule Commonly known as: LYRICA Take 300 mg by mouth 2 (two) times daily.   rOPINIRole 1 MG tablet Commonly known as: REQUIP Take 1-2 mg by  mouth at bedtime.   rosuvastatin 10 MG tablet Commonly known as: CRESTOR Take 10 mg by mouth 3 (three) times a week. M/ wed/ fri   traZODone 50 MG tablet Commonly known as: DESYREL Take 50 mg by mouth at bedtime as needed for sleep.   Trulicity 4.5 IW/8.0HO Sopn Generic drug: Dulaglutide Inject 4.5 mg into the skin once a week.   valsartan 160 MG tablet Commonly known as: Diovan Take 1 tablet (160 mg total) by mouth daily.   zinc sulfate 220 (50 Zn) MG capsule Take 1 capsule (220 mg total) by mouth daily. Start taking on: June 25, 2021        Major procedures and Radiology Reports - PLEASE review detailed and final reports for all details, in brief -   DG Chest Port 1 View  Result Date: 06/22/2021 CLINICAL DATA:  Shortness of breath. EXAM: PORTABLE CHEST 1 VIEW COMPARISON:  Chest x-ray 01/06/2021 FINDINGS: There is some strandy and patchy opacities in the left lower lung worrisome for infection. Costophrenic angles are clear. There is no pneumothorax. The heart is mildly enlarged, unchanged. Mediastinal silhouette is stable. Thoracic spinal catheter is present. No acute fractures are seen. IMPRESSION: 1 patchy and strandy opacities in the left lower lung worrisome for infection. Followup PA and lateral chest X-ray is recommended in 3-4 weeks following trial of antibiotic therapy to ensure resolution and exclude underlying malignancy. Electronically Signed   By: Ronney Asters M.D.   On: 06/22/2021 21:14  Micro Results    Recent Results (from the past 240 hour(s))  Resp Panel by RT-PCR (Flu A&B, Covid) Nasopharyngeal Swab     Status: Abnormal   Collection Time: 06/22/21  8:46 PM   Specimen: Nasopharyngeal Swab; Nasopharyngeal(NP) swabs in vial transport medium  Result Value Ref Range Status   SARS Coronavirus 2 by RT PCR POSITIVE (A) NEGATIVE Final    Comment: (NOTE) SARS-CoV-2 target nucleic acids are DETECTED.  The SARS-CoV-2 RNA is generally detectable in upper  respiratory specimens during the acute phase of infection. Positive results are indicative of the presence of the identified virus, but do not rule out bacterial infection or co-infection with other pathogens not detected by the test. Clinical correlation with patient history and other diagnostic information is necessary to determine patient infection status. The expected result is Negative.  Fact Sheet for Patients: EntrepreneurPulse.com.au  Fact Sheet for Healthcare Providers: IncredibleEmployment.be  This test is not yet approved or cleared by the [place] FDA and  has been authorized for detection and/or diagnosis of SARS-CoV-2 by FDA under an Emergency Use Authorization (EUA).  This EUA will remain in effect (meaning this test can be used) for the duration of  the COVID-19 declaration under Section 564(b)(1) of the A ct, 21 U.S.C. section 360bbb-3(b)(1), unless the authorization is terminated or revoked sooner.     Influenza A by PCR NEGATIVE NEGATIVE Final   Influenza B by PCR NEGATIVE NEGATIVE Final    Comment: (NOTE) The Xpert Xpress SARS-CoV-2/FLU/RSV plus assay is intended as an aid in the diagnosis of influenza from Nasopharyngeal swab specimens and should not be used as a sole basis for treatment. Nasal washings and aspirates are unacceptable for Xpert Xpress SARS-CoV-2/FLU/RSV testing.  Fact Sheet for Patients: EntrepreneurPulse.com.au  Fact Sheet for Healthcare Providers: IncredibleEmployment.be  This test is not yet approved or cleared by the [place] FDA and has been authorized for detection and/or diagnosis of SARS-CoV-2 by FDA under an Emergency Use Authorization (EUA). This EUA will remain in effect (meaning this test can be used) for the duration of the COVID-19 declaration under Section 564(b)(1) of the Act, 21 U.S.C. section 360bbb-3(b)(1), unless the authorization is  terminated or revoked.  Performed at [facility], [address], [place], [place] [address]   Blood Culture (routine x 2)     Status: None (Preliminary result)   Collection Time: 06/22/21  9:46 PM   Specimen: BLOOD RIGHT HAND  Result Value Ref Range Status   Specimen Description   Final    BLOOD RIGHT HAND BOTTLES DRAWN AEROBIC AND ANAEROBIC   Special Requests   Final    Blood Culture results may not be optimal due to an inadequate volume of blood received in culture bottles   Culture   Final    NO GROWTH 2 DAYS Performed at [facility], [address] [place], [place] [address]    Report Status PENDING  Incomplete  Blood Culture (routine x 2)     Status: None (Preliminary result)   Collection Time: 06/22/21  9:46 PM   Specimen: Left Antecubital; Blood  Result Value Ref Range Status   Specimen Description   Final    LEFT ANTECUBITAL BOTTLES DRAWN AEROBIC AND ANAEROBIC   Special Requests Blood Culture adequate volume  Final   Culture   Final    NO GROWTH 2 DAYS Performed at [facility], [address], [place], [place] [address]    Report Status PENDING  Incomplete  Urine Culture  Status: Abnormal   Collection Time: 06/22/21 11:28 PM   Specimen: In/Out Cath Urine  Result Value Ref Range Status   Specimen Description   Final    IN/OUT CATH URINE Performed at [facility], [address] [place], [place] [address]    Special Requests   Final    NONE Performed at [facility], [address] [place], [place] [address]    Culture MULTIPLE SPECIES PRESENT, SUGGEST RECOLLECTION (A)  Final   Report Status 06/24/2021 FINAL  Final    Today   Subjective    [place] today has no new complaints -respiratory status improved significantly, hypoxia resolved = Post ambulation O2 sats 96 to 98% on room air No fever  Or chills   Patient has been seen and examined prior to discharge   Objective   Blood pressure 131/63, pulse (!) 58, temperature 97.7 F (36.5 C),  temperature source Oral, resp. rate 18, height 5' 9" (1.753 m), weight 90.7 kg, SpO2 94 %.   Intake/Output Summary (Last 24 hours) at 06/24/2021 1540 Last data filed at 06/24/2021 1531 Gross per 24 hour  Intake 930 ml  Output 550 ml  Net 380 ml   Exam Gen:- Awake Alert, no acute distress , speaking in complete sentences HEENT:- [place].AT, No sclera icterus Neck-Supple Neck,No JVD,.  Lungs-much improved air movement, no wheezing, no rhonchi CV- S1, S2 normal, regular Abd-  +ve B.Sounds, Abd Soft, No tenderness,    Extremity/Skin:- No  edema,   good pulses Psych-affect is appropriate, oriented x3 Neuro-no new focal deficits, no tremors    Data Review   CBC w Diff:  Lab Results  Component Value Date   WBC 8.9 06/24/2021   HGB 17.0 06/24/2021   HGB 15.7 08/01/2010   HCT 51.3 06/24/2021   HCT 46.7 08/01/2010   PLT 144 (L) 06/24/2021   PLT 173 08/01/2010   LYMPHOPCT 6 06/24/2021   LYMPHOPCT 29.8 08/01/2010   MONOPCT 3 06/24/2021   MONOPCT 7.6 08/01/2010   EOSPCT 0 06/24/2021   EOSPCT 5.6 08/01/2010   BASOPCT 0 06/24/2021   BASOPCT 0.8 08/01/2010    CMP:  Lab Results  Component Value Date   NA 138 06/24/2021   K 3.7 06/24/2021   CL 106 06/24/2021   CO2 21 (L) 06/24/2021   BUN 26 (H) 06/24/2021   CREATININE 0.83 06/24/2021   PROT 6.5 06/24/2021   ALBUMIN 3.2 (L) 06/24/2021   BILITOT 0.5 06/24/2021   ALKPHOS 56 06/24/2021   AST 26 06/24/2021   ALT 26 06/24/2021  .   Total Discharge time is about 33 minutes  Roxan Hockey M.D on 06/24/2021 at 3:40 PM  Go to [email or web address] -  for contact info  [facility] - Office  [phone]

## 2021-06-24 NOTE — Progress Notes (Signed)
Inpatient Diabetes Program Recommendations  AACE/ADA: New Consensus Statement on Inpatient Glycemic Control   Target Ranges:  Prepandial:   less than 140 mg/dL      Peak postprandial:   less than 180 mg/dL (1-2 hours)      Critically ill patients:  140 - 180 mg/dL    Latest Reference Range & Units 06/23/21 07:16 06/23/21 11:11 06/23/21 17:21 06/23/21 20:10 06/24/21 07:43  Glucose-Capillary 70 - 99 mg/dL 622 (H) 297 (H) 989 (H) 237 (H) 203 (H)   Review of Glycemic Control  Diabetes history: DM2 Outpatient Diabetes medications:Farxiga 10 mg daily, Metformin XR 1000 mg daily, Trulicity 4.5 mg Qweek Current orders for Inpatient glycemic control: Levemir 14 units BID, Novolog 0-15 units TID, Novolog 4 units TID with meals, Tradjenta 5 mg daily; Solumedrol 47.5 mg Q12H  Inpatient Diabetes Program Recommendations:    Insulin: If steroids are continued, please consider increasing Levemir to 16 units BID and meal coverage to Novolog 6 units TID with meals if patient eats at least 50% of meals.  Thanks, Orlando Penner, RN, MSN, CDE Diabetes Coordinator Inpatient Diabetes Program [phone] (Team Pager from 8am to 5pm)

## 2021-06-27 LAB — CULTURE, BLOOD (ROUTINE X 2)
Culture: NO GROWTH
Culture: NO GROWTH
Special Requests: ADEQUATE

## 2021-07-06 ENCOUNTER — Other Ambulatory Visit: Payer: Self-pay | Admitting: Endocrinology

## 2021-07-16 ENCOUNTER — Ambulatory Visit (INDEPENDENT_AMBULATORY_CARE_PROVIDER_SITE_OTHER): Payer: Commercial Managed Care - PPO | Admitting: Endocrinology

## 2021-07-16 ENCOUNTER — Other Ambulatory Visit: Payer: Self-pay

## 2021-07-16 VITALS — BP 144/90 | HR 76 | Ht 69.0 in | Wt 203.4 lb

## 2021-07-16 DIAGNOSIS — E119 Type 2 diabetes mellitus without complications: Secondary | ICD-10-CM | POA: Diagnosis not present

## 2021-07-16 DIAGNOSIS — Z794 Long term (current) use of insulin: Secondary | ICD-10-CM | POA: Diagnosis not present

## 2021-07-16 LAB — POCT GLYCOSYLATED HEMOGLOBIN (HGB A1C): Hemoglobin A1C: 9.4 % — AB (ref 4.0–5.6)

## 2021-07-16 MED ORDER — METFORMIN HCL ER 500 MG PO TB24: 2000.0000 mg | ORAL_TABLET | Freq: Every day | ORAL | 3 refills | Status: DC

## 2021-07-16 NOTE — Progress Notes (Signed)
Subjective:    Patient ID: Grant Williams., male    DOB: Sep 29, 1955, 66 y.o.   MRN: [id number]  HPI Pt returns for f/u of diabetes mellitus:  DM type: 2. Dx'ed: 2008.   Complications: PN, DN, and CAD.   Therapy: Ozempic and 2 oral meds.   DKA: never.  Severe hypoglycemia: never.   Pancreatitis: never.    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   Other: He took insulin 2015-2018; he had gastric bypass surgery in 2018.   Interval history: pt says cbg's vary from 110-160.  No new sxs.  He takes meds as rx'ed.  Pt says he received steroids 12/22.  He also went 6 weeks without Trulicity, due to availability.  Since then, cbg's have improved to 70-145.   Past Medical History:  Diagnosis Date   Anemia    iron infusions   Coronary artery disease    Cough syncope    Diabetes mellitus without complication (HCC)    Fibromyalgia    GERD (gastroesophageal reflux disease)    History of kidney stones    HTN (hypertension)    Hyperlipidemia    Hypertension    Morbid obesity (HCC)    Pneumonia    Sleep apnea    dont tolerate cpap   SOB (shortness of breath)    Uncontrolled type 2 diabetes mellitus with complication    Vitamin J88 deficiency     Past Surgical History:  Procedure Laterality Date   ELBOW SURGERY Right    HERNIA REPAIR     left and right inguinal , umbilical also   ROUX-EN-Y GASTRIC BYPASS     08-2016  something nicked and pt. had to have 3 units of blood   SHOULDER ARTHROSCOPY Right    TRANSURETHRAL RESECTION OF PROSTATE N/A 03/14/2019   Procedure: TRANSURETHRAL RESECTION OF THE PROSTATE (TURP);  Surgeon: Irine Seal, MD;  Location: [facility];  Service: Urology;  Laterality: N/A;    Social History   Socioeconomic History   Marital status: Married    Spouse name: Not on file   Number of children: Not on file   Years of education: Not on file   Highest education level: Not on file  Occupational History   Not on file  Tobacco Use    Smoking status: Former   Smokeless tobacco: Former    Types: Chew   Tobacco comments:    30 years ago  Vaping Use   Vaping Use: Never used  Substance and Sexual Activity   Alcohol use: No   Drug use: No   Sexual activity: Not Currently  Other Topics Concern   Not on file  Social History Narrative   Not on file   Social Determinants of Health   Financial Resource Strain: Not on file  Food Insecurity: Not on file  Transportation Needs: Not on file  Physical Activity: Not on file  Stress: Not on file  Social Connections: Not on file  Intimate Partner Violence: Not on file    Current Outpatient Medications on File Prior to Visit  Medication Sig Dispense Refill   acetaminophen (TYLENOL) 325 MG tablet Take 2 tablets (650 mg total) by mouth every 6 (six) hours as needed for mild pain or headache (fever >/= 101). 12 tablet 0   albuterol (VENTOLIN HFA) 108 (90 Base) MCG/ACT inhaler Inhale 2 puffs into the lungs every 6 (six) hours as needed for wheezing or shortness of breath. 18 g  1   amLODipine (NORVASC) 10 MG tablet TAKE 1 TABLET BY MOUTH DAILY (Patient taking differently: Take 10 mg by mouth daily.) 60 tablet 11   ARIPiprazole (ABILIFY) 5 MG tablet Take 5 mg by mouth daily.      ascorbic acid (VITAMIN C) 500 MG tablet Take 1 tablet (500 mg total) by mouth daily. 30 tablet 1   aspirin EC 81 MG EC tablet Take 1 tablet (81 mg total) by mouth daily. Swallow whole. 30 tablet 11   Blood Glucose Monitoring Suppl (ONETOUCH VERIO) w/Device KIT Use 1-4 times daily as needed/directed DX E11.9 1 kit 0   CALCIUM CITRATE-VITAMIN D PO Take 1 tablet by mouth 2 (two) times daily. 600 + vit d3     cholecalciferol (VITAMIN D3) 25 MCG (1000 UT) tablet Take 1,000 Units by mouth 2 (two) times daily.     dextromethorphan-guaiFENesin (MUCINEX DM) 30-600 MG 12hr tablet Take 1 tablet by mouth 2 (two) times daily. 20 tablet 0   DULoxetine (CYMBALTA) 30 MG capsule Take 60 mg by mouth daily.     ergocalciferol  (VITAMIN D2) 50000 units capsule Take 50,000 Units by mouth 3 (three) times a week. Mon / wed /and Fri     FARXIGA 10 MG TABS tablet TAKE 1 TABLET BY MOUTH EVERY DAY BEFORE BREAKFAST (Patient taking differently: Take 10 mg by mouth daily.) 90 tablet 3   gabapentin (NEURONTIN) 800 MG tablet Take 800 mg by mouth 4 (four) times daily.      guaiFENesin-dextromethorphan (ROBITUSSIN DM) 100-10 MG/5ML syrup Take 10 mLs by mouth every 4 (four) hours as needed for cough. 118 mL 0   ipratropium (ATROVENT HFA) 17 MCG/ACT inhaler Inhale 2 puffs into the lungs 2 (two) times daily. 1 each 12   iron polysaccharides (NIFEREX) 150 MG capsule Take 1 capsule by mouth 2 (two) times daily.     Lancets (ONETOUCH ULTRASOFT) lancets Use 1-4 times daily as needed/directed  DX E11.9 200 each 12   metoprolol (LOPRESSOR) 100 MG tablet Take 50-100 mg by mouth See admin instructions. 100 mg in the morning, 50 mg at bedtime     nortriptyline (PAMELOR) 10 MG capsule Take 3 capsules by mouth at bedtime.     ONETOUCH VERIO test strip Use 1-4 times daily as directed/needed   DX E11.9 200 each 12   oxybutynin (DITROPAN-XL) 10 MG 24 hr tablet Take 10 mg by mouth at bedtime.     pantoprazole (PROTONIX) 40 MG tablet Take 40 mg by mouth daily.      Potassium Chloride ER 20 MEQ TBCR Take 1 tablet by mouth daily.     pregabalin (LYRICA) 300 MG capsule Take 300 mg by mouth 2 (two) times daily.      rOPINIRole (REQUIP) 1 MG tablet Take 1-2 mg by mouth at bedtime.      rosuvastatin (CRESTOR) 10 MG tablet Take 10 mg by mouth 3 (three) times a week. M/ wed/ fri     traZODone (DESYREL) 50 MG tablet Take 50 mg by mouth at bedtime as needed for sleep.     TRULICITY 4.5 CH/8.8FO SOPN INJECT 4.5 MG SUBCUTANEOUSLY EVERY WEEK 4 mL 0   valsartan (DIOVAN) 160 MG tablet Take 1 tablet (160 mg total) by mouth daily. 30 tablet 5   zinc sulfate 220 (50 Zn) MG capsule Take 1 capsule (220 mg total) by mouth daily. 30 capsule 1   No current  facility-administered medications on file prior to visit.    Allergies  Allergen Reactions   Other     Steroids   Does not tolerate bad for stomach has to take med before and after if uses any steroid    Penicillins Other (See Comments)    Unknown childhood allergic reaction - no other information available    Family History  Problem Relation Age of Onset   Diabetes Father     BP (!) 144/90    Pulse 76    Ht _0  (1.753 m)    Wt 203 lb 6.4 oz (92.3 kg)    SpO2 97%    BMI 30.04 kg/m    Review of Systems Denies N/V/HB    Objective:   Physical Exam VITAL SIGNS:  See vs page GENERAL: no distress Pulses: dorsalis pedis intact bilat.   MSK: no deformity of the feet CV: 1+ bilat leg edema Skin:  no ulcer on the feet.  normal color and temp on the feet. Neuro: sensation is intact to touch on the feet Ext: there is bilateral onychomycosis of the toenails.    A1c=9.4%    Assessment & Plan:  Type 2 DM: uncontrolled.  However, due to poss recent improvement, we'll check fructosamine.    Patient Instructions  Please continue the same diabetes medications.   Blood tests are requested for you today.  We'll let you know about the results.   check your blood sugar once a day.  vary the time of day when you check, between before the 3 meals, and at bedtime.  also check if you have symptoms of your blood sugar being too high or too low.  please keep a record of the readings and bring it to your next appointment here (or you can bring the meter itself).  You can write it on any piece of paper.  please call us sooner if your blood sugar goes below 70, or if you have a lot of readings over 200.  Please come back for a follow-up appointment in 6 months.

## 2021-07-16 NOTE — Patient Instructions (Addendum)
Please continue the same diabetes medications.   Blood tests are requested for you today.  We'll let you know about the results.   check your blood sugar once a day.  vary the time of day when you check, between before the 3 meals, and at bedtime.  also check if you have symptoms of your blood sugar being too high or too low.  please keep a record of the readings and bring it to your next appointment here (or you can bring the meter itself).  You can write it on any piece of paper.  please call us sooner if your blood sugar goes below 70, or if you have a lot of readings over 200.   Please come back for a follow-up appointment in 6 months.   

## 2021-07-21 ENCOUNTER — Encounter (HOSPITAL_COMMUNITY): Payer: Self-pay | Admitting: Radiology

## 2021-07-22 LAB — FRUCTOSAMINE: Fructosamine: 335 umol/L — ABNORMAL HIGH (ref 205–285)

## 2021-08-11 ENCOUNTER — Telehealth: Payer: Self-pay

## 2021-08-11 NOTE — Telephone Encounter (Signed)
LVM for pt's wife to give me a cb regarding her husband's Rx Farxiga 10mg . I also called husband as well so I just need to speak with whoever calls me back to get some clarification on Rx.

## 2021-09-03 ENCOUNTER — Other Ambulatory Visit: Payer: Self-pay | Admitting: Endocrinology

## 2021-09-03 DIAGNOSIS — E119 Type 2 diabetes mellitus without complications: Secondary | ICD-10-CM

## 2021-11-10 ENCOUNTER — Ambulatory Visit: Payer: Commercial Managed Care - PPO | Admitting: Endocrinology

## 2021-11-11 ENCOUNTER — Other Ambulatory Visit: Payer: Self-pay | Admitting: Cardiovascular Disease

## 2021-12-02 NOTE — Progress Notes (Unsigned)
Patient ID: Grant Williams., male   DOB: October 12, 1955, 66 y.o.   MRN: [id number]           Reason for Appointment: Type II Diabetes follow-up   History of Present Illness   Diagnosis date:  2008   Previous history: He was apparently taking metformin 2000 mg, Amaryl for a few years before being referred here He was started on insulin when his A1c was 11% compared to 9% prior to that His PCP also tried him on Farxiga but this caused very frequent urination and he could not tolerate the 5 mg dose  He took insulin 2015-2018; he had gastric bypass surgery in 2018.   Oral hypoglycemic drugs previously used are: Insulin was started in  A1c range in the last few years is:  Recent history:     Non-insulin hypoglycemic drugs:      Insulin regimen:            Side effects from medications: None  Current self management, blood sugar patterns and problems identified:  A1c is  Exercise: Diet management:      Monitors blood glucose: Once a day.    Glucometer: One Touch.           Blood Glucose readings from meter download:   PRE-MEAL Fasting Lunch Dinner Bedtime Overall  Glucose range:       Mean/median:        POST-MEAL PC Breakfast PC Lunch PC Dinner  Glucose range:     Mean/median:        Hypoglycemia:  none                        Dietician visit: Most recent:      Weight control:  Wt Readings from Last 3 Encounters:  07/16/21 203 lb 6.4 oz (92.3 kg)  06/23/21 199 lb 15.3 oz (90.7 kg)  03/06/21 207 lb 6.4 oz (94.1 kg)            Diabetes labs:  Lab Results  Component Value Date   HGBA1C 9.4 (A) 07/16/2021   HGBA1C 6.1 (A) 03/06/2021   HGBA1C 6.3 (H) 01/07/2021   Lab Results  Component Value Date   MICROALBUR 120.9 (H) 12/17/2014   LDLCALC 25 01/07/2021   CREATININE 0.83 06/24/2021     Allergies as of 12/03/2021       Reactions   Other    Steroids   Does not tolerate bad for stomach has to take med before and after if uses any steroid    Penicillins Other (See Comments)   Unknown childhood allergic reaction - no other information available        Medication List        Accurate as of December 02, 2021  8:59 PM. If you have any questions, ask your nurse or doctor.          acetaminophen 325 MG tablet Commonly known as: TYLENOL Take 2 tablets (650 mg total) by mouth every 6 (six) hours as needed for mild pain or headache (fever >/= 101).   albuterol 108 (90 Base) MCG/ACT inhaler Commonly known as: VENTOLIN HFA Inhale 2 puffs into the lungs every 6 (six) hours as needed for wheezing or shortness of breath.   amLODipine 10 MG tablet Commonly known as: NORVASC Take 1 tablet (10 mg total) by mouth daily. PATIENT MUST SCHEDULE APPOINTMENT FOR FUTURE REFILLS   ARIPiprazole 5 MG tablet Commonly known as: ABILIFY Take 5 mg  by mouth daily.   ascorbic acid 500 MG tablet Commonly known as: VITAMIN C Take 1 tablet (500 mg total) by mouth daily.   aspirin EC 81 MG tablet Take 1 tablet (81 mg total) by mouth daily. Swallow whole.   CALCIUM CITRATE-VITAMIN D PO Take 1 tablet by mouth 2 (two) times daily. 600 + vit d3   cholecalciferol 25 MCG (1000 UNIT) tablet Commonly known as: VITAMIN D3 Take 1,000 Units by mouth 2 (two) times daily.   DULoxetine 30 MG capsule Commonly known as: CYMBALTA Take 60 mg by mouth daily.   ergocalciferol 1.25 MG (50000 UT) capsule Commonly known as: VITAMIN D2 Take 50,000 Units by mouth 3 (three) times a week. Mon / wed /and Fri   Farxiga 10 MG Tabs tablet Generic drug: dapagliflozin propanediol TAKE 1 TABLET BY MOUTH EVERY DAY BEFORE BREAKFAST What changed: See the new instructions.   gabapentin 800 MG tablet Commonly known as: NEURONTIN Take 800 mg by mouth 4 (four) times daily.   guaiFENesin-dextromethorphan 100-10 MG/5ML syrup Commonly known as: ROBITUSSIN DM Take 10 mLs by mouth every 4 (four) hours as needed for cough.   dextromethorphan-guaiFENesin 30-600 MG 12hr  tablet Commonly known as: MUCINEX DM Take 1 tablet by mouth 2 (two) times daily.   ipratropium 17 MCG/ACT inhaler Commonly known as: ATROVENT HFA Inhale 2 puffs into the lungs 2 (two) times daily.   iron polysaccharides 150 MG capsule Commonly known as: NIFEREX Take 1 capsule by mouth 2 (two) times daily.   metFORMIN 500 MG 24 hr tablet Commonly known as: GLUCOPHAGE-XR Take 4 tablets (2,000 mg total) by mouth daily.   metoprolol tartrate 100 MG tablet Commonly known as: LOPRESSOR Take 50-100 mg by mouth See admin instructions. 100 mg in the morning, 50 mg at bedtime   nortriptyline 10 MG capsule Commonly known as: PAMELOR Take 3 capsules by mouth at bedtime.   onetouch ultrasoft lancets Use 1-4 times daily as needed/directed  DX E11.9   OneTouch Verio test strip Generic drug: glucose blood Use 1-4 times daily as directed/needed   DX E11.9   OneTouch Verio w/Device Kit Use 1-4 times daily as needed/directed DX E11.9   oxybutynin 10 MG 24 hr tablet Commonly known as: DITROPAN-XL Take 10 mg by mouth at bedtime.   pantoprazole 40 MG tablet Commonly known as: PROTONIX Take 40 mg by mouth daily.   Potassium Chloride ER 20 MEQ Tbcr Take 1 tablet by mouth daily.   pregabalin 300 MG capsule Commonly known as: LYRICA Take 300 mg by mouth 2 (two) times daily.   rOPINIRole 1 MG tablet Commonly known as: REQUIP Take 1-2 mg by mouth at bedtime.   rosuvastatin 10 MG tablet Commonly known as: CRESTOR Take 10 mg by mouth 3 (three) times a week. M/ wed/ fri   traZODone 50 MG tablet Commonly known as: DESYREL Take 50 mg by mouth at bedtime as needed for sleep.   Trulicity 4.5 TF/5.7DU Sopn Generic drug: Dulaglutide INJECT 4.5 MG SUBCUTANEOUSLY EVERY WEEK   valsartan 160 MG tablet Commonly known as: Diovan Take 1 tablet (160 mg total) by mouth daily.   zinc sulfate 220 (50 Zn) MG capsule Take 1 capsule (220 mg total) by mouth daily.        Allergies:   Allergies  Allergen Reactions   Other     Steroids   Does not tolerate bad for stomach has to take med before and after if uses any steroid    Penicillins Other (  See Comments)    Unknown childhood allergic reaction - no other information available    Past Medical History:  Diagnosis Date   Anemia    iron infusions   Coronary artery disease    Cough syncope    Diabetes mellitus without complication (HCC)    Fibromyalgia    GERD (gastroesophageal reflux disease)    History of kidney stones    HTN (hypertension)    Hyperlipidemia    Hypertension    Morbid obesity (HCC)    Pneumonia    Sleep apnea    dont tolerate cpap   SOB (shortness of breath)    Uncontrolled type 2 diabetes mellitus with complication    Vitamin B51 deficiency     Past Surgical History:  Procedure Laterality Date   ELBOW SURGERY Right    HERNIA REPAIR     left and right inguinal , umbilical also   ROUX-EN-Y GASTRIC BYPASS     08-2016  something nicked and pt. had to have 3 units of blood   SHOULDER ARTHROSCOPY Right    TRANSURETHRAL RESECTION OF PROSTATE N/A 03/14/2019   Procedure: TRANSURETHRAL RESECTION OF THE PROSTATE (TURP);  Surgeon: Irine Seal, MD;  Location: [facility];  Service: Urology;  Laterality: N/A;    Family History  Problem Relation Age of Onset   Diabetes Father     Social History:  reports that he has quit smoking. He has quit using smokeless tobacco.  His smokeless tobacco use included chew. He reports that he does not drink alcohol and does not use drugs.  Review of Systems:  Last diabetic eye exam date  Last foot exam date:  Symptoms of neuropathy:  Hypertension:   Treatment includes  BP Readings from Last 3 Encounters:  07/16/21 (!) 144/90  06/24/21 131/63  03/06/21 140/80    Lipids:     Lab Results  Component Value Date   CHOL 79 01/07/2021   Lab Results  Component Value Date   HDL 25 (L) 01/07/2021   Lab Results  Component Value Date   LDLCALC 25  01/07/2021   Lab Results  Component Value Date   TRIG 143 01/07/2021   Lab Results  Component Value Date   CHOLHDL 3.2 01/07/2021   No results found for: "LDLDIRECT"   Examination:   There were no vitals taken for this visit.  There is no height or weight on file to calculate BMI.    ASSESSMENT/ PLAN:    Diabetes type 2:   Current regimen:  A1c is  Blood glucose control  There are no Patient Instructions on file for this visit.   Elayne Snare 12/02/2021, 8:59 PM

## 2021-12-03 ENCOUNTER — Ambulatory Visit (INDEPENDENT_AMBULATORY_CARE_PROVIDER_SITE_OTHER): Payer: Commercial Managed Care - PPO | Admitting: Endocrinology

## 2021-12-03 ENCOUNTER — Encounter: Payer: Self-pay | Admitting: Endocrinology

## 2021-12-03 VITALS — BP 122/80 | HR 70 | Ht 69.0 in | Wt 197.4 lb

## 2021-12-03 DIAGNOSIS — E1140 Type 2 diabetes mellitus with diabetic neuropathy, unspecified: Secondary | ICD-10-CM

## 2021-12-03 DIAGNOSIS — E1165 Type 2 diabetes mellitus with hyperglycemia: Secondary | ICD-10-CM | POA: Diagnosis not present

## 2021-12-03 LAB — POCT GLYCOSYLATED HEMOGLOBIN (HGB A1C): Hemoglobin A1C: 6.9 % — AB (ref 4.0–5.6)

## 2021-12-03 LAB — MICROALBUMIN / CREATININE URINE RATIO
Creatinine,U: 28.2 mg/dL
Microalb Creat Ratio: 2.5 mg/g (ref 0.0–30.0)
Microalb, Ur: <0.7 mg/dL (ref 0.0–1.9)

## 2021-12-03 LAB — POCT GLUCOSE (DEVICE FOR HOME USE): POC Glucose: 122 mg/dl — AB (ref 70–99)

## 2021-12-03 NOTE — Patient Instructions (Signed)
Check blood sugars on waking up 2-3 days a week  Also check blood sugars about 2 hours after meals and do this after different meals by rotation  Recommended blood sugar levels on waking up are 90-130 and about 2 hours after meal is 130-180  Please bring your blood sugar monitor to each visit, thank you   

## 2022-01-08 ENCOUNTER — Other Ambulatory Visit: Payer: Self-pay | Admitting: Internal Medicine

## 2022-01-12 ENCOUNTER — Other Ambulatory Visit: Payer: Self-pay | Admitting: Internal Medicine

## 2022-01-15 ENCOUNTER — Other Ambulatory Visit: Payer: Self-pay | Admitting: Internal Medicine

## 2022-01-16 ENCOUNTER — Telehealth: Payer: Self-pay

## 2022-01-16 DIAGNOSIS — E1140 Type 2 diabetes mellitus with diabetic neuropathy, unspecified: Secondary | ICD-10-CM

## 2022-01-16 NOTE — Telephone Encounter (Signed)
Wife called in states that patient needs refill on 2mg  ozempic. Rx has been denied. Took a look at your last office visit and states patient should be on 4.5mg  Trulicity. Wife states he has been on ozempic for over 6 months. Please clarify which medication you would like patient on.

## 2022-01-20 ENCOUNTER — Other Ambulatory Visit: Payer: Self-pay | Admitting: Internal Medicine

## 2022-01-21 MED ORDER — SEMAGLUTIDE (2 MG/DOSE) 8 MG/3ML ~~LOC~~ SOPN: 2.0000 mg | PEN_INJECTOR | SUBCUTANEOUS | 1 refills | Status: DC

## 2022-01-21 NOTE — Telephone Encounter (Signed)
Patient wife called back states that pharmacy doesn't have 2mg  in stock. They will stop by office and get sample.

## 2022-01-21 NOTE — Addendum Note (Signed)
Addended by: Eliseo Squires on: 01/21/2022 10:04 AM   Modules accepted: Orders

## 2022-01-22 NOTE — Telephone Encounter (Signed)
Patient came and picked up sample.

## 2022-03-14 ENCOUNTER — Other Ambulatory Visit: Payer: Self-pay | Admitting: Endocrinology

## 2022-03-14 DIAGNOSIS — E1140 Type 2 diabetes mellitus with diabetic neuropathy, unspecified: Secondary | ICD-10-CM

## 2022-04-05 ENCOUNTER — Other Ambulatory Visit: Payer: Self-pay | Admitting: Cardiovascular Disease

## 2022-04-06 ENCOUNTER — Other Ambulatory Visit: Payer: Self-pay | Admitting: Cardiovascular Disease

## 2022-04-21 ENCOUNTER — Other Ambulatory Visit (INDEPENDENT_AMBULATORY_CARE_PROVIDER_SITE_OTHER): Payer: Commercial Managed Care - PPO

## 2022-04-21 DIAGNOSIS — E1140 Type 2 diabetes mellitus with diabetic neuropathy, unspecified: Secondary | ICD-10-CM

## 2022-04-21 LAB — BASIC METABOLIC PANEL
BUN: 15 mg/dL (ref 6–23)
CO2: 27 mEq/L (ref 19–32)
Calcium: 9.2 mg/dL (ref 8.4–10.5)
Chloride: 101 mEq/L (ref 96–112)
Creatinine, Ser: 0.82 mg/dL (ref 0.40–1.50)
GFR: 90.57 mL/min (ref >60.00)
Glucose, Bld: 150 mg/dL — ABNORMAL HIGH (ref 70–99)
Potassium: 4.1 mEq/L (ref 3.5–5.1)
Sodium: 137 mEq/L (ref 135–145)

## 2022-04-21 LAB — HEMOGLOBIN A1C: Hgb A1c MFr Bld: 6.4 % (ref 4.6–6.5)

## 2022-04-23 NOTE — Progress Notes (Signed)
Patient ID: Grant Williams., male   DOB: [birth year], 66 y.o.   MRN: [id number]           Reason for Appointment: Type II Diabetes follow-up   History of Present Illness   Diagnosis date:  2008   Previous history: He was apparently taking metformin 2000 mg, Amaryl for a few years before being referred here He was started on insulin when his A1c was 11% compared to 9% prior to that His PCP also tried him on Farxiga but this caused very frequent urination and he could not tolerate the 5 mg dose  He took insulin 2015-2018; he had gastric bypass surgery in 2018.   Oral hypoglycemic drugs previously used are: Invokana, Farxiga, metformin, Amaryl, Levemir and Humalog insulin Insulin was used only in the hospital in 1/23  A1c range in the last few years is: 6.1-9.5  Recent history:     Non-insulin hypoglycemic drugs: Farxiga 10 mg, metformin ER 2000 mg and Ozempic 2 mg weekly    Side effects from medications: None  Current self management, blood sugar patterns and problems identified:  A1c is 6.4, was 6.9 He is still not checking his blood sugars much and likely only sporadically, readings are noted below by recall Glucose was 150 after breakfast in the lab He says he is trying to eat small frequent meals partly because of his history of gastric bypass and is generally watching his diet Also able to take his Ozempic consistently Usually trying to not have large amounts of carbohydrate His weight is about the same   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    Glucometer: One Touch.          Blood sugars by recall Fasting 114-130 After dinner about 130-150 pc Lowest about 95 before dinner   Hypoglycemia:  none  Weight control:  Wt Readings from Last 3 Encounters:  04/24/22 200 lb 6.4 oz (90.9 kg)  12/03/21 197 lb 6.4 oz (89.5 kg)  07/16/21 203 lb 6.4 oz (92.3 kg)             Diabetes labs:  Lab Results  Component Value Date   HGBA1C 6.4 04/21/2022   HGBA1C 6.9 (A) 12/03/2021   HGBA1C 9.4 (A) 07/16/2021   Lab Results  Component Value Date   MICROALBUR <0.7 12/03/2021   [place] 25 01/07/2021   CREATININE 0.82 04/21/2022     Allergies as of 04/24/2022       Reactions   Other    Steroids   Does not tolerate bad for stomach has to take med before and after if uses any steroid   Penicillins Other (See Comments)   Unknown childhood allergic reaction - no other information available        Medication List        Accurate as of April 24, 2022 11:50 AM. If you have any questions, ask your nurse or doctor.          acetaminophen 325 MG tablet Commonly known as: TYLENOL Take 2 tablets (650 mg total) by mouth every 6 (six) hours as needed for mild pain or headache (fever >/= 101).   albuterol 108 (90 Base) MCG/ACT inhaler Commonly known as: VENTOLIN HFA Inhale 2 puffs into the lungs every 6 (six) hours as needed for wheezing or shortness of breath.   amLODipine 10 MG tablet Commonly  known as: NORVASC Take 1 tablet (10 mg total) by mouth daily. SCHEDULE OFFICE VISIT FOR FUTURE REFILLS   ARIPiprazole 5 MG tablet Commonly known as: ABILIFY Take 5 mg by mouth daily.   ascorbic acid 500 MG tablet Commonly known as: VITAMIN C Take 1 tablet (500 mg total) by mouth daily.   aspirin EC 81 MG tablet Take 1 tablet (81 mg total) by mouth daily. Swallow whole.   CALCIUM CITRATE-VITAMIN D PO Take 1 tablet by mouth 2 (two) times daily. 600 + vit d3   cholecalciferol 25 MCG (1000 UNIT) tablet Commonly known as: VITAMIN D3 Take 1,000 Units by mouth 2 (two) times daily.   DULoxetine 30 MG capsule Commonly known as: CYMBALTA Take 60 mg by mouth daily.   ergocalciferol 1.25 MG (50000 UT) capsule Commonly known as: VITAMIN D2 Take 50,000 Units by mouth 3 (three) times a week. Mon / wed /and Fri   Farxiga 10 MG Tabs tablet Generic  drug: dapagliflozin propanediol TAKE 1 TABLET BY MOUTH EVERY DAY BEFORE BREAKFAST What changed: See the new instructions.   FreeStyle Libre 3 Sensor Misc 1 Device by Does not apply route every 14 (fourteen) days. Apply 1 sensor on upper arm every 14 days for continuous glucose monitoring Started by: Elayne Snare, MD   gabapentin 800 MG tablet Commonly known as: NEURONTIN Take 800 mg by mouth 4 (four) times daily.   guaiFENesin-dextromethorphan 100-10 MG/5ML syrup Commonly known as: ROBITUSSIN DM Take 10 mLs by mouth every 4 (four) hours as needed for cough.   dextromethorphan-guaiFENesin 30-600 MG 12hr tablet Commonly known as: MUCINEX DM Take 1 tablet by mouth 2 (two) times daily.   ipratropium 17 MCG/ACT inhaler Commonly known as: ATROVENT HFA Inhale 2 puffs into the lungs 2 (two) times daily.   iron polysaccharides 150 MG capsule Commonly known as: NIFEREX Take 1 capsule by mouth 2 (two) times daily.   metFORMIN 500 MG 24 hr tablet Commonly known as: GLUCOPHAGE-XR Take 4 tablets (2,000 mg total) by mouth daily.   metoprolol tartrate 100 MG tablet Commonly known as: LOPRESSOR Take 50-100 mg by mouth See admin instructions. 100 mg in the morning, 50 mg at bedtime   nortriptyline 10 MG capsule Commonly known as: PAMELOR Take 3 capsules by mouth at bedtime.   onetouch ultrasoft lancets Use 1-4 times daily as needed/directed  DX E11.9   OneTouch Verio test strip Generic drug: glucose blood Use 1-4 times daily as directed/needed   DX E11.9   OneTouch Verio w/Device Kit Use 1-4 times daily as needed/directed DX E11.9   oxybutynin 10 MG 24 hr tablet Commonly known as: DITROPAN-XL Take 10 mg by mouth at bedtime.   Ozempic (2 MG/DOSE) 8 MG/3ML Sopn Generic drug: Semaglutide (2 MG/DOSE) INJECT 2MG AS DIRECTED ONCE A WEEK   Potassium Chloride ER 20 MEQ Tbcr Take 1 tablet by mouth daily.   rOPINIRole 1 MG tablet Commonly known as: REQUIP Take 1-2 mg by mouth at  bedtime.   rosuvastatin 10 MG tablet Commonly known as: CRESTOR Take 10 mg by mouth 3 (three) times a week. M/ wed/ fri   traZODone 50 MG tablet Commonly known as: DESYREL Take 50 mg by mouth at bedtime as needed for sleep.   valsartan 160 MG tablet Commonly known as: Diovan Take 1 tablet (160 mg total) by mouth daily.   zinc sulfate 220 (50 Zn) MG capsule Take 1 capsule (220 mg total) by mouth daily.        Allergies:  Allergies  Allergen Reactions   Other     Steroids   Does not tolerate bad for stomach has to take med before and after if uses any steroid    Penicillins Other (See Comments)    Unknown childhood allergic reaction - no other information available    Past Medical History:  Diagnosis Date   Anemia    iron infusions   Coronary artery disease    Cough syncope    Diabetes mellitus without complication (HCC)    Fibromyalgia    GERD (gastroesophageal reflux disease)    History of kidney stones    HTN (hypertension)    Hyperlipidemia    Hypertension    Morbid obesity ([place])    Pneumonia    Sleep apnea    dont tolerate cpap   SOB (shortness of breath)    Uncontrolled type 2 diabetes mellitus with complication    Vitamin Z61 deficiency     Past Surgical History:  Procedure Laterality Date   ELBOW SURGERY Right    HERNIA REPAIR     left and right inguinal , umbilical also   ROUX-EN-Y GASTRIC BYPASS     08-2016  something nicked and pt. had to have 3 units of blood   SHOULDER ARTHROSCOPY Right    TRANSURETHRAL RESECTION OF PROSTATE N/A 03/14/2019   Procedure: TRANSURETHRAL RESECTION OF THE PROSTATE (TURP);  Surgeon: Irine Seal, MD;  Location: [facility];  Service: Urology;  Laterality: N/A;    Family History  Problem Relation Age of Onset   Diabetes Father     Social History:  reports that he has quit smoking. He has quit using smokeless tobacco.  His smokeless tobacco use included chew. He reports that he does not drink alcohol and does not use  drugs.  Review of Systems:  Last diabetic eye exam date 5/22  Last foot exam date: 6/23  Symptoms of neuropathy: Tingling and numbness Has only some relief of pain with gabapentin but also taking Cymbalta Followed by neurologist  Hypertension:   Treatment includes valsartan 160 mg daily   BP Readings from Last 3 Encounters:  04/24/22 126/80  12/03/21 122/80  07/16/21 (!) 144/90    Lipids: Followed by PCP, recent LDL 64 with total cholesterol 136   Lab Results  Component Value Date   CHOL 79 01/07/2021   Lab Results  Component Value Date   HDL 25 (L) 01/07/2021   Lab Results  Component Value Date   LDLCALC 25 01/07/2021   Lab Results  Component Value Date   TRIG 143 01/07/2021   Lab Results  Component Value Date   CHOLHDL 3.2 01/07/2021   No results found for: "LDLDIRECT"   Examination:   BP 126/80   Pulse 64   Ht 5' 9" (1.753 m)   Wt 200 lb 6.4 oz (90.9 kg)   SpO2 99%   BMI 29.59 kg/m   Body mass index is 29.59 kg/m.    ASSESSMENT/ PLAN:    Diabetes type 2:   Current regimen: Farxiga, Ozempic and metformin  A1c is 6.4 and improved  Blood glucose control is appearing excellent with better A1c but not clear what his blood sugars are at home and how often he is monitoring  His level of control may be better with Ozempic compared to Trulicity However glucose monitoring at home is quite inadequate Although he has not lost any further weight his BMI is just under 30 now  He wants to try a continuous glucose monitor  as he is not very regular with fingersticks Prescription will be sent for [place] 3 He will bring his monitor for download on the next visit No change in current medication regimen for diabetes   Patient Instructions  Check blood sugars on waking up 2-3 days a week  Also check blood sugars about 2 hours after meals and do this after different meals by rotation  Recommended blood sugar levels on waking up are 90-130 and about 2  hours after meal is 130-160  Please bring your blood sugar monitor to each visit, thank you    Elayne Snare 04/24/2022, 11:50 AM

## 2022-04-24 ENCOUNTER — Encounter: Payer: Self-pay | Admitting: Endocrinology

## 2022-04-24 ENCOUNTER — Ambulatory Visit (INDEPENDENT_AMBULATORY_CARE_PROVIDER_SITE_OTHER): Payer: Commercial Managed Care - PPO | Admitting: Endocrinology

## 2022-04-24 VITALS — BP 126/80 | HR 64 | Ht 69.0 in | Wt 200.4 lb

## 2022-04-24 DIAGNOSIS — E1140 Type 2 diabetes mellitus with diabetic neuropathy, unspecified: Secondary | ICD-10-CM | POA: Diagnosis not present

## 2022-04-24 MED ORDER — FREESTYLE LIBRE 3 SENSOR MISC: 1.0000 | 2 refills | Status: DC

## 2022-04-24 NOTE — Patient Instructions (Signed)
Check blood sugars on waking up 2-3 days a week  Also check blood sugars about 2 hours after meals and do this after different meals by rotation  Recommended blood sugar levels on waking up are 90-130 and about 2 hours after meal is 130-160  Please bring your blood sugar monitor to each visit, thank you   

## 2022-05-12 ENCOUNTER — Other Ambulatory Visit: Payer: Self-pay | Admitting: Cardiovascular Disease

## 2022-06-04 ENCOUNTER — Other Ambulatory Visit: Payer: Self-pay

## 2022-06-04 DIAGNOSIS — E1140 Type 2 diabetes mellitus with diabetic neuropathy, unspecified: Secondary | ICD-10-CM

## 2022-06-04 MED ORDER — DAPAGLIFLOZIN PROPANEDIOL 10 MG PO TABS: 10.0000 mg | ORAL_TABLET | Freq: Every day | ORAL | 3 refills | Status: DC

## 2022-06-10 ENCOUNTER — Other Ambulatory Visit: Payer: Self-pay | Admitting: Endocrinology

## 2022-06-10 DIAGNOSIS — E1140 Type 2 diabetes mellitus with diabetic neuropathy, unspecified: Secondary | ICD-10-CM

## 2022-07-15 ENCOUNTER — Other Ambulatory Visit: Payer: Self-pay | Admitting: Endocrinology

## 2022-08-03 ENCOUNTER — Other Ambulatory Visit: Payer: Self-pay | Admitting: Endocrinology

## 2022-08-03 DIAGNOSIS — E1140 Type 2 diabetes mellitus with diabetic neuropathy, unspecified: Secondary | ICD-10-CM

## 2022-08-12 ENCOUNTER — Encounter: Payer: Self-pay | Admitting: Endocrinology

## 2022-08-24 ENCOUNTER — Other Ambulatory Visit: Payer: Commercial Managed Care - PPO

## 2022-08-26 ENCOUNTER — Other Ambulatory Visit (HOSPITAL_COMMUNITY): Payer: Self-pay

## 2022-08-26 ENCOUNTER — Other Ambulatory Visit: Payer: Self-pay | Admitting: Endocrinology

## 2022-08-26 NOTE — Telephone Encounter (Signed)
Cannot do benefits investigation due to recent fill of medication. It is very common for the insurance to not pay for the generic of a Brand name medication that has recently been released, it takes a while for the generic to get added into their systems and be covered. There also may be contracts with the manufacture that requires them to cover the Brand.

## 2022-08-27 ENCOUNTER — Other Ambulatory Visit: Payer: Self-pay

## 2022-08-27 ENCOUNTER — Ambulatory Visit: Payer: Commercial Managed Care - PPO | Admitting: Endocrinology

## 2022-08-27 MED ORDER — METFORMIN HCL ER 500 MG PO TB24: ORAL_TABLET | ORAL | 1 refills | Status: DC

## 2022-08-30 ENCOUNTER — Other Ambulatory Visit: Payer: Self-pay | Admitting: Endocrinology

## 2022-08-30 DIAGNOSIS — E1140 Type 2 diabetes mellitus with diabetic neuropathy, unspecified: Secondary | ICD-10-CM

## 2022-09-01 ENCOUNTER — Other Ambulatory Visit (INDEPENDENT_AMBULATORY_CARE_PROVIDER_SITE_OTHER): Payer: Medicare HMO

## 2022-09-01 DIAGNOSIS — E1140 Type 2 diabetes mellitus with diabetic neuropathy, unspecified: Secondary | ICD-10-CM | POA: Diagnosis not present

## 2022-09-01 LAB — BASIC METABOLIC PANEL
BUN: 12 mg/dL (ref 6–23)
CO2: 25 mEq/L (ref 19–32)
Calcium: 9 mg/dL (ref 8.4–10.5)
Chloride: 104 mEq/L (ref 96–112)
Creatinine, Ser: 0.77 mg/dL (ref 0.40–1.50)
GFR: 92.07 mL/min (ref >60.00)
Glucose, Bld: 190 mg/dL — ABNORMAL HIGH (ref 70–99)
Potassium: 3.5 mEq/L (ref 3.5–5.1)
Sodium: 139 mEq/L (ref 135–145)

## 2022-09-01 LAB — HEMOGLOBIN A1C: Hgb A1c MFr Bld: 6.5 % (ref 4.6–6.5)

## 2022-09-03 ENCOUNTER — Ambulatory Visit: Payer: Medicare HMO | Admitting: Endocrinology

## 2022-09-03 ENCOUNTER — Encounter: Payer: Self-pay | Admitting: Endocrinology

## 2022-09-03 VITALS — BP 146/88 | HR 63 | Ht 69.0 in | Wt 198.2 lb

## 2022-09-03 DIAGNOSIS — E1140 Type 2 diabetes mellitus with diabetic neuropathy, unspecified: Secondary | ICD-10-CM

## 2022-09-03 NOTE — Progress Notes (Signed)
Patient ID: Grant Cogliano., male   DOB: [birth year], 67 y.o.   MRN: [id number]           Reason for Appointment: Type II Diabetes follow-up   History of Present Illness   Diagnosis date:  2008   Previous history: He was apparently taking metformin 2000 mg, Amaryl for a few years before being referred here He was started on insulin when his A1c was 11% compared to 9% prior to that His PCP also tried him on Farxiga but this caused very frequent urination and he could not tolerate the 5 mg dose  He took insulin 2015-2018; he had gastric bypass surgery in 2018.   Oral hypoglycemic drugs previously used are: Invokana, Farxiga, metformin, Amaryl, Levemir and Humalog insulin Insulin was used only in the hospital in 1/23  A1c range in the last few years is: 6.1-9.5  Recent history:     Non-insulin hypoglycemic drugs: Farxiga 10 mg, metformin ER 2000 mg and Ozempic 2 mg weekly    Side effects from medications: None  Current self management, blood sugar patterns and problems identified:  A1c is 6.5 and stable  He is still not checking his blood sugars much and likely only sporadically, readings are noted below by recall Blood sugar levels were analyzed from his freestyle libre 3 sensor which she has started using since his last visit and was able to get this covered Although his blood sugars overall are well-controlled not clear if his sensors are consistently accurate with a couple of days of likely falsely high readings about a week ago  Also his blood sugar tracings show unusual spikes at mealtimes especially in the afternoon and sometimes overnight also  However his blood sugars overall are about 80% within target range Fasting blood sugars are fairly good also Again is trying to eat small frequent meals partly because of his history of gastric bypass  Likely getting some extra carbohydrates at times when his blood sugars are higher as he does not usually Have regular soft  drinks Also able to refill his Ozempic consistently and is taking this consistently every week Has not lost any weight recently   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 Diet management: Tries to reduce carbohydrates Breakfast: eggs and toast      Interpretation of the [facility] 3 download for the last 2 weeks as follows  He does appear to have some variability with significant oscillation of his blood sugar data throughout the day and night and not clear how accurate this is On 3/8 and 3/9 he was having unusually low blood sugars at all times and likely is not accurate Hyperglycemic episodes appear to be occurring periodically overnight, mid afternoon or evening and appear to be rather abrupt episodes of high sugars and decreasing within 2 hours, frequently blood sugars are as high as 280 This is not consistent and AVERAGE postprandial reading is not over 140 except after dinner when they are averaging 148, only 136 after lunch Fasting blood sugars are excellent averaging about 115 Hypoglycemia is likely not present as some of the low sugars were artifactual  Time in range 79% with the 7% below 70 likely falsely low and overall average 127   Weight control:  Wt Readings from Last 3 Encounters:  09/03/22 198 lb 3.2 oz (89.9 kg)  04/24/22 200 lb 6.4 oz (90.9 kg)  12/03/21 197 lb 6.4 oz (89.5 kg)  Diabetes labs:  Lab Results  Component Value Date   HGBA1C 6.5 09/01/2022   HGBA1C 6.4 04/21/2022   HGBA1C 6.9 (A) 12/03/2021   Lab Results  Component Value Date   MICROALBUR <0.7 12/03/2021   [place] 25 01/07/2021   CREATININE 0.77 09/01/2022     Allergies as of 09/03/2022       Reactions   Other    Steroids   Does not tolerate bad for stomach has to take med before and after if uses any steroid   Penicillins Other (See Comments)   Unknown childhood allergic reaction - no other information available        Medication List        Accurate as of September 03, 2022 11:59 PM. If you have any questions, ask your nurse or doctor.          acetaminophen 325 MG tablet Commonly known as: TYLENOL Take 2 tablets (650 mg total) by mouth every 6 (six) hours as needed for mild pain or headache (fever >/= 101).   albuterol 108 (90 Base) MCG/ACT inhaler Commonly known as: VENTOLIN HFA Inhale 2 puffs into the lungs every 6 (six) hours as needed for wheezing or shortness of breath.   amLODipine 10 MG tablet Commonly known as: NORVASC Take 1 tablet (10 mg total) by mouth daily. Please contact the office to schedule appointment. 2ed attempt.   ARIPiprazole 5 MG tablet Commonly known as: ABILIFY Take 5 mg by mouth daily.   ascorbic acid 500 MG tablet Commonly known as: VITAMIN C Take 1 tablet (500 mg total) by mouth daily.   aspirin EC 81 MG tablet Take 1 tablet (81 mg total) by mouth daily. Swallow whole.   CALCIUM CITRATE-VITAMIN D PO Take 1 tablet by mouth 2 (two) times daily. 600 + vit d3   cholecalciferol 25 MCG (1000 UNIT) tablet Commonly known as: VITAMIN D3 Take 1,000 Units by mouth 2 (two) times daily.   dapagliflozin propanediol 10 MG Tabs tablet Commonly known as: Farxiga Take 1 tablet (10 mg total) by mouth daily.   DULoxetine 30 MG capsule Commonly known as: CYMBALTA Take 60 mg by mouth daily.   ergocalciferol 1.25 MG (50000 UT) capsule Commonly known as: VITAMIN D2 Take 50,000 Units by mouth 3 (three) times a week. Mon / wed /and Fri   FreeStyle Libre 3 Sensor Misc APPLY 1 SENSOR ON UPPER ARM EVERY 14 DAYS FOR CONTINUOUS GLUCOSE MONITORING   gabapentin 800 MG tablet Commonly known as: NEURONTIN Take 800 mg by mouth 4 (four) times daily.   guaiFENesin-dextromethorphan 100-10 MG/5ML syrup Commonly known as: ROBITUSSIN DM Take 10 mLs by mouth every 4 (four) hours as needed for cough.   dextromethorphan-guaiFENesin 30-600 MG 12hr tablet Commonly known as: MUCINEX DM Take 1 tablet by mouth 2 (two) times daily.    ipratropium 17 MCG/ACT inhaler Commonly known as: ATROVENT HFA Inhale 2 puffs into the lungs 2 (two) times daily.   iron polysaccharides 150 MG capsule Commonly known as: NIFEREX Take 1 capsule by mouth 2 (two) times daily.   metFORMIN 500 MG 24 hr tablet Commonly known as: GLUCOPHAGE-XR TAKE 4 TABLETS EVERY DAY   metoprolol tartrate 100 MG tablet Commonly known as: LOPRESSOR Take 50-100 mg by mouth See admin instructions. 100 mg in the morning, 50 mg at bedtime   nortriptyline 10 MG capsule Commonly known as: PAMELOR Take 3 capsules by mouth at bedtime.   onetouch ultrasoft lancets Use 1-4 times daily as needed/directed  DX E11.9   OneTouch Verio test strip Generic drug: glucose blood Use 1-4 times daily as directed/needed   DX E11.9   OneTouch Verio w/Device Kit Use 1-4 times daily as needed/directed DX E11.9   oxybutynin 10 MG 24 hr tablet Commonly known as: DITROPAN-XL Take 10 mg by mouth at bedtime.   Ozempic (2 MG/DOSE) 8 MG/3ML Sopn Generic drug: Semaglutide (2 MG/DOSE) INJECT 2MG  EVERY WEEK   Potassium Chloride ER 20 MEQ Tbcr Take 1 tablet by mouth daily.   rOPINIRole 1 MG tablet Commonly known as: REQUIP Take 1-2 mg by mouth at bedtime.   rosuvastatin 10 MG tablet Commonly known as: CRESTOR Take 10 mg by mouth 3 (three) times a week. M/ wed/ fri   traZODone 50 MG tablet Commonly known as: DESYREL Take 50 mg by mouth at bedtime as needed for sleep.   valsartan 160 MG tablet Commonly known as: Diovan Take 1 tablet (160 mg total) by mouth daily.   zinc sulfate 220 (50 Zn) MG capsule Take 1 capsule (220 mg total) by mouth daily.        Allergies:  Allergies  Allergen Reactions   Other     Steroids   Does not tolerate bad for stomach has to take med before and after if uses any steroid    Penicillins Other (See Comments)    Unknown childhood allergic reaction - no other information available    Past Medical History:  Diagnosis Date    Anemia    iron infusions   Coronary artery disease    Cough syncope    Diabetes mellitus without complication (HCC)    Fibromyalgia    GERD (gastroesophageal reflux disease)    History of kidney stones    HTN (hypertension)    Hyperlipidemia    Hypertension    Morbid obesity ([place])    Pneumonia    Sleep apnea    dont tolerate cpap   SOB (shortness of breath)    Uncontrolled type 2 diabetes mellitus with complication    Vitamin [id number] deficiency     Past Surgical History:  Procedure Laterality Date   ELBOW SURGERY Right    HERNIA REPAIR     left and right inguinal , umbilical also   ROUX-EN-Y GASTRIC BYPASS     08-2016  something nicked and pt. had to have 3 units of blood   SHOULDER ARTHROSCOPY Right    TRANSURETHRAL RESECTION OF PROSTATE N/A 03/14/2019   Procedure: TRANSURETHRAL RESECTION OF THE PROSTATE (TURP);  Surgeon: Irine Seal, MD;  Location: [facility];  Service: Urology;  Laterality: N/A;    Family History  Problem Relation Age of Onset   Diabetes Father     Social History:  reports that he has quit smoking. He has quit using smokeless tobacco.  His smokeless tobacco use included chew. He reports that he does not drink alcohol and does not use drugs.  Review of Systems:  Last diabetic eye exam date 5/22  Last foot exam date: 6/23  Symptoms of neuropathy: Tingling and numbness Has only some relief of pain with gabapentin but also taking Cymbalta Followed by neurologist  Hypertension:   Treatment includes valsartan 160 mg daily   BP Readings from Last 3 Encounters:  09/03/22 (!) 146/88  04/24/22 126/80  12/03/21 122/80    Lipids: Followed by PCP, recent LDL 64 with total cholesterol 136   Lab Results  Component Value Date   CHOL 79 01/07/2021   Lab Results  Component Value  Date   HDL 25 (L) 01/07/2021   Lab Results  Component Value Date   LDLCALC 25 01/07/2021   Lab Results  Component Value Date   TRIG 143 01/07/2021   Lab Results  Component  Value Date   CHOLHDL 3.2 01/07/2021   No results found for: "LDLDIRECT"   Examination:   BP (!) 146/88 (BP Location: Left Arm, Patient Position: Sitting, Cuff Size: Normal)   Pulse 63   Ht 5\' 9"  (1.753 m)   Wt 198 lb 3.2 oz (89.9 kg)   SpO2 95%   BMI 29.27 kg/m   Body mass index is 29.27 kg/m.    ASSESSMENT/ PLAN:    Diabetes type 2:   Current regimen: Farxiga, Ozempic and metformin  A1c is 6.5 and stable  Blood glucose control is again excellent with at target A1c   His sensor shows some readings over 200 but usually not consistent and overall average blood sugar is still not relatively high on the sensor He is doing generally very well on Ozempic which he is tolerating He will continue the same regimen Encouraged him to exercise regularly also   Patient Instructions  Use sensor 1x per month   Grant Williams 09/05/2022, 10:57 AM

## 2022-09-03 NOTE — Patient Instructions (Signed)
Use sensor 1x per month

## 2022-09-09 ENCOUNTER — Encounter: Payer: Self-pay | Admitting: Endocrinology

## 2022-10-12 ENCOUNTER — Other Ambulatory Visit: Payer: Self-pay | Admitting: Endocrinology

## 2022-10-13 ENCOUNTER — Other Ambulatory Visit: Payer: Self-pay

## 2022-11-08 ENCOUNTER — Other Ambulatory Visit: Payer: Self-pay | Admitting: Endocrinology

## 2022-11-08 DIAGNOSIS — E1140 Type 2 diabetes mellitus with diabetic neuropathy, unspecified: Secondary | ICD-10-CM

## 2023-01-04 ENCOUNTER — Encounter: Payer: Self-pay | Admitting: "Endocrinology

## 2023-01-04 ENCOUNTER — Ambulatory Visit: Payer: Medicare HMO | Admitting: "Endocrinology

## 2023-01-04 VITALS — BP 130/60 | HR 79 | Ht 69.0 in | Wt 197.0 lb

## 2023-01-04 DIAGNOSIS — Z7985 Long-term (current) use of injectable non-insulin antidiabetic drugs: Secondary | ICD-10-CM | POA: Diagnosis not present

## 2023-01-04 DIAGNOSIS — Z9884 Bariatric surgery status: Secondary | ICD-10-CM

## 2023-01-04 DIAGNOSIS — Z7984 Long term (current) use of oral hypoglycemic drugs: Secondary | ICD-10-CM | POA: Diagnosis not present

## 2023-01-04 DIAGNOSIS — E7800 Pure hypercholesterolemia, unspecified: Secondary | ICD-10-CM

## 2023-01-04 DIAGNOSIS — E1140 Type 2 diabetes mellitus with diabetic neuropathy, unspecified: Secondary | ICD-10-CM

## 2023-01-04 LAB — BASIC METABOLIC PANEL
BUN: 15 mg/dL (ref 6–23)
CO2: 30 mEq/L (ref 19–32)
Calcium: 9.3 mg/dL (ref 8.4–10.5)
Chloride: 104 mEq/L (ref 96–112)
Creatinine, Ser: 0.83 mg/dL (ref 0.40–1.50)
GFR: 89.79 mL/min (ref >60.00)
Glucose, Bld: 98 mg/dL (ref 70–99)
Potassium: 4.3 mEq/L (ref 3.5–5.1)
Sodium: 140 mEq/L (ref 135–145)

## 2023-01-04 LAB — MICROALBUMIN / CREATININE URINE RATIO
Creatinine,U: 47 mg/dL
Microalb Creat Ratio: 1.9 mg/g (ref 0.0–30.0)
Microalb, Ur: 0.9 mg/dL (ref 0.0–1.9)

## 2023-01-04 LAB — POCT GLYCOSYLATED HEMOGLOBIN (HGB A1C): Hemoglobin A1C: 5.9 % — AB (ref 4.0–5.6)

## 2023-01-04 LAB — HEMOGLOBIN A1C: Hgb A1c MFr Bld: 6.2 % (ref 4.6–6.5)

## 2023-01-04 NOTE — Patient Instructions (Signed)
10-Point Nutrition Plan for Preventing Hypoglycemia in Post-Bariatric Hypoglycemia. Control portions of carbohydrate - 30 grams/meal, 15 grams/snack. Choose low-glycemic carbohydrates. Avoid high-glycemic carbohydrates. Include (heart-healthy) fats in each meal or snack - 15 grams/meal, 5 grams/snack. Emphasize optimal protein intake. Space meals/snacks 3-4 hours apart. Avoid consuming liquids with meals. Avoid alcohol. Avoid caffeine. Maintain post-bariatric vitamin and mineral intake.    Low Glycemic Index Carbohydrates (CHOOSE) Steel-cut oats (regular, not quick-cook or instant) Oat bran cereal Beans/legumes (e.g., garbanzo, navy, kidney, lima, pinto, black-eyed and pea beans, edamame (soybeans), lentils Bean products (e.g., hummus, tofu) Pearled barley, cooked al dente Yams Some fruits (e.g., grapefruit, apples, pears, berries, apricots, peaches) Some pasta (e.g., Barilla Plus pasta), cooked al dente Some whole grain breads (e.g., Ezekiel bread, Joseph's Flax, Oat Bran & Whole Wheat Pita/Lavash/Tortillas) Some whole grain crackers (e.g., RyKrisp, RyVita, [place]) Brown rice, wild rice Quinoa  Buckwheat (a grass)   High Glycemic Index Carbohydrates (AVOID) Refined breakfast cereals (e.g., Corn Flakes, Rice Krispies, Cream of Rice, instant oatmeal) Regular pasta Most starchy vegetables (e.g., white potatoes, corn, winter (orange) squash) White rice, rice cakes Popcorn, pretzels, chips Some fruits (e.g., ripe bananas, pineapple, mango, watermelon, grapes) All fruit juices and sweetened drinks (e.g., sodas, sweetened iced tea) Bread, rolls, bagels, English muffins, and crackers made with refined flour Sweets (e.g., candy, cake, cookies, ice cream, syrup)   Heart-Healthy Fats (Adapt) Nuts, nut butters Avocado, guacamole Olives Most plant oils (e.g., olive, canola, peanut, soy, sunflower, sesame) Most seeds (e.g., sunflower, flax, sesame/sesame tahini) Oily fish (e.g., salmon,  bluefish, mackerel, tuna, sardines)    Recommended vitamin supplementation after bariatric surgery  Supplement Recommendations for the Post- Bariatric Surgery Patient  Multivitamin-multi-mineral: Begin with chewable or liquid, progress to whole tablet/capsule 200% of daily value (2 per day)  Vitamin B12: Sublingual tablets, liquid drops or mouth spray Note >1000 mg of supplemental folic acid combined with multivitamin supplements may mask B12 deficiency 1000 g/month intramuscularly Sublingual/oral tablet dose: 350-500 g/day  Calcium Citrate and Vitamin D3 Begin with chewable or liquid, progress to whole tablet/capsule Split into 500-600 mg dose Do not combine with iron containing supplements. Wait > 2 hours after taking multivitamin or iron supplement 1500-2000 mg/day Calcium Citrate 1500-2000 IU/day Vit D3 (with dose adjustments guided by laboratory assessment of vitamin D levels)  Iron Recommended for menstruating women and those at risk for anemia. Begin with chewable or liquid, progress to tablet. 18-27 mg/day elemental iron  B complex vitamins Liquid form, avoid time-released tablets Vitamin B-50 complex 1 per day    

## 2023-01-04 NOTE — Progress Notes (Signed)
Outpatient Endocrinology Note Grant Cumberland, MD  01/04/23   Grant Williams 1955-11-21 [id number]  Referring Provider: Jettie Pagan, NP Primary Care Provider: Jettie Pagan, NP Reason for consultation: Subjective   Assessment & Plan  Roston was seen today for diabetes.  Diagnoses and all orders for this visit:  Type 2 diabetes mellitus with diabetic neuropathy, without long-term current use of insulin (HCC) -     Microalbumin / creatinine urine ratio -     Basic metabolic panel -     Hemoglobin A1c -     POCT glycosylated hemoglobin (Hb A1C) -     Lipid panel  Long term (current) use of oral hypoglycemic drugs  Long-term (current) use of injectable non-insulin antidiabetic drugs  Pure hypercholesterolemia  History of Roux-en-Y gastric bypass    Diabetes Type II complicated by neuropathy  Lab Results  Component Value Date   GFR 92.07 09/01/2022   Hba1c goal less than 7, current Hba1c is  Lab Results  Component Value Date   HGBA1C 5.9 (A) 01/04/2023   Will recommend the following: Farxiga 10 mg every day Ozempic  2 mg/wk Metformin XR 500 mg 4 times a day  Hypoglycemia likely due to gastric bypass Instructed small, frequent, complex carb meals -given handout   No known contraindications to any of above medications  -Last LD and Tg are as follows: Lab Results  Component Value Date   LDLCALC 25 01/07/2021    Lab Results  Component Value Date   TRIG 143 01/07/2021   -Recommend rosuvastatin 10 mg QD -Follow low fat diet and exercise   -Blood pressure goal <140/90 - Microalbumin/creatinine goal < 30 -Last MA/Cr is as follows: Lab Results  Component Value Date   MICROALBUR <0.7 12/03/2021   -on ACE/ARB Valsartan 160 mg every day  -diet changes including salt restriction -limit eating outside -counseled BP targets per standards of diabetes care -uncontrolled blood pressure can lead to retinopathy, nephropathy and cardiovascular  and atherosclerotic heart disease  Reviewed and counseled on: -A1C target -Blood sugar targets -Complications of uncontrolled diabetes  -Checking blood sugar before meals and bedtime and bring log next visit -All medications with mechanism of action and side effects -Hypoglycemia management: rule of 15's, Glucagon Emergency Kit and medical alert ID -low-carb low-fat plate-method diet -At least 20 minutes of physical activity per day -Annual dilated retinal eye exam and foot exam -compliance and follow up needs -follow up as scheduled or earlier if problem gets worse  Call if blood sugar is less than 70 or consistently above 250    Take a 15 gm snack of carbohydrate at bedtime before you go to sleep if your blood sugar is less than 100.    If you are going to fast after midnight for a test or procedure, ask your physician for instructions on how to reduce/decrease your insulin dose.    Call if blood sugar is less than 70 or consistently above 250  -Treating a low sugar by rule of 15  (15 gms of sugar every 15 min until sugar is more than 70) If you feel your sugar is low, test your sugar to be sure If your sugar is low (less than 70), then take 15 grams of a fast acting Carbohydrate (3-4 glucose tablets or glucose gel or 4 ounces of juice or regular soda) Recheck your sugar 15 min after treating low to make sure it is more than 70 If sugar is still less than  70, treat again with 15 grams of carbohydrate          Don't drive the hour of hypoglycemia  If unconscious/unable to eat or drink by mouth, use glucagon injection or nasal spray baqsimi and call 911. Can repeat again in 15 min if still unconscious.  No follow-ups on file.   I have reviewed current medications, nurse's notes, allergies, vital signs, past medical and surgical history, family medical history, and social history for this encounter. Counseled patient on symptoms, examination findings, lab findings, imaging results,  treatment decisions and monitoring and prognosis. The patient understood the recommendations and agrees with the treatment plan. All questions regarding treatment plan were fully answered.  Grant Creekside, MD  01/04/23    History of Present Illness Grant Williams. is a 67 y.o. year old male who presents for evaluation of Type II diabetes mellitus.  Grant Williams. was first diagnosed in 2008.   Diabetes education +  Home diabetes regimen: Farxiga 10 mg every day Ozempic  2 mg/wk Metformin XR 500 mg 4 times a day   Prior history: He took insulin 2015-2018; he had gastric bypass surgery in 2018.   Oral hypoglycemic drugs previously used are: Invokana, Farxiga, metformin, Amaryl, Levemir and Humalog insulin Insulin was used only in the hospital in 1/23  COMPLICATIONS -  MI/Stroke -  retinopathy +  neuropathy -  nephropathy  BLOOD SUGAR DATA  CGM interpretation: At today's visit, we reviewed her CGM downloads. The full report is scanned in the media. Reviewing the CGM trends, BG are well controlled except rare lows overnight and evening.   Physical Exam  BP 130/60   Pulse 79   Ht 5\' 9"  (1.753 m)   Wt 197 lb (89.4 kg)   SpO2 96%   BMI 29.09 kg/m    Constitutional: well developed, well nourished Head: normocephalic, atraumatic Eyes: sclera anicteric, no redness Neck: supple Lungs: normal respiratory effort Neurology: alert and oriented Skin: dry, no appreciable rashes Musculoskeletal: no appreciable defects Psychiatric: normal mood and affect Diabetic Foot Exam - Simple   Simple Foot Form Diabetic Foot exam was performed with the following findings: Yes 01/04/2023  8:30 AM  Visual Inspection No deformities, no ulcerations, no other skin breakdown bilaterally: Yes Sensation Testing Intact to touch and monofilament testing bilaterally: Yes Pulse Check Posterior Tibialis and Dorsalis pulse intact bilaterally: Yes Comments      Current Medications Patient's  Medications  New Prescriptions   No medications on file  Previous Medications   ACETAMINOPHEN (TYLENOL) 325 MG TABLET    Take 2 tablets (650 mg total) by mouth every 6 (six) hours as needed for mild pain or headache (fever >/= 101).   ALBUTEROL (VENTOLIN HFA) 108 (90 BASE) MCG/ACT INHALER    Inhale 2 puffs into the lungs every 6 (six) hours as needed for wheezing or shortness of breath.   AMLODIPINE (NORVASC) 10 MG TABLET    Take 1 tablet (10 mg total) by mouth daily. Please contact the office to schedule appointment. 2ed attempt.   ARIPIPRAZOLE (ABILIFY) 5 MG TABLET    Take 5 mg by mouth daily.    ASCORBIC ACID (VITAMIN C) 500 MG TABLET    Take 1 tablet (500 mg total) by mouth daily.   ASPIRIN EC 81 MG EC TABLET    Take 1 tablet (81 mg total) by mouth daily. Swallow whole.   BLOOD GLUCOSE MONITORING SUPPL (ONETOUCH VERIO) W/DEVICE KIT    Use 1-4 times daily  as needed/directed DX E11.9   CALCIUM CITRATE-VITAMIN D PO    Take 1 tablet by mouth 2 (two) times daily. 600 + vit d3   CHOLECALCIFEROL (VITAMIN D3) 25 MCG (1000 UT) TABLET    Take 1,000 Units by mouth 2 (two) times daily.   CONTINUOUS GLUCOSE SENSOR (FREESTYLE LIBRE 3 SENSOR) MISC    APPLY 1 SENSOR ON UPPER ARM EVERY 14 DAYS FOR CONTINOUS GLUCOSE MONITORING   DAPAGLIFLOZIN PROPANEDIOL (FARXIGA) 10 MG TABS TABLET    Take 1 tablet (10 mg total) by mouth daily.   DEXTROMETHORPHAN-GUAIFENESIN (MUCINEX DM) 30-600 MG 12HR TABLET    Take 1 tablet by mouth 2 (two) times daily.   DULOXETINE (CYMBALTA) 30 MG CAPSULE    Take 60 mg by mouth daily.   ERGOCALCIFEROL (VITAMIN D2) 50000 UNITS CAPSULE    Take 50,000 Units by mouth 3 (three) times a week. Mon / wed /and Fri   GABAPENTIN (NEURONTIN) 800 MG TABLET    Take 800 mg by mouth 4 (four) times daily.    GUAIFENESIN-DEXTROMETHORPHAN (ROBITUSSIN DM) 100-10 MG/5ML SYRUP    Take 10 mLs by mouth every 4 (four) hours as needed for cough.   IPRATROPIUM (ATROVENT HFA) 17 MCG/ACT INHALER    Inhale 2 puffs into  the lungs 2 (two) times daily.   IRON POLYSACCHARIDES (NIFEREX) 150 MG CAPSULE    Take 1 capsule by mouth 2 (two) times daily.   LANCETS (ONETOUCH ULTRASOFT) LANCETS    Use 1-4 times daily as needed/directed  DX E11.9   METFORMIN (GLUCOPHAGE-XR) 500 MG 24 HR TABLET    TAKE 4 TABLETS EVERY DAY   METOPROLOL (LOPRESSOR) 100 MG TABLET    Take 50-100 mg by mouth See admin instructions. 100 mg in the morning, 50 mg at bedtime   NORTRIPTYLINE (PAMELOR) 10 MG CAPSULE    Take 3 capsules by mouth at bedtime.   ONETOUCH VERIO TEST STRIP    Use 1-4 times daily as directed/needed   DX E11.9   OXYBUTYNIN (DITROPAN-XL) 10 MG 24 HR TABLET    Take 10 mg by mouth at bedtime.   POTASSIUM CHLORIDE ER 20 MEQ TBCR    Take 1 tablet by mouth daily.   ROPINIROLE (REQUIP) 1 MG TABLET    Take 1-2 mg by mouth at bedtime.    ROSUVASTATIN (CRESTOR) 10 MG TABLET    Take 10 mg by mouth 3 (three) times a week. M/ wed/ fri   SEMAGLUTIDE, 2 MG/DOSE, (OZEMPIC, 2 MG/DOSE,) 8 MG/3ML SOPN    INJECT 2MG  EVERY WEEK   TRAZODONE (DESYREL) 50 MG TABLET    Take 50 mg by mouth at bedtime as needed for sleep.   VALSARTAN (DIOVAN) 160 MG TABLET    Take 1 tablet (160 mg total) by mouth daily.   ZINC SULFATE 220 (50 ZN) MG CAPSULE    Take 1 capsule (220 mg total) by mouth daily.  Modified Medications   No medications on file  Discontinued Medications   No medications on file    Allergies Allergies  Allergen Reactions   Other     Steroids   Does not tolerate bad for stomach has to take med before and after if uses any steroid    Penicillins Other (See Comments)    Unknown childhood allergic reaction - no other information available    Past Medical History Past Medical History:  Diagnosis Date   Anemia    iron infusions   Coronary artery disease    Cough syncope  Diabetes mellitus without complication (HCC)    Fibromyalgia    GERD (gastroesophageal reflux disease)    History of kidney stones    HTN (hypertension)     Hyperlipidemia    Hypertension    Morbid obesity (HCC)    Pneumonia    Sleep apnea    dont tolerate cpap   SOB (shortness of breath)    Uncontrolled type 2 diabetes mellitus with complication    Vitamin B12 deficiency     Past Surgical History Past Surgical History:  Procedure Laterality Date   ELBOW SURGERY Right    HERNIA REPAIR     left and right inguinal , umbilical also   ROUX-EN-Y GASTRIC BYPASS     08-2016  something nicked and pt. had to have 3 units of blood   SHOULDER ARTHROSCOPY Right    TRANSURETHRAL RESECTION OF PROSTATE N/A 03/14/2019   Procedure: TRANSURETHRAL RESECTION OF THE PROSTATE (TURP);  Surgeon: Bjorn Pippin, MD;  Location: [facility];  Service: Urology;  Laterality: N/A;    Family History family history includes Diabetes in his father.  Social History Social History   Socioeconomic History   Marital status: Married    Spouse name: Not on file   Number of children: Not on file   Years of education: Not on file   Highest education level: Not on file  Occupational History   Not on file  Tobacco Use   Smoking status: Former   Smokeless tobacco: Former    Types: Chew   Tobacco comments:    30 years ago  Vaping Use   Vaping status: Never Used  Substance and Sexual Activity   Alcohol use: No   Drug use: No   Sexual activity: Not Currently  Other Topics Concern   Not on file  Social History Narrative   Not on file   Social Determinants of Health   Financial Resource Strain: Low Risk  (09/20/2022)   Received from [facility], [facility]   Overall Financial Resource Strain (CARDIA)    Difficulty of Paying Living Expenses: Not hard at all  Food Insecurity: No Food Insecurity (09/20/2022)   Received from [facility], [facility]   Hunger Vital Sign    Worried About Running Out of Food in the Last Year: Never true    Ran Out of Food in the Last Year: Never true  Transportation Needs: No Transportation Needs (09/20/2022)   Received from  [employer], [facility]   PRAPARE - Transportation    Lack of Transportation (Medical): No    Lack of Transportation (Non-Medical): No  Physical Activity: Inactive (09/20/2022)   Received from [facility], [facility]   Exercise Vital Sign    Days of Exercise per Week: 7 days    Minutes of Exercise per Session: 0 min  Stress: No Stress Concern Present (09/20/2022)   Received from [facility], [facility] of Occupational Health - Occupational Stress Questionnaire    Feeling of Stress : Not at all  Social Connections: Socially Integrated (09/20/2022)   Received from [facility], [facility]   Social Network    How would you rate your social network (family, work, friends)?: Good participation with social networks  Recent Concern: Social Connections - Somewhat Isolated (06/27/2022)   Received from [facility]   Social Network    How would you rate your social network (family, work, friends)?: Restricted participation with some degree of social isolation  Intimate Partner Violence: Not At  Risk (09/20/2022)   Received from [facility], [facility]   HITS    Over the last 12 months how often did your partner physically hurt you?: 1    Over the last 12 months how often did your partner insult you or talk down to you?: 1    Over the last 12 months how often did your partner threaten you with physical harm?: 1    Over the last 12 months how often did your partner scream or curse at you?: 1    Lab Results  Component Value Date   HGBA1C 5.9 (A) 01/04/2023   HGBA1C 6.5 09/01/2022   HGBA1C 6.4 04/21/2022   Lab Results  Component Value Date   CHOL 79 01/07/2021   Lab Results  Component Value Date   HDL 25 (L) 01/07/2021   Lab Results  Component Value Date   LDLCALC 25 01/07/2021   Lab Results  Component Value Date   TRIG 143 01/07/2021   Lab Results  Component Value Date   CHOLHDL 3.2 01/07/2021   Lab Results  Component Value Date    CREATININE 0.77 09/01/2022   Lab Results  Component Value Date   GFR 92.07 09/01/2022   Lab Results  Component Value Date   MICROALBUR <0.7 12/03/2021      Component Value Date/Time   NA 139 09/01/2022 0808   K 3.5 09/01/2022 0808   CL 104 09/01/2022 0808   CO2 25 09/01/2022 0808   GLUCOSE 190 (H) 09/01/2022 0808   BUN 12 09/01/2022 0808   CREATININE 0.77 09/01/2022 0808   CALCIUM 9.0 09/01/2022 0808   PROT 6.5 06/24/2021 0550   ALBUMIN 3.2 (L) 06/24/2021 0550   AST 26 06/24/2021 0550   ALT 26 06/24/2021 0550   ALKPHOS 56 06/24/2021 0550   BILITOT 0.5 06/24/2021 0550   GFRNONAA >60 06/24/2021 0550   GFRAA >60 03/10/2019 1443      Latest Ref Rng & Units 09/01/2022    8:08 AM 04/21/2022    8:00 AM 06/24/2021    5:50 AM  BMP  Glucose 70 - 99 mg/dL 161  096  045   BUN 6 - 23 mg/dL 12  15  26    Creatinine 0.40 - 1.50 mg/dL 4.09  8.11  9.14   Sodium 135 - 145 mEq/L 139  137  138   Potassium 3.5 - 5.1 mEq/L 3.5  4.1  3.7   Chloride 96 - 112 mEq/L 104  101  106   CO2 19 - 32 mEq/L 25  27  21    Calcium 8.4 - 10.5 mg/dL 9.0  9.2  8.6        Component Value Date/Time   WBC 8.9 06/24/2021 0550   RBC 6.04 (H) 06/24/2021 0550   HGB 17.0 06/24/2021 0550   HGB 15.7 08/01/2010 1455   HCT 51.3 06/24/2021 0550   HCT 46.7 08/01/2010 1455   PLT 144 (L) 06/24/2021 0550   PLT 173 08/01/2010 1455   MCV 84.9 06/24/2021 0550   MCV 84.3 08/01/2010 1455   MCH 28.1 06/24/2021 0550   MCHC 33.1 06/24/2021 0550   RDW 15.1 06/24/2021 0550   RDW 15.2 (H) 08/01/2010 1455   LYMPHSABS 0.5 (L) 06/24/2021 0550   LYMPHSABS 2.1 08/01/2010 1455   MONOABS 0.3 06/24/2021 0550   MONOABS 0.5 08/01/2010 1455   EOSABS 0.0 06/24/2021 0550   EOSABS 0.4 08/01/2010 1455   BASOSABS 0.0 06/24/2021 0550   BASOSABS 0.1 08/01/2010 1455  Parts of this note may have been dictated using voice recognition software. There may be variances in spelling and vocabulary which are unintentional. Not all errors  are proofread. Please notify the Thereasa Parkin if any discrepancies are noted or if the meaning of any statement is not clear.

## 2023-01-23 ENCOUNTER — Other Ambulatory Visit: Payer: Self-pay | Admitting: Endocrinology

## 2023-01-23 DIAGNOSIS — E1140 Type 2 diabetes mellitus with diabetic neuropathy, unspecified: Secondary | ICD-10-CM

## 2023-01-24 ENCOUNTER — Other Ambulatory Visit: Payer: Self-pay | Admitting: Endocrinology

## 2023-01-29 ENCOUNTER — Encounter: Payer: Self-pay | Admitting: "Endocrinology

## 2023-01-29 LAB — HM DIABETES EYE EXAM

## 2023-02-21 ENCOUNTER — Other Ambulatory Visit: Payer: Self-pay | Admitting: Endocrinology

## 2023-03-26 ENCOUNTER — Other Ambulatory Visit: Payer: Self-pay

## 2023-03-26 MED ORDER — FREESTYLE LIBRE 3 PLUS SENSOR MISC: 3 refills | Status: DC

## 2023-04-20 ENCOUNTER — Other Ambulatory Visit: Payer: Self-pay | Admitting: "Endocrinology

## 2023-04-20 DIAGNOSIS — E1140 Type 2 diabetes mellitus with diabetic neuropathy, unspecified: Secondary | ICD-10-CM

## 2023-06-01 ENCOUNTER — Other Ambulatory Visit: Payer: Self-pay | Admitting: "Endocrinology

## 2023-07-09 ENCOUNTER — Other Ambulatory Visit: Payer: Self-pay

## 2023-07-09 DIAGNOSIS — E1140 Type 2 diabetes mellitus with diabetic neuropathy, unspecified: Secondary | ICD-10-CM

## 2023-07-09 MED ORDER — OZEMPIC (2 MG/DOSE) 8 MG/3ML ~~LOC~~ SOPN
PEN_INJECTOR | SUBCUTANEOUS | 0 refills | Status: DC
Start: 2023-07-09 — End: 2023-09-09

## 2023-07-09 MED ORDER — DAPAGLIFLOZIN PROPANEDIOL 10 MG PO TABS: 10.0000 mg | ORAL_TABLET | Freq: Every day | ORAL | 3 refills | Status: DC

## 2023-07-16 ENCOUNTER — Other Ambulatory Visit: Payer: Self-pay

## 2023-07-24 IMAGING — CT CT ABD-PELV W/ CM
2 of 5 series · 16 of 46 positions shown, 18 images · IV contrast (APPLIED)
Comparison: 01/30/2005

CLINICAL DATA: RIGHT loss, exocrine pancreatic insufficiency,
dyserythropoietic anemia, calvarial hyperostosis syndrome; history
type II diabetes mellitus, hypertension, fibromyalgia, former smoker

EXAM:
CT ABDOMEN AND PELVIS WITH CONTRAST
TECHNIQUE: Multidetector CT imaging of the abdomen and pelvis was performed
using the standard protocol following bolus administration of
intravenous contrast. Sagittal and coronal MPR images reconstructed
from axial data set.
CONTRAST:  80mL OMNIPAQUE IOHEXOL 350 MG/ML SOLN IV. Dilute oral
contrast.

[Series 2: axial st · axial · 0.86mm/px · z∈[-595,-165]mm · 13 of 102 slices shown, 15 images]
[im 8/102  soft-tissue]
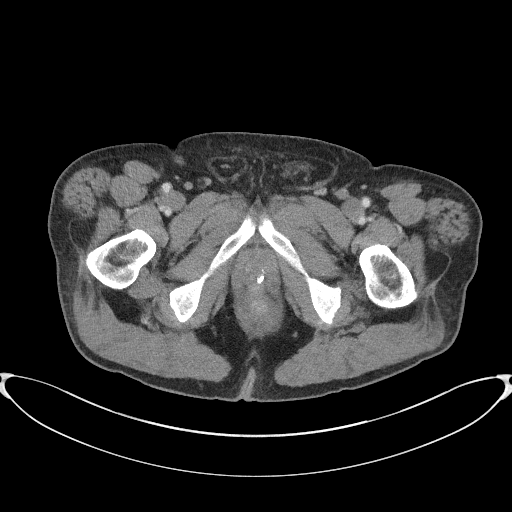
[im 8/102  bone]
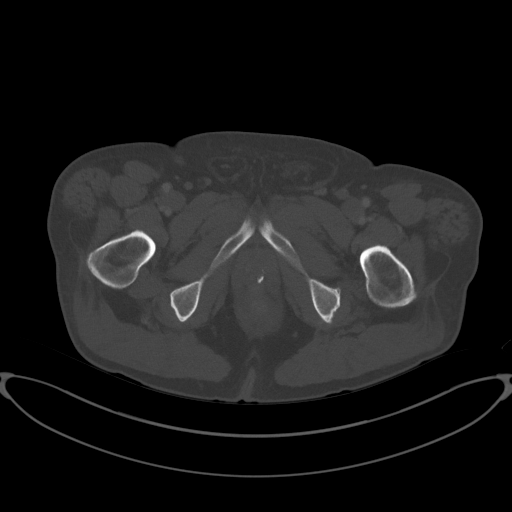
[im 15/102  soft-tissue]
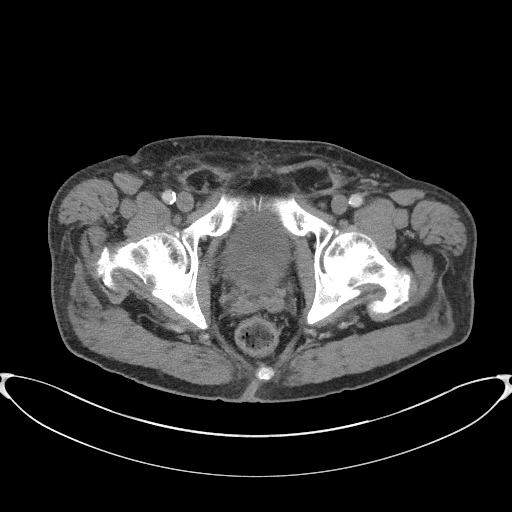
[im 22/102  soft-tissue]
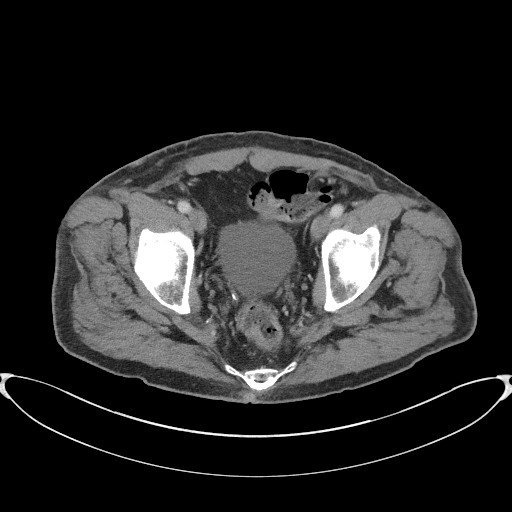
[im 29/102  soft-tissue]
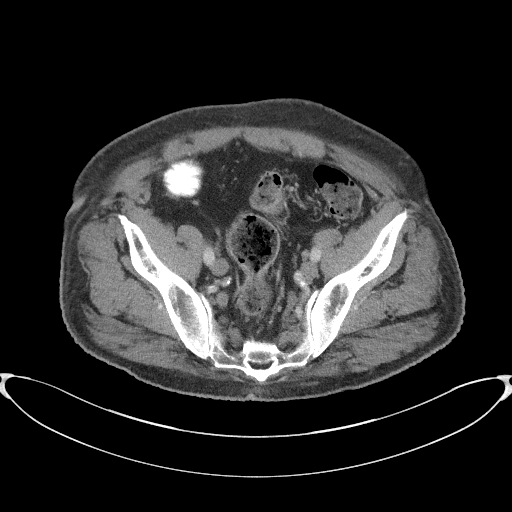
[im 37/102  soft-tissue]
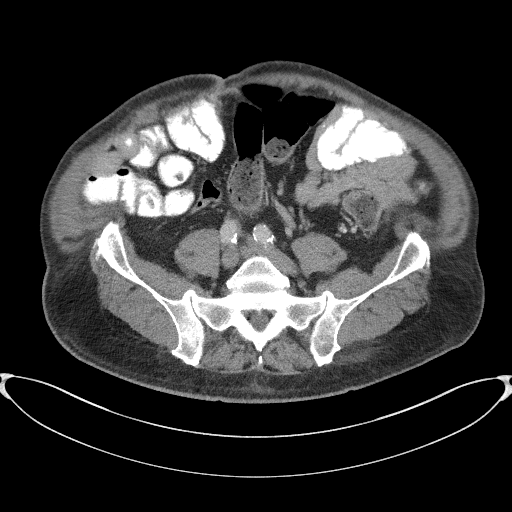
[im 44/102  soft-tissue]
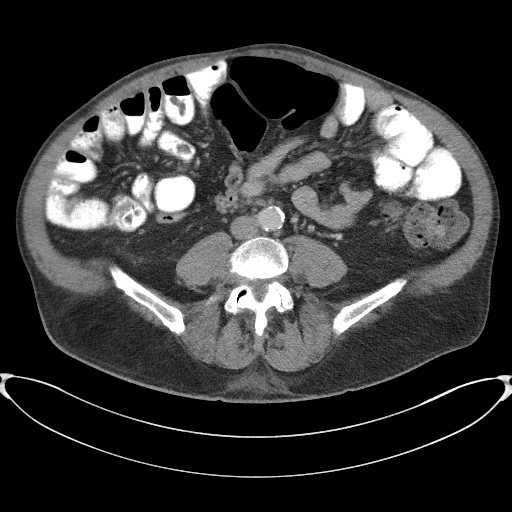
[im 51/102  soft-tissue]
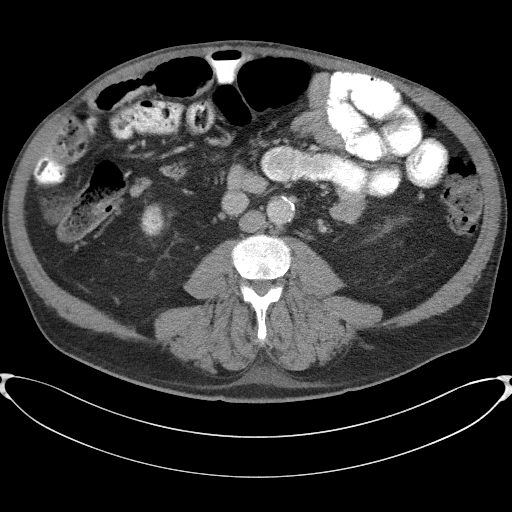
[im 58/102  soft-tissue]
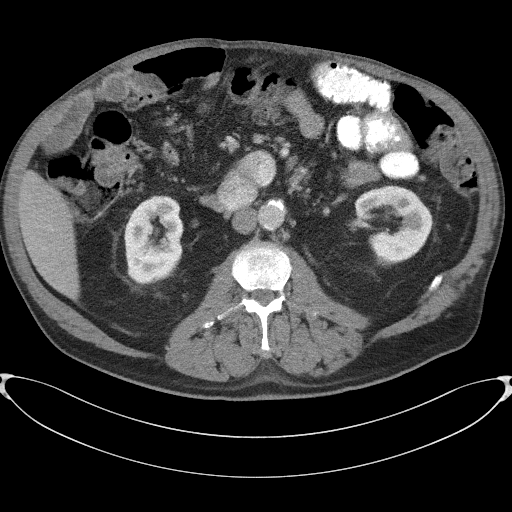
[im 65/102  soft-tissue]
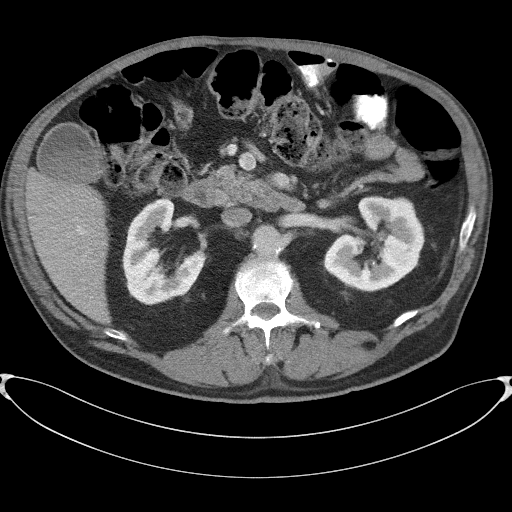
[im 65/102  bone]
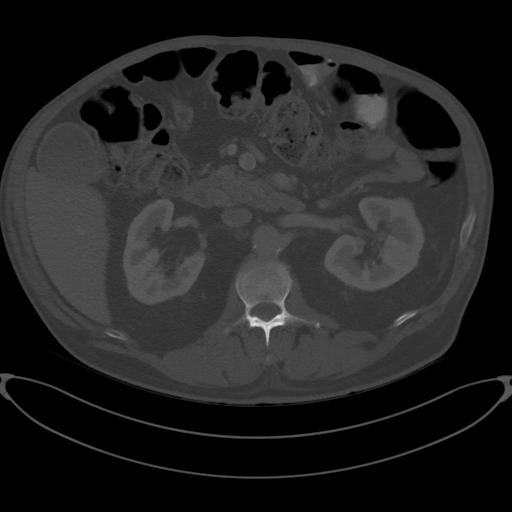
[im 73/102  soft-tissue]
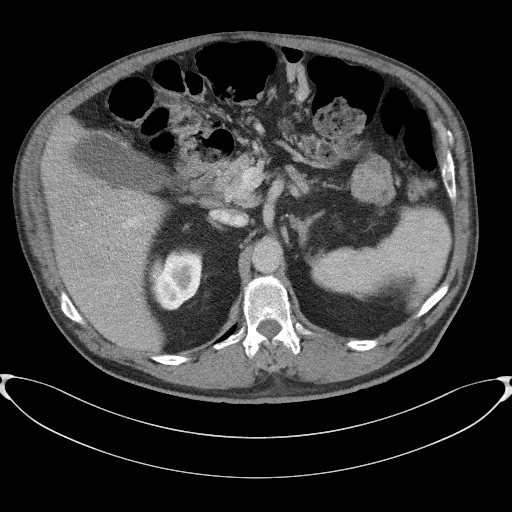
[im 80/102  soft-tissue]
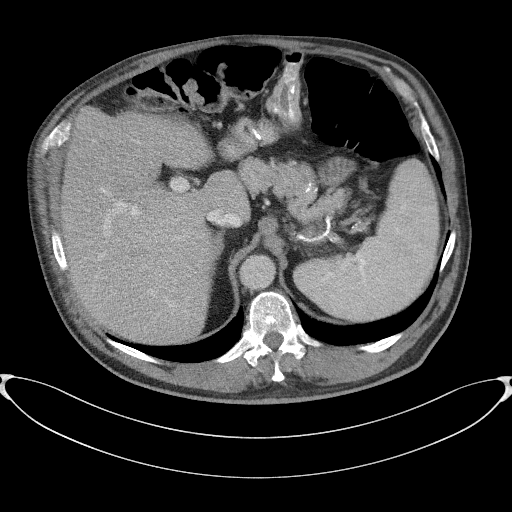
[im 87/102  soft-tissue]
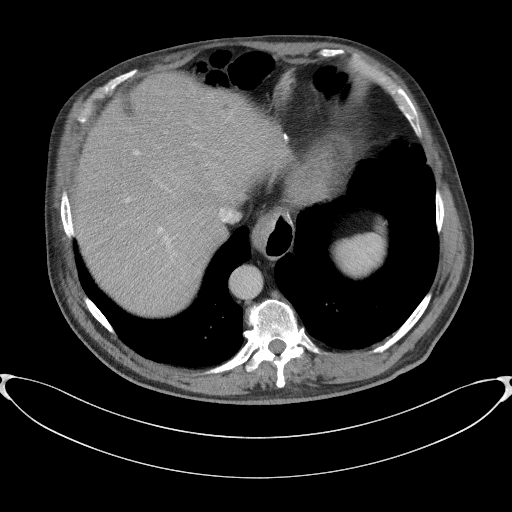
[im 94/102  soft-tissue]
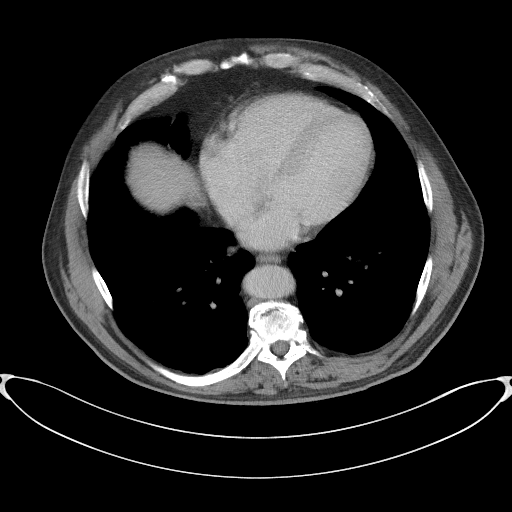

[Series 5: coronal st · coronal · 0.92mm/px · 3 of 106 slices shown]
[im 36/106  soft-tissue]
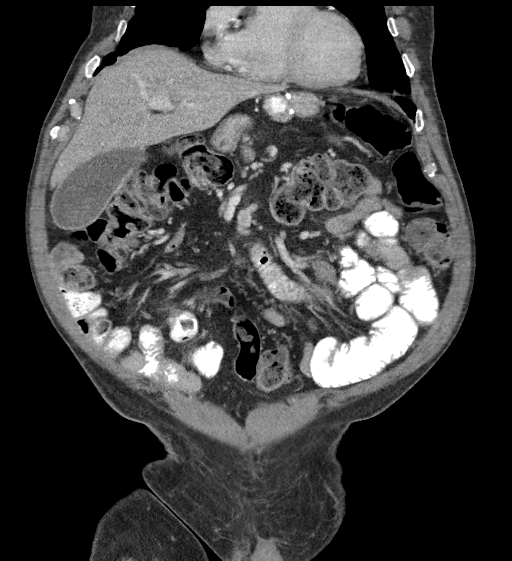
[im 47/106  soft-tissue]
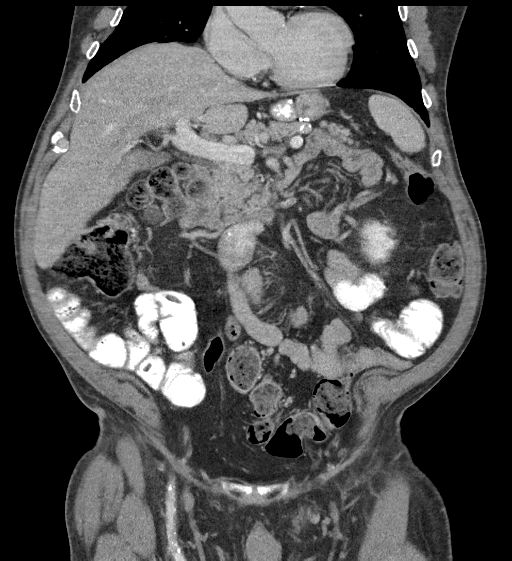
[im 59/106  soft-tissue]
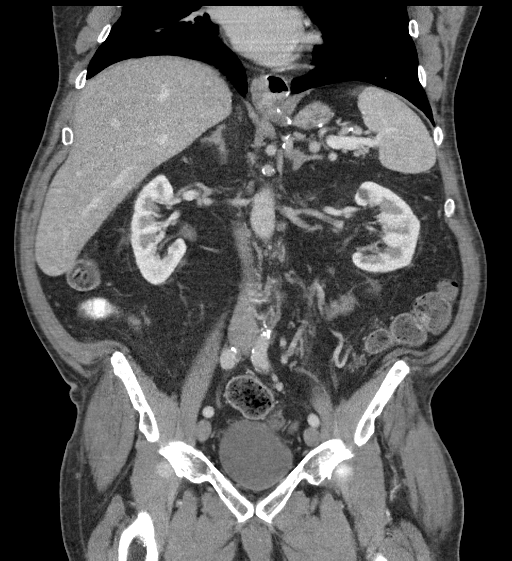

[16 of 46 positions shown; findings below may reference images not displayed]

FINDINGS: Lower chest: Minimal dependent atelectasis RIGHT lung base.

Hepatobiliary: 2 tiny nonspecific low-attenuation foci within liver.
Minimal gallbladder wall thickening. Subtle dependent density in
gallbladder, could represent noncalcified stones or sludge. No
biliary dilatation.

Pancreas: Unremarkable. No mass, calcification or cyst. No ductal
dilatation.

Spleen: Normal appearance, 14.7 x 6.3 x 8.0 cm (volume = 390
cm^3).

Adrenals/Urinary Tract: Adrenal glands normal. Small nonobstructing
BILATERAL renal calculi. No renal mass, hydronephrosis or
hydroureter. Bladder and ureters unremarkable.

Stomach/Bowel: Prior gastric bypass surgery. Appendix not
identified. Large and small bowel loops unremarkable.

Vascular/Lymphatic: Atherosclerotic calcifications aorta, iliac
arteries, coronary arteries. Aorta normal caliber. No adenopathy.

Reproductive: Central defect in enlarged prostate gland consistent
with prior TURP. Prostate gland measures 4.9 x 4.5 x 3.8 cm.

Other: No free air or free fluid. Stranding at the inguinal regions
bilaterally likely reflecting BILATERAL inguinal hernias. Fat is
seen within the inguinal canals bilaterally. No free intraperitoneal
air or fluid.

Musculoskeletal: Demineralized osseous structures. No focal
abnormality.
IMPRESSION: Small nonobstructing BILATERAL renal calculi.

Prior gastric bypass surgery.

BILATERAL inguinal hernias containing fat.

Minimal gallbladder wall thickening with question noncalcified
stones or sludge in gallbladder; consider follow-up ultrasound
evaluation.

Enlarged prostate gland, post TURP.

No other intra-abdominal or intrapelvic abnormalities.

Aortic Atherosclerosis ([id number]-TFC.C).

## 2023-08-12 ENCOUNTER — Other Ambulatory Visit: Payer: Self-pay

## 2023-08-12 DIAGNOSIS — E1140 Type 2 diabetes mellitus with diabetic neuropathy, unspecified: Secondary | ICD-10-CM

## 2023-08-12 MED ORDER — FREESTYLE LIBRE 3 PLUS SENSOR MISC: 3 refills | Status: DC

## 2023-08-13 NOTE — Progress Notes (Signed)
  Medicare AWV  Grant Williams Grant Williams. is a 68 y.o. male who presents for his subsequent annual wellness visit for Medicare.  Clinical documentation was reviewed and is accessible via encounter-level attachments.   Questionnaire reviewed  30 out of 30 MMSE  Any physical exam components or additional concerns beyond the scope of the Annual Wellness Visit may be documented in a separate note within this encounter.  Medicare Required Components     Reviewed and updated this visit by provider: Tobacco  Allergies  Meds  Problems  Med Hx  Surg Hx  Fam Hx       Medicare required assessment: Future risk of substance use disorder / overdose risk of 79% is higher (>=20%).    Patient Care Team: Lyle Jenkins Setters, NP as PCP - General (Nurse Practitioner) Maude FORBES Orange, NP as Nurse Practitioner (Bariatrics) Chi Slater Staff, MD (Pulmonary Diseases) Garnette VEAR Pique, MD as Consulting Physician (Neurology) Joen CHRISTELLA Downing, MD as Consulting Physician (Family Medicine)  Vitals   Vitals:   08/13/23 1343  BP: 132/76  Patient Position: Sitting  Pulse: 82  Resp: 16  Height: 5' 8 (1.727 m)  Weight: 200 lb (90.7 kg)  SpO2: 94%  BMI (Calculated): 30.4  PainSc:   7  PainLoc: Generalized    Disposition   1. Encounter for subsequent annual wellness visit (AWV) in Medicare patient (Primary) 2. Type 2 diabetes mellitus with diabetic neuropathy, without long-term current use of insulin   (*) -     Albumin/Creatinine Ratio, Random Urine; Future -     Hemoglobin A1c; Future -     Lipid Panel; Future; Expected date: 10/08/2023 -     CBC And Differential; Future; Expected date: 09/10/2023 -     PSA; Future 3. Exocrine pancreatic insufficiency, dyserythropoietic anemia, and calvarial hyperostosis syndrome  (*) -     Hemoglobin A1c; Future -     Lipid Panel; Future; Expected date: 10/08/2023 -     CBC And Differential; Future; Expected date: 09/10/2023 -     PSA; Future 4.  Essential hypertension 5. Atherosclerosis of native coronary artery of native heart without angina pectoris 6. Major depression, chronic 7. Thrombocytopenia 8. Screening for malignant neoplasm of prostate -     PSA; Future 9. Skin breakdown -     Misc. Devices MISC; Mix equal parts triamcinolone 0.1% cream 30gm, nystatin cream 30gm and desitin 40% ointment.  Apply to affected area 3 times daily, Normal 10. Colon cancer screening -     Ambulatory referral to Gastroenterology   1 All components completed He is doing extremely well  2-10 See separate note  Follow up in about 6 months (around 02/10/2024) for OV and fasting labs .   Health maintenance issues were discussed with the patient.  A written plan was provided to the patient in the form of patient instructions in the After Visit Summary document.

## 2023-08-26 ENCOUNTER — Telehealth: Payer: Self-pay | Admitting: "Endocrinology

## 2023-08-26 NOTE — Telephone Encounter (Signed)
 Patient wife called and said that patient has Continuous Glucose Sensor (FREESTYLE LIBRE 3 SENSOR) MISC and it will either stop working or lose signal and they said he really needs something else that will work. They said they would like a call back from someone clinical at [phone]

## 2023-08-31 ENCOUNTER — Telehealth: Payer: Self-pay

## 2023-08-31 DIAGNOSIS — E1140 Type 2 diabetes mellitus with diabetic neuropathy, unspecified: Secondary | ICD-10-CM

## 2023-08-31 MED ORDER — DEXCOM G7 SENSOR MISC: 1.0000 | 0 refills | Status: DC

## 2023-08-31 MED ORDER — DEXCOM G7 RECEIVER DEVI: 1.0000 | 0 refills | Status: DC

## 2023-08-31 NOTE — Telephone Encounter (Signed)
 Called pt regarding GCM, informed pt of the two option he had. Dexcom was sent in to the pharmacy for pt.

## 2023-08-31 NOTE — Telephone Encounter (Signed)
   Requested Prescriptions   Signed Prescriptions Disp Refills   Continuous Glucose Sensor (DEXCOM G7 SENSOR) MISC 9 each 0    Sig: 1 Device by Does not apply route continuous.    Authorizing Provider: Altamese Varna    Ordering User: Tiburcio Pea, Wilber Fini N   Continuous Glucose Receiver (DEXCOM G7 RECEIVER) DEVI 1 each 0    Sig: 1 Device by Does not apply route continuous.    Authorizing Provider: Altamese Maunie    Ordering User: Beverely Pace

## 2023-09-06 ENCOUNTER — Telehealth: Payer: Self-pay

## 2023-09-06 NOTE — Telephone Encounter (Signed)
 Error

## 2023-09-07 ENCOUNTER — Other Ambulatory Visit: Payer: Self-pay | Admitting: "Endocrinology

## 2023-09-07 DIAGNOSIS — E1140 Type 2 diabetes mellitus with diabetic neuropathy, unspecified: Secondary | ICD-10-CM

## 2023-09-10 ENCOUNTER — Telehealth: Payer: Self-pay

## 2023-09-10 ENCOUNTER — Telehealth: Payer: Self-pay | Admitting: "Endocrinology

## 2023-09-10 NOTE — Telephone Encounter (Signed)
 Pt has been informed of a message has been sent to the PA team regarding Dexcom G7.

## 2023-09-10 NOTE — Telephone Encounter (Signed)
 Patient's wife, Grant Williams is calling to say that the pharmacy has told her that a prior authorization is needed in order for patient to get his Dexcom G7 Receiver Continuous Glucose Receiver (DEXCOM G7 RECEIVER) DEVI.  Patient is out and needs a refill.

## 2023-09-10 NOTE — Telephone Encounter (Signed)
 Pt needs PA dane for Dexcom G 7.

## 2023-09-13 ENCOUNTER — Other Ambulatory Visit (HOSPITAL_COMMUNITY): Payer: Self-pay

## 2023-09-16 ENCOUNTER — Telehealth: Payer: Self-pay

## 2023-09-16 ENCOUNTER — Other Ambulatory Visit (HOSPITAL_COMMUNITY): Payer: Self-pay

## 2023-09-16 NOTE — Telephone Encounter (Signed)
 Pharmacy Patient Advocate Encounter   Received notification from Pt Calls Messages that prior authorization for Dexcom G7 receiver is required/requested.   Insurance verification completed.   The patient is insured through [employer] [facility] .   Per test claim: PA required; PA started via CoverMyMeds. KEY [id number] . Waiting for clinical questions to populate.

## 2023-09-22 NOTE — Progress Notes (Signed)
 Subjective   HPI:  Patient is here for visit for follow up and treatment of chronic metabolic issues. He is  7 year status post  RNY performed. Weight loss from time of surgery has been 99 lbs.  Past year's visits were reviewed with nutrition, as well any admissions or ER visits. We discussed his long term weight loss goals and the necessary components of hitting protein target every single day, regular weight training exercise, and taking nutritional supplements and their key roles in achieving sustainable success.  Recent labs were reviewed as below with special attention to chronic abnormalities.   Follow-Up Questions:  Comorbidity List After Surgery: Bzd:Ipjazuzd Remission:Insulin  Yes:Non-Insulin  Bzd:HZMI requiring medicine  Bzd:Ybezmopepizfpj  Bzd:Ybezmuzwdpnw - 2 medications Remission:Sleep Apnea   he denies any problems with:  nausea or vomiting no, melena or bloody stools no, diarrhea no, constipation no, dysphagia no, excessive weight loss no.   Please see changes in medications compared to preop.  He is/is not  taking below listed supplements as prescribed in the manual.   PPI- yes, and wants to stay on - will send in prescription in case this is covered, omeprazole 20mg  Citrucel- yes  MVI -yes  Calcium  Citrate -yes  Vitamin D  -yes - 50,000 three times per week Was prior taking his iron supplement, but insurance stopped covering.  Will resent for a different   he  is doing resistance training. We discussed short and long term importance of regular exercise to maximize the metabolic benefits of the operation and lifestyle changes, as well as to prevent or minimize any muscle wasting. In particular, we talked about at least twice a week performing a typically cardio oriented exercise program, while 3 days a week performing resistance training. We also discussed variations on above suggested schedule. Also gave he information about exercising at home while gyms are closed and  / or weather is bad.  he is  Complaining of  chronic pain and some insurance issues with coverage of his diabetes medications and iron   Past Medical History:  Diagnosis Date  . Allergy   . Anxiety   . Arthritis   . Coronary artery disease   . Cough syncope syndrome   . Depression   . Diabetes mellitus (*)    FSBS AVERAGE 105-130 PER PT.  02/25/21  . Fibromyalgia   . History of gastric bypass   . History of sleep apnea    NO CPAP USED  . Hyperlipidemia   . Hypertension   . Hypokalemia   . Iron deficiency 12/26/2018  . MDD (major depressive disorder)   . Morbid obesity (*) 08/25/2016  . NAFLD (nonalcoholic fatty liver disease)   . Neuropathy   . Peripheral sensory neuropathy   . Postgastrectomy malabsorption (*)   . Right carpal tunnel syndrome   . Sleep apnea    no cpap   Past Surgical History:  Procedure Laterality Date  . Carpal tunnel release Right 01/16/2022   RIGHT carpal tunnel release on 01-16-2022  . Carpal tunnel release Left 03/11/2022   LEFT carpal tunnel release  . Elbow surgery Right   . Gastric bypass  08/25/2016  . Hernia repair Bilateral 1990 2008   umbilical and bilateral  . Prostate surgery    . Shoulder surgery  1992   right shoulder  . Trigger finger release  08/11/2022   LEFT LONG   Current Outpatient Medications on File Prior to Visit  Medication Sig Dispense Refill  . acetaminophen  (TYLENOL ) 500 mg tablet Take  one tablet (500 mg dose) by mouth every 6 (six) hours as needed for Pain.    . amLODIPine  besylate (NORVASC ) 10 mg tablet TAKE 1 TABLET BY MOUTH EVERY DAY IN THE MORNING 90 tablet 0  . Calcium  Citrate-Vitamin D  (CALCIUM  CITRATE+D3 PO) Take 1 tablet by mouth 2 (two) times daily.    . [facility] ASPIRIN  LOW DOSE EC tablet TAKE ONE TABLET (81 MG DOSE) BY MOUTH EVERY MORNING. NEEDS APPT BEFORE NEXT REFILL 90 tablet 0  . DULoxetine  HCl (CYMBALTA ) 60 mg capsule Take one capsule (60 mg dose) by mouth after lunch. NEEDS APPT BEFORE NEXT REFILL 90  capsule 0  . ergocalciferol  (VITAMIN D2) 50,000 units CAPS capsule TAKE 1 CAPSULE BY MOUTH 2 TIMES A WEEK Monday and Thursday 24 capsule 2  . FARXIGA  10 MG tablet Take one tablet (10 mg dose) by mouth every morning. Hold 3 days prior to surgery    . FERREX 150 FORTE 150-0.025-1 MG CAPS capsule TAKE 1 CAPSULE BY MOUTH TWICE A DAY 180 capsule 1  . gabapentin  (NEURONTIN ) 800 mg tablet TAKE 1 TABLET BY MOUTH FOUR TIMES A DAY 120 tablet 5  . iron polysaccharides (HYTINIC,FERREX,IFEREX,POLY-IRON) 150 MG capsule TAKE 1 CAPSULE(150 MG) BY MOUTH TWICE DAILY 60 capsule 11  . metFORMIN  ER (GLUCOPHAGE -XR) 500 mg 24 hr tablet Take two tablets (1,000 mg dose) by mouth 2 (two) times daily. 360 tablet 0  . metoprolol  tartrate (LOPRESSOR ) 100 mg tablet TAKE 1 TABLET BY MOUTH EVERY MORNING AND 1/2 TABLET EVERY EVENING 135 tablet 0  . Misc. Devices MISC Mix equal parts triamcinolone 0.1% cream 30gm, nystatin cream 30gm and desitin 40% ointment.  Apply to affected area 3 times daily 1 each 0  . nortriptyline  HCl (PAMELOR ) 10 mg capsule TAKE 3 CAPSULES(30 MG) BY MOUTH AT BEDTIME 270 capsule 3  . omeprazole (PRILOSEC) 20 mg capsule Take one capsule (20 mg dose) by mouth daily. EVERY MORNING    . OZEMPIC , 2 MG/DOSE, 8 MG/3ML SOPN pen Last dose 08/03/2022 Hold 7 days prior to surgery    . Pediatric Multivitamins-Iron (FLINTSTONES COMPLETE PO) Take 2 tablets by mouth every morning.    . rOPINIRole  (REQUIP ) 1 mg tablet TAKE 1 TO 2 TABS BY MOUTH AT BEDTIME AS NEEDED FOR RESTLESS LEGS 180 tablet 1  . rosuvastatin  calcium  (CRESTOR ) 10 mg tablet TAKE 1 TABLET AT BEDTIME ON MONDAY WEDNESDAY AND FRIDAY. NEEDS APPT BEFORE NEXT REFILL 36 tablet 1  . traZODone  (DESYREL ) 50 mg tablet TAKE 1 AND 1/2 TABLETS DAILY BY MOUTH AT BEDTIME 135 tablet 0  . valsartan  (DIOVAN ) 160 MG tablet TAKE 1 TABLET BY MOUTH EVERY DAY 90 tablet 0   Current Facility-Administered Medications on File Prior to Visit  Medication Dose Route Frequency Provider  Last Rate Last Admin  . dexamethasone sodium phosphate (DECADRON) injection 2 mg  2 mg Intra-articular Once PRN Delon GORMAN Simpers, MD   2 mg at 10/16/22 9162   Allergies: Allergies  Allergen Reactions  . Advil Other    Gastric bypass  . Other Diarrhea, Nausea And Vomiting and Abdominal Pain    Steroids; due to gastric bypass surgery  . Penicillins Other    unknown   Social History   Socioeconomic History  . Marital status: Married  Occupational History  . Occupation: disabled- truck Hospital doctor  Tobacco Use  . Smoking status: Every Day    Types: Cigars    Start date: 06/23/2020    Passive exposure: Current  . Smokeless tobacco: Former  Types: [place]    Quit date: 07/2016  . Tobacco comments:    maybe 1 time weekly cigar  Vaping Use  . Vaping status: Never Used  Substance and Sexual Activity  . Alcohol use: No  . Drug use: No  . Sexual activity: Yes    Partners: Female   Family History  Problem Relation Age of Onset  . Hypertension Mother   . Lung cancer Father 72  . Hypertension Father   . Diabetes Father   . Heart attack Father   . Arthritis Father   . Diabetes Sister   . Other Daughter        car accident resulting in TBI  . No Known Problems Son   . Heart attack Paternal Uncle 60  . Heart attack Paternal Uncle        40   ROS: Vitamin Deficiencies denies evidence of calcium  deficiency such as altered mental status, tetanus, generalized weakness denies evidence of L- Carnitine deficiency such as lipid intolerance, encephalopathy denies evidence of cobalamin deficiency such as general weakness, anemia denies evidence of copper deficiency including fatigue, bleeding under the skin, anemia, cardiomegaly denies evidence of folate deficiency including generalized weakness, anemia GI discomfort denies evidence of iron deficiency including fatigue shortness of breath malaise denies evidence of thiamine deficiency such as numbness in the fingers or toes, neuropathy,  irritation, encephalopathy denies evidence of vitamin A deficiency such as night blindness denies evidence of vitamin D  deficiency such as bone or joint pain, depression denies evidence of zinc  deficiency such as skin disorders or hair loss  Objective  BP 134/83 (BP Location: Left Upper Arm)   Pulse 64   Ht 5' 8 (1.727 m)   Wt 187 lb (84.8 kg)   SpO2 99%   BMI 28.43 kg/m   Physical Examination:  GENERAL ASSESSMENT: well developed and well nourished, obese SKIN: normal color, no lesions, non-icteric, no petechiae, no ecchymoses HEAD: normocephalic, atraumatic EYES: non-icteric sclerae EARS: normal external appearance NOSE: normal external appearance and nares patent CHEST: normal air exchange, respiratory effort normal with no retractions EXTREMITY: normal and symmetric movement, normal range of motion, no joint swelling NEURO: strength normal and symmetric, alert and oriented X 3, gait normal Psycho/social: normal affect and behavior  Lab on 09/08/2023  Component Date Value Ref Range Status  . Vitamin B1 09/08/2023 185.1  66.5 - 200.0 nmol/L Final  . Vit D, 25-Hydroxy 09/08/2023 25.2 (L)  30.0 - 100.0 ng/mL Final  . Vitamin B-12 09/08/2023 699  232 - 1,245 pg/mL Final  . Folate 09/08/2023 16.8  >3.0 ng/mL Final  . Ferritin 09/08/2023 26 (L)  30 - 400 ng/mL Final  . Glucose 09/08/2023 117 (H)  70 - 99 mg/dL Final  . BUN 96/80/7974 16  8 - 27 mg/dL Final  . Creatinine 96/80/7974 0.85  0.76 - 1.27 mg/dL Final  . eGFR 96/80/7974 94  >59 mL/min/1.73 Final  . Interpretation 09/08/2023 Comment   Final  . BUN/Creatinine Ratio 09/08/2023 19  10 - 24 Final  . Sodium 09/08/2023 147 (H)  134 - 144 mmol/L Final  . Potassium 09/08/2023 5.3 (H)  3.5 - 5.2 mmol/L Final  . Chloride 09/08/2023 106  96 - 106 mmol/L Final  . CO2 09/08/2023 24  20 - 29 mmol/L Final  . CALCIUM  09/08/2023 9.6  8.6 - 10.2 mg/dL Final  . Total Protein 09/08/2023 5.9 (L)  6.0 - 8.5 g/dL Final  . Albumin, Serum  09/08/2023 4.5  3.9 - 4.9  g/dL Final  . Globulin, Total 09/08/2023 1.4 (L)  1.5 - 4.5 g/dL Final  . Total Bilirubin 09/08/2023 0.5  0.0 - 1.2 mg/dL Final  . Alkaline Phosphatase 09/08/2023 67  44 - 121 IU/L Final  . AST 09/08/2023 27  0 - 40 IU/L Final  . ALT (SGPT) 09/08/2023 23  0 - 44 IU/L Final  . WBC 09/08/2023 6.1  3.4 - 10.8 x10E3/uL Final  . RBC 09/08/2023 6.52 (H)  4.14 - 5.80 x10E6/uL Final  . Hemoglobin 09/08/2023 18.0 (H)  13.0 - 17.7 g/dL Final  . Hematocrit 96/80/7974 55.0 (H)  37.5 - 51.0 % Final  . MCV 09/08/2023 84  79 - 97 fL Final  . MCH 09/08/2023 27.6  26.6 - 33.0 pg Final  . MCHC 09/08/2023 32.7  31.5 - 35.7 g/dL Final  . RDW 96/80/7974 14.8  11.6 - 15.4 % Final  . Platelet Count 09/08/2023 133 (L)  150 - 450 x10E3/uL Final  . Neutrophils 09/08/2023 63  Not Estab. % Final  . Lymphs Relative 09/08/2023 21  Not Estab. % Final  . Monocytes 09/08/2023 7  Not Estab. % Final  . Eos Relative 09/08/2023 7  Not Estab. % Final  . Basos Relative  09/08/2023 1  Not Estab. % Final  . Neutrophils Absolute 09/08/2023 3.9  1.4 - 7.0 x10E3/uL Final  . Lymphocytes Absolute 09/08/2023 1.3  0.7 - 3.1 x10E3/uL Final  . Monocytes Absolute 09/08/2023 0.4  0.1 - 0.9 x10E3/uL Final  . Eosinophils Absolute 09/08/2023 0.4  0.0 - 0.4 x10E3/uL Final  . Basophils Absolute 09/08/2023 0.0  0.0 - 0.2 x10E3/uL Final  . Immature Granulocytes 09/08/2023 1  Not Estab. % Final  . Immature Grans (Abs) 09/08/2023 0.0  0.0 - 0.1 x10E3/uL Final   Assessment   1. History of Roux-en-Y gastric bypass   2. Postgastrectomy malabsorption (*)   3. Type 2 diabetes mellitus with diabetic neuropathy, without long-term current use of insulin  (*)   4. Gastroesophageal reflux disease, unspecified whether esophagitis present   5. Sleep apnea, unspecified type   6. Mixed hyperlipidemia   7. NAFLD (nonalcoholic fatty liver disease)   8. Essential hypertension   9. BMI 28.0-28.9,adult    Plan  Zachary Lacks  [name]. has done well post bariatric surgery. he is status post laparoscopic roux en y gastric bypass at [facility]. he has been compliant with our bariatric post operative requirements.  Current BMI is Body mass index is 28.43 kg/m..  he has had resolution or, at least, improvement of his obesity related co-morbidities.  Labs reviewed today and show chronic low platelet count, vitamin D  level now low at 25.2 (last year was 47), ferritin low at 26 (dropped from 34 last year and 48 2 years ago) without anemia.  CMP with low protein 5.9.  PCP updated A1c 6.2.  Lipid panel with triglycerides 182 and HDL 38 otherwise normal.  he has obesity-related medical problems including:  1. History of Roux-en-Y gastric bypass   2. Postgastrectomy malabsorption (*)   3. Type 2 diabetes mellitus with diabetic neuropathy, without long-term current use of insulin  (*)   4. Gastroesophageal reflux disease, unspecified whether esophagitis present   5. Sleep apnea, unspecified type   6. Mixed hyperlipidemia   7. NAFLD (nonalcoholic fatty liver disease)   8. Essential hypertension   9. BMI 28.0-28.9,adult    .  Exercise, including three times per week resistance training stressed along with at least twice per week of  cardio related exercise. We discussed in detail the differences and advantages of each..  Protein intake of 80+ gms per day stressed. Offered if patient had any concerns or confusion that we arrange follow up appointment with nutritionist.  Will resend for oral iron of different brand.  If not covered will refer to hematology for IV iron  Refills sent for vitamin D  and omeprazole  Follow up in 1 year with labs.  Lifestyle and supplementation per AVS.   I have reviewed the discharge plans and course since discharge. After discussion, the patient understands these plans and questions related to the plans have been addressed

## 2023-09-24 ENCOUNTER — Telehealth: Payer: Self-pay

## 2023-09-24 NOTE — Telephone Encounter (Signed)
 Pt is request an appeal be done for his dexcom.

## 2023-09-27 NOTE — Progress Notes (Signed)
 One Call Questionnaire reviewed.

## 2023-10-05 ENCOUNTER — Other Ambulatory Visit: Payer: Self-pay

## 2023-10-05 ENCOUNTER — Other Ambulatory Visit: Payer: Self-pay | Admitting: "Endocrinology

## 2023-10-05 ENCOUNTER — Telehealth: Payer: Self-pay

## 2023-10-05 MED ORDER — FREESTYLE LIBRE 3 PLUS SENSOR MISC: 3 refills | Status: AC

## 2023-10-05 NOTE — Telephone Encounter (Signed)
 Spoke to pt regarding Dexcom PA getting denied. Pt would like to try libre 3+.

## 2023-10-05 NOTE — Progress Notes (Signed)
    Grant Williams [name]. had an OUTPATIENT COLNOSCOPY on 10/05/2023. The full report can be found under the PROCEDURES tab.  IMPRESSIONS and PLAN: Poor prep Large polyp removed from the ascending colon Recall colonoscopy in 1 year with super mag prep  Follow up for scheduled procedure(s).   Electronically Signed: Lonni Bowers, MD 10/05/2023 2:21 PM

## 2023-10-11 ENCOUNTER — Other Ambulatory Visit (HOSPITAL_COMMUNITY): Payer: Self-pay

## 2023-10-11 NOTE — Telephone Encounter (Signed)
 PA not needed-patient changed to Freestyle per chart notes

## 2023-10-26 IMAGING — DX DG CHEST 1V PORT
1 series · 1 of 1 positions shown · non-contrast
Comparison: Chest x-ray 01/06/2021

CLINICAL DATA: Shortness of breath.

EXAM:
PORTABLE CHEST 1 VIEW

[chest ap]
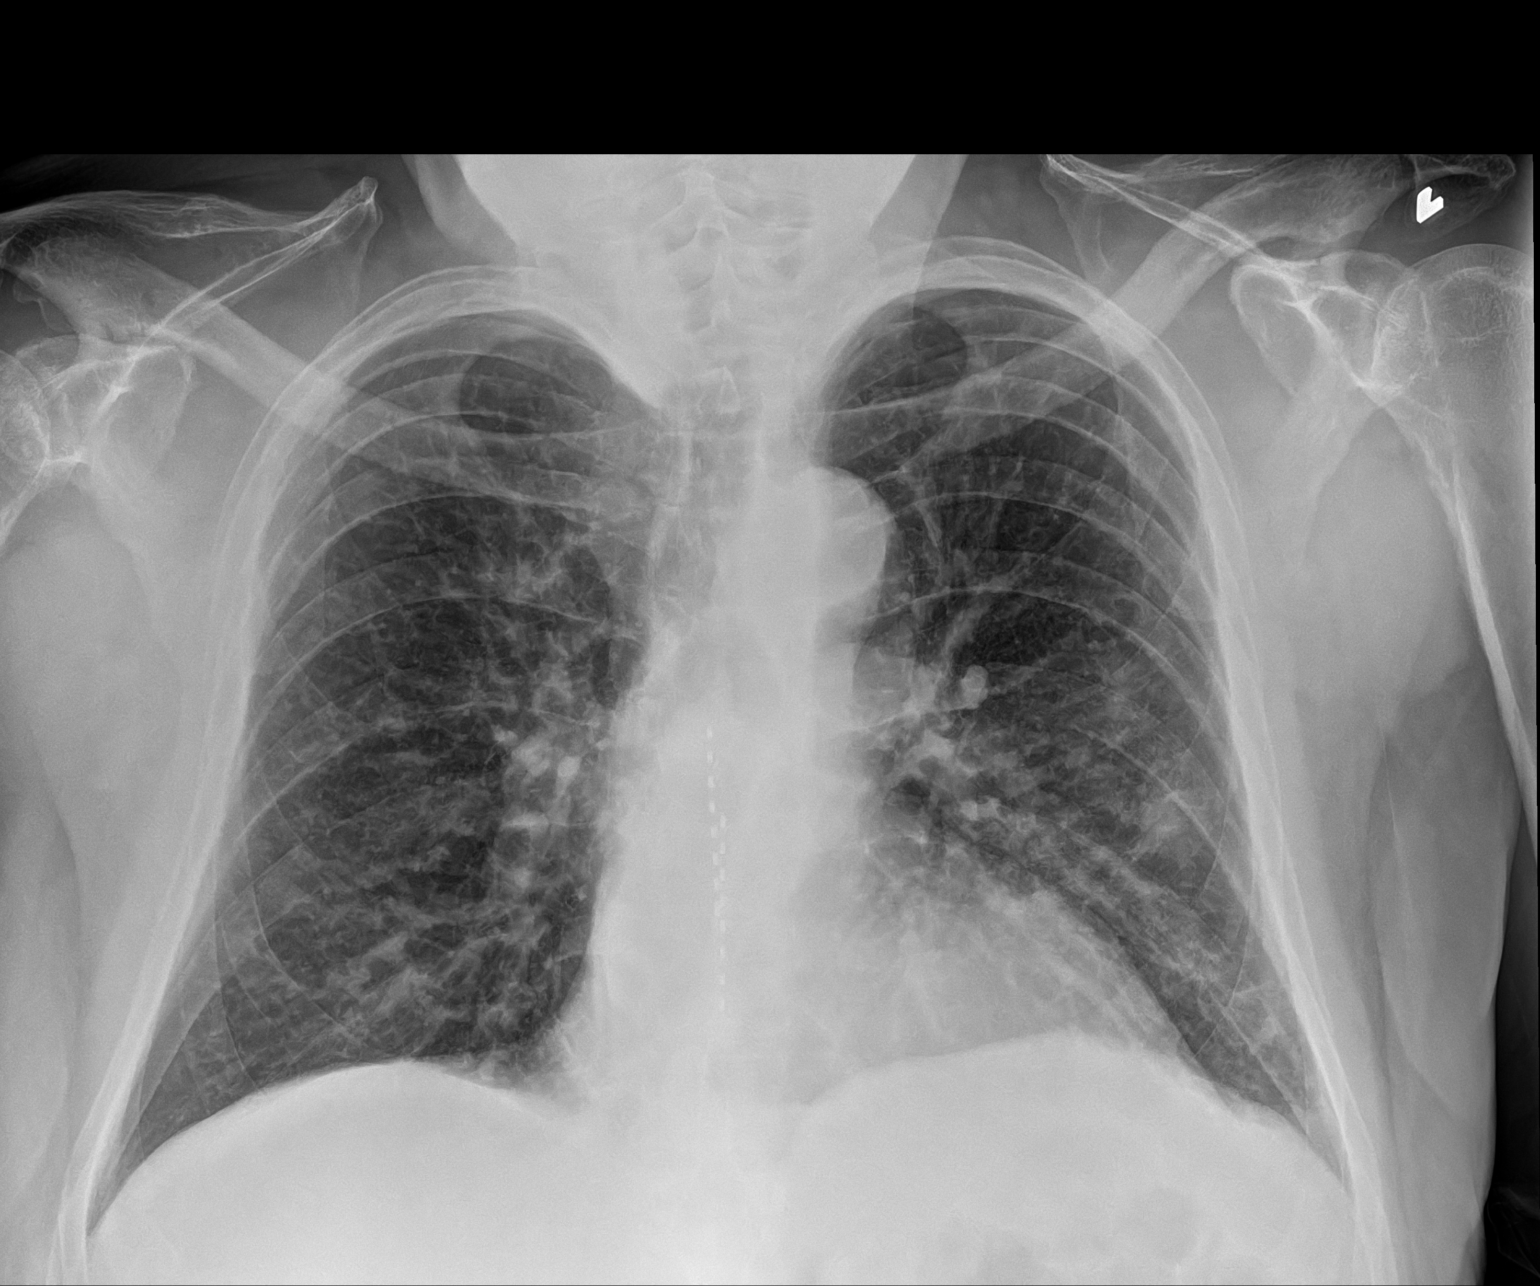

[1 of 1 positions shown; findings below may reference images not displayed]

FINDINGS: There is some strandy and patchy opacities in the left lower lung
worrisome for infection. Costophrenic angles are clear. There is no
pneumothorax. The heart is mildly enlarged, unchanged. Mediastinal
silhouette is stable. Thoracic spinal catheter is present. No acute
fractures are seen.
IMPRESSION: 1 patchy and strandy opacities in the left lower lung worrisome for
infection. Followup PA and lateral chest X-ray is recommended in 3-4
weeks following trial of antibiotic therapy to ensure resolution and
exclude underlying malignancy.

## 2023-12-27 ENCOUNTER — Other Ambulatory Visit: Payer: Self-pay | Admitting: "Endocrinology

## 2023-12-27 DIAGNOSIS — E1140 Type 2 diabetes mellitus with diabetic neuropathy, unspecified: Secondary | ICD-10-CM

## 2024-01-15 ENCOUNTER — Encounter (HOSPITAL_COMMUNITY): Payer: Self-pay

## 2024-01-15 ENCOUNTER — Emergency Department (HOSPITAL_COMMUNITY)

## 2024-01-15 ENCOUNTER — Encounter (HOSPITAL_COMMUNITY)
Admission: EM | Disposition: A | Discharge status: Home / Self Care | Payer: Self-pay | Source: Home / Self Care | Attending: Internal Medicine

## 2024-01-15 ENCOUNTER — Other Ambulatory Visit: Payer: Self-pay

## 2024-01-15 ENCOUNTER — Inpatient Hospital Stay (HOSPITAL_COMMUNITY)
Admission: EM | Admit: 2024-01-15 | Discharge: 2024-01-17 | DRG: 321 | Disposition: A | Discharge status: Home / Self Care | Attending: Cardiology | Admitting: Cardiology

## 2024-01-15 DIAGNOSIS — Z8249 Family history of ischemic heart disease and other diseases of the circulatory system: Secondary | ICD-10-CM | POA: Diagnosis not present

## 2024-01-15 DIAGNOSIS — G2581 Restless legs syndrome: Secondary | ICD-10-CM | POA: Diagnosis present

## 2024-01-15 DIAGNOSIS — I2111 ST elevation (STEMI) myocardial infarction involving right coronary artery: Secondary | ICD-10-CM | POA: Diagnosis not present

## 2024-01-15 DIAGNOSIS — Z7985 Long-term (current) use of injectable non-insulin antidiabetic drugs: Secondary | ICD-10-CM

## 2024-01-15 DIAGNOSIS — Z79899 Other long term (current) drug therapy: Secondary | ICD-10-CM | POA: Diagnosis not present

## 2024-01-15 DIAGNOSIS — M797 Fibromyalgia: Secondary | ICD-10-CM | POA: Diagnosis present

## 2024-01-15 DIAGNOSIS — E876 Hypokalemia: Secondary | ICD-10-CM | POA: Diagnosis present

## 2024-01-15 DIAGNOSIS — Z880 Allergy status to penicillin: Secondary | ICD-10-CM | POA: Diagnosis not present

## 2024-01-15 DIAGNOSIS — I10 Essential (primary) hypertension: Secondary | ICD-10-CM | POA: Diagnosis present

## 2024-01-15 DIAGNOSIS — Z87442 Personal history of urinary calculi: Secondary | ICD-10-CM | POA: Diagnosis not present

## 2024-01-15 DIAGNOSIS — Z9884 Bariatric surgery status: Secondary | ICD-10-CM | POA: Diagnosis not present

## 2024-01-15 DIAGNOSIS — G4730 Sleep apnea, unspecified: Secondary | ICD-10-CM | POA: Diagnosis present

## 2024-01-15 DIAGNOSIS — E118 Type 2 diabetes mellitus with unspecified complications: Secondary | ICD-10-CM | POA: Diagnosis present

## 2024-01-15 DIAGNOSIS — N179 Acute kidney failure, unspecified: Secondary | ICD-10-CM | POA: Diagnosis present

## 2024-01-15 DIAGNOSIS — I213 ST elevation (STEMI) myocardial infarction of unspecified site: Secondary | ICD-10-CM | POA: Diagnosis present

## 2024-01-15 DIAGNOSIS — I2510 Atherosclerotic heart disease of native coronary artery without angina pectoris: Secondary | ICD-10-CM | POA: Diagnosis present

## 2024-01-15 DIAGNOSIS — I252 Old myocardial infarction: Secondary | ICD-10-CM | POA: Diagnosis not present

## 2024-01-15 DIAGNOSIS — Z9079 Acquired absence of other genital organ(s): Secondary | ICD-10-CM | POA: Diagnosis not present

## 2024-01-15 DIAGNOSIS — G4700 Insomnia, unspecified: Secondary | ICD-10-CM | POA: Diagnosis present

## 2024-01-15 DIAGNOSIS — Z7982 Long term (current) use of aspirin: Secondary | ICD-10-CM

## 2024-01-15 DIAGNOSIS — E7800 Pure hypercholesterolemia, unspecified: Secondary | ICD-10-CM | POA: Diagnosis present

## 2024-01-15 DIAGNOSIS — F17290 Nicotine dependence, other tobacco product, uncomplicated: Secondary | ICD-10-CM | POA: Diagnosis present

## 2024-01-15 DIAGNOSIS — Z833 Family history of diabetes mellitus: Secondary | ICD-10-CM

## 2024-01-15 DIAGNOSIS — Z7984 Long term (current) use of oral hypoglycemic drugs: Secondary | ICD-10-CM

## 2024-01-15 DIAGNOSIS — R001 Bradycardia, unspecified: Secondary | ICD-10-CM | POA: Diagnosis present

## 2024-01-15 DIAGNOSIS — K219 Gastro-esophageal reflux disease without esophagitis: Secondary | ICD-10-CM | POA: Diagnosis present

## 2024-01-15 DIAGNOSIS — Z888 Allergy status to other drugs, medicaments and biological substances status: Secondary | ICD-10-CM | POA: Diagnosis not present

## 2024-01-15 DIAGNOSIS — I2119 ST elevation (STEMI) myocardial infarction involving other coronary artery of inferior wall: Secondary | ICD-10-CM | POA: Diagnosis present

## 2024-01-15 DIAGNOSIS — Z955 Presence of coronary angioplasty implant and graft: Secondary | ICD-10-CM

## 2024-01-15 HISTORY — PX: CORONARY/GRAFT ACUTE MI REVASCULARIZATION: CATH118305

## 2024-01-15 HISTORY — PX: LEFT HEART CATH AND CORONARY ANGIOGRAPHY: CATH118249

## 2024-01-15 LAB — POCT ACTIVATED CLOTTING TIME
Activated Clotting Time: 285 s
Activated Clotting Time: 233 s
Activated Clotting Time: 349 s
Activated Clotting Time: >1000 s

## 2024-01-15 LAB — BASIC METABOLIC PANEL WITH GFR
Anion gap: 16 — ABNORMAL HIGH (ref 5–15)
BUN: 15 mg/dL (ref 8–23)
CO2: 23 mmol/L (ref 22–32)
Calcium: 10 mg/dL (ref 8.9–10.3)
Chloride: 101 mmol/L (ref 98–111)
Creatinine, Ser: 1.22 mg/dL (ref 0.61–1.24)
GFR, Estimated: >60 mL/min (ref >60)
Glucose, Bld: 111 mg/dL — ABNORMAL HIGH (ref 70–99)
Potassium: 3.6 mmol/L (ref 3.5–5.1)
Sodium: 140 mmol/L (ref 135–145)

## 2024-01-15 LAB — CBC
HCT: 56.1 % — ABNORMAL HIGH (ref 39.0–52.0)
Hemoglobin: 19.2 g/dL — ABNORMAL HIGH (ref 13.0–17.0)
MCH: 29.3 pg (ref 26.0–34.0)
MCHC: 34.2 g/dL (ref 30.0–36.0)
MCV: 85.6 fL (ref 80.0–100.0)
Platelets: 147 K/uL — ABNORMAL LOW (ref 150–400)
RBC: 6.55 MIL/uL — ABNORMAL HIGH (ref 4.22–5.81)
RDW: 14.9 % (ref 11.5–15.5)
WBC: 9.1 K/uL (ref 4.0–10.5)
nRBC: 0 % (ref 0.0–0.2)

## 2024-01-15 LAB — PROTIME-INR
INR: 1 (ref 0.8–1.2)
Prothrombin Time: 13.8 s (ref 11.4–15.2)

## 2024-01-15 LAB — CG4 I-STAT (LACTIC ACID): Lactic Acid, Venous: 1 mmol/L (ref 0.5–1.9)

## 2024-01-15 LAB — TROPONIN I (HIGH SENSITIVITY): Troponin I (High Sensitivity): 17 ng/L (ref <18)

## 2024-01-15 LAB — APTT: aPTT: 33 s (ref 24–36)

## 2024-01-15 SURGERY — CORONARY/GRAFT ACUTE MI REVASCULARIZATION
Anesthesia: LOCAL

## 2024-01-15 MED ORDER — ASPIRIN 81 MG PO CHEW
CHEWABLE_TABLET | ORAL | Status: AC
  Filled 2024-01-15: qty 4

## 2024-01-15 MED ORDER — ARIPIPRAZOLE 5 MG PO TABS: 5.0000 mg | ORAL_TABLET | Freq: Every day | ORAL | Status: DC

## 2024-01-15 MED ORDER — TICAGRELOR 90 MG PO TABS
90.0000 mg | ORAL_TABLET | Freq: Two times a day (BID) | ORAL | Status: DC
  Administered 2024-01-16 – 2024-01-17 (×3): 90 mg via ORAL
  Filled 2024-01-15 (×3): qty 1

## 2024-01-15 MED ORDER — TIROFIBAN (AGGRASTAT) BOLUS VIA INFUSION
INTRAVENOUS | Status: DC | PRN
  Administered 2024-01-15: 2040 ug via INTRAVENOUS

## 2024-01-15 MED ORDER — TIROFIBAN HCL IN NACL 5-0.9 MG/100ML-% IV SOLN
INTRAVENOUS | Status: AC | PRN
  Administered 2024-01-15: .075 ug/kg/min via INTRAVENOUS

## 2024-01-15 MED ORDER — ORAL CARE MOUTH RINSE: 15.0000 mL | OROMUCOSAL | Status: DC | PRN

## 2024-01-15 MED ORDER — MIDAZOLAM HCL 2 MG/2ML IJ SOLN
INTRAMUSCULAR | Status: AC
  Filled 2024-01-15: qty 2

## 2024-01-15 MED ORDER — TRAZODONE HCL 50 MG PO TABS: 50.0000 mg | ORAL_TABLET | Freq: Every evening | ORAL | Status: DC | PRN

## 2024-01-15 MED ORDER — ASPIRIN 81 MG PO TBEC
81.0000 mg | DELAYED_RELEASE_TABLET | Freq: Every day | ORAL | Status: DC
  Administered 2024-01-16 – 2024-01-17 (×2): 81 mg via ORAL
  Filled 2024-01-15 (×2): qty 1

## 2024-01-15 MED ORDER — ONDANSETRON HCL 4 MG/2ML IJ SOLN: 4.0000 mg | Freq: Four times a day (QID) | INTRAMUSCULAR | Status: DC | PRN

## 2024-01-15 MED ORDER — CARVEDILOL 12.5 MG PO TABS
12.5000 mg | ORAL_TABLET | Freq: Two times a day (BID) | ORAL | Status: DC
  Administered 2024-01-16 (×2): 12.5 mg via ORAL
  Filled 2024-01-15 (×2): qty 1

## 2024-01-15 MED ORDER — HEPARIN (PORCINE) IN NACL 1000-0.9 UT/500ML-% IV SOLN
INTRAVENOUS | Status: DC | PRN
  Administered 2024-01-15: 500 mL

## 2024-01-15 MED ORDER — FENTANYL CITRATE (PF) 100 MCG/2ML IJ SOLN
50.0000 ug | Freq: Once | INTRAMUSCULAR | Status: AC
  Administered 2024-01-15: 50 ug via INTRAVENOUS
  Filled 2024-01-15: qty 2

## 2024-01-15 MED ORDER — HYDRALAZINE HCL 20 MG/ML IJ SOLN: 10.0000 mg | INTRAMUSCULAR | Status: DC | PRN

## 2024-01-15 MED ORDER — LIDOCAINE HCL (PF) 1 % IJ SOLN
INTRAMUSCULAR | Status: AC
  Filled 2024-01-15: qty 30

## 2024-01-15 MED ORDER — HEPARIN (PORCINE) 25000 UT/250ML-% IV SOLN: 14.0000 [IU]/kg/h | INTRAVENOUS | Status: DC

## 2024-01-15 MED ORDER — FENTANYL CITRATE (PF) 100 MCG/2ML IJ SOLN
INTRAMUSCULAR | Status: AC
  Filled 2024-01-15: qty 2

## 2024-01-15 MED ORDER — ASPIRIN 81 MG PO CHEW
324.0000 mg | CHEWABLE_TABLET | Freq: Once | ORAL | Status: AC
  Administered 2024-01-15: 324 mg via ORAL

## 2024-01-15 MED ORDER — AMLODIPINE BESYLATE 5 MG PO TABS
10.0000 mg | ORAL_TABLET | Freq: Every day | ORAL | Status: DC
  Administered 2024-01-16 – 2024-01-17 (×2): 10 mg via ORAL
  Filled 2024-01-15 (×2): qty 2

## 2024-01-15 MED ORDER — SODIUM CHLORIDE 0.9 % IV BOLUS
500.0000 mL | Freq: Once | INTRAVENOUS | Status: AC
  Administered 2024-01-15: 500 mL via INTRAVENOUS

## 2024-01-15 MED ORDER — NITROGLYCERIN IN D5W 200-5 MCG/ML-% IV SOLN: 2.0000 ug/min | INTRAVENOUS | Status: DC

## 2024-01-15 MED ORDER — TICAGRELOR 90 MG PO TABS
ORAL_TABLET | ORAL | Status: DC | PRN
  Administered 2024-01-15: 180 mg via ORAL

## 2024-01-15 MED ORDER — MIDAZOLAM HCL 2 MG/2ML IJ SOLN
INTRAMUSCULAR | Status: DC | PRN
  Administered 2024-01-15 (×5): 1 mg via INTRAVENOUS

## 2024-01-15 MED ORDER — OXYBUTYNIN CHLORIDE ER 10 MG PO TB24: 10.0000 mg | ORAL_TABLET | Freq: Every day | ORAL | Status: DC

## 2024-01-15 MED ORDER — MIDAZOLAM HCL 2 MG/2ML IJ SOLN
INTRAMUSCULAR | Status: AC
Start: 2024-01-15 — End: 2024-01-15
  Filled 2024-01-15: qty 2

## 2024-01-15 MED ORDER — TICAGRELOR 90 MG PO TABS: 90.0000 mg | ORAL_TABLET | Freq: Two times a day (BID) | ORAL | Status: DC

## 2024-01-15 MED ORDER — LIDOCAINE HCL (PF) 1 % IJ SOLN
INTRAMUSCULAR | Status: DC | PRN
  Administered 2024-01-15: 2 mL

## 2024-01-15 MED ORDER — IOHEXOL 350 MG/ML SOLN
INTRAVENOUS | Status: DC | PRN
  Administered 2024-01-15: 200 mL

## 2024-01-15 MED ORDER — HEPARIN (PORCINE) 25000 UT/250ML-% IV SOLN
INTRAVENOUS | Status: AC
  Filled 2024-01-15: qty 250

## 2024-01-15 MED ORDER — IRBESARTAN 150 MG PO TABS
150.0000 mg | ORAL_TABLET | Freq: Every day | ORAL | Status: DC
  Administered 2024-01-16 – 2024-01-17 (×2): 150 mg via ORAL
  Filled 2024-01-15 (×2): qty 1

## 2024-01-15 MED ORDER — MORPHINE SULFATE (PF) 2 MG/ML IV SOLN
INTRAVENOUS | Status: DC | PRN
  Administered 2024-01-15 (×2): 1 mg via INTRAVENOUS

## 2024-01-15 MED ORDER — CHLORHEXIDINE GLUCONATE CLOTH 2 % EX PADS
6.0000 | MEDICATED_PAD | Freq: Every day | CUTANEOUS | Status: DC
  Administered 2024-01-16 – 2024-01-17 (×2): 6 via TOPICAL

## 2024-01-15 MED ORDER — MORPHINE BOLUS VIA INFUSION: 1.0000 mg | INTRAVENOUS | Status: DC | PRN

## 2024-01-15 MED ORDER — ACETAMINOPHEN 325 MG PO TABS: 650.0000 mg | ORAL_TABLET | ORAL | Status: DC | PRN

## 2024-01-15 MED ORDER — VERAPAMIL HCL 2.5 MG/ML IV SOLN
INTRAVENOUS | Status: DC | PRN
Start: 2024-01-15 — End: 2024-01-15
  Administered 2024-01-15: 200 ug via INTRACORONARY

## 2024-01-15 MED ORDER — ROSUVASTATIN CALCIUM 20 MG PO TABS
20.0000 mg | ORAL_TABLET | Freq: Every day | ORAL | Status: DC
  Administered 2024-01-16 – 2024-01-17 (×2): 20 mg via ORAL
  Filled 2024-01-15 (×2): qty 1

## 2024-01-15 MED ORDER — HEPARIN SODIUM (PORCINE) 1000 UNIT/ML IJ SOLN
INTRAMUSCULAR | Status: AC
  Filled 2024-01-15: qty 10

## 2024-01-15 MED ORDER — DULOXETINE HCL 60 MG PO CPEP
60.0000 mg | ORAL_CAPSULE | Freq: Every day | ORAL | Status: DC
  Administered 2024-01-16 – 2024-01-17 (×2): 60 mg via ORAL
  Filled 2024-01-15 (×2): qty 1

## 2024-01-15 MED ORDER — HEPARIN (PORCINE) 25000 UT/250ML-% IV SOLN: 1000.0000 [IU]/h | INTRAVENOUS | Status: DC

## 2024-01-15 MED ORDER — TICAGRELOR 90 MG PO TABS
ORAL_TABLET | ORAL | Status: AC
  Filled 2024-01-15: qty 2

## 2024-01-15 MED ORDER — ASPIRIN 81 MG PO CHEW: 81.0000 mg | CHEWABLE_TABLET | Freq: Every day | ORAL | Status: DC

## 2024-01-15 MED ORDER — HYDRALAZINE HCL 20 MG/ML IJ SOLN
INTRAMUSCULAR | Status: DC | PRN
  Administered 2024-01-15 (×2): 10 mg via INTRAVENOUS

## 2024-01-15 MED ORDER — VERAPAMIL HCL 2.5 MG/ML IV SOLN
INTRAVENOUS | Status: DC | PRN
  Administered 2024-01-15: 10 mL via INTRA_ARTERIAL

## 2024-01-15 MED ORDER — METOPROLOL SUCCINATE ER 50 MG PO TB24: 50.0000 mg | ORAL_TABLET | Freq: Every day | ORAL | Status: DC

## 2024-01-15 MED ORDER — HEPARIN SODIUM (PORCINE) 1000 UNIT/ML IJ SOLN
INTRAMUSCULAR | Status: DC | PRN
  Administered 2024-01-15: 5000 [IU] via INTRA_ARTERIAL
  Administered 2024-01-15: 4000 [IU] via INTRAVENOUS

## 2024-01-15 MED ORDER — MORPHINE SULFATE (PF) 2 MG/ML IV SOLN
INTRAVENOUS | Status: AC
  Filled 2024-01-15: qty 1

## 2024-01-15 MED ORDER — NITROGLYCERIN 0.4 MG SL SUBL: 0.4000 mg | SUBLINGUAL_TABLET | SUBLINGUAL | Status: DC | PRN

## 2024-01-15 MED ORDER — HEPARIN SODIUM (PORCINE) 5000 UNIT/ML IJ SOLN
4000.0000 [IU] | Freq: Once | INTRAMUSCULAR | Status: AC
  Administered 2024-01-15: 4000 [IU] via INTRAVENOUS
  Filled 2024-01-15: qty 1

## 2024-01-15 MED ORDER — TIROFIBAN HCL IN NACL 5-0.9 MG/100ML-% IV SOLN
INTRAVENOUS | Status: AC
  Filled 2024-01-15: qty 100

## 2024-01-15 MED ORDER — DAPAGLIFLOZIN PROPANEDIOL 10 MG PO TABS
10.0000 mg | ORAL_TABLET | Freq: Every day | ORAL | Status: DC
  Administered 2024-01-16 – 2024-01-17 (×2): 10 mg via ORAL
  Filled 2024-01-15 (×2): qty 1

## 2024-01-15 MED ORDER — FENTANYL CITRATE (PF) 100 MCG/2ML IJ SOLN
INTRAMUSCULAR | Status: DC | PRN
  Administered 2024-01-15 (×5): 25 ug via INTRAVENOUS

## 2024-01-15 MED ORDER — ASPIRIN 325 MG PO TABS
325.0000 mg | ORAL_TABLET | Freq: Once | ORAL | Status: DC
  Filled 2024-01-15: qty 1

## 2024-01-15 MED ORDER — VERAPAMIL HCL 2.5 MG/ML IV SOLN
INTRAVENOUS | Status: AC
  Filled 2024-01-15: qty 2

## 2024-01-15 SURGICAL SUPPLY — 24 items
BALLOON EMERGE MR 3.0X12 (BALLOONS) IMPLANT
BALLOON SAPPHIRE NC24 3.0X12 (BALLOONS) IMPLANT
BALLOON SAPPHIRE NC24 4.0X12 (BALLOONS) IMPLANT
BALLOON ~~LOC~~ EMERGE MR 4.0X12 (BALLOONS) IMPLANT
BALLOON ~~LOC~~ EUPHORA RX 4.0X12 (BALLOONS) IMPLANT
CATH GUIDELINER COAST (CATHETERS) IMPLANT
CATH INFINITI 5 FR JL3.5 (CATHETERS) IMPLANT
CATH INFINITI 5FR ANG PIGTAIL (CATHETERS) IMPLANT
CATH LAUNCHER 6FR JR4 (CATHETERS) IMPLANT
CATH VISTA GUIDE 6FR JR4 ECOPK (CATHETERS) IMPLANT
DEVICE RAD COMP TR BAND LRG (VASCULAR PRODUCTS) IMPLANT
GLIDESHEATH SLEND SS 6F .021 (SHEATH) IMPLANT
GUIDEWIRE VAS SION BLUE 190 (WIRE) IMPLANT
KIT ENCORE 26 ADVANTAGE (KITS) IMPLANT
KIT HEMO VALVE WATCHDOG (MISCELLANEOUS) IMPLANT
KIT SYRINGE INJ CVI SPIKEX1 (MISCELLANEOUS) IMPLANT
PACK CARDIAC CATHETERIZATION (CUSTOM PROCEDURE TRAY) ×1 IMPLANT
SET ATX-X65L (MISCELLANEOUS) IMPLANT
STENT SYNERGY XD 3.0X16 (Permanent Stent) IMPLANT
STENT SYNERGY XD 4.0X16 (Permanent Stent) IMPLANT
STENT SYNERGY XD 4.0X28 (Permanent Stent) IMPLANT
WIRE ASAHI PROWATER 180CM (WIRE) IMPLANT
WIRE EMERALD 3MM-J .025X260CM (WIRE) IMPLANT
WIRE EMERALD 3MM-J .035X260CM (WIRE) IMPLANT

## 2024-01-15 NOTE — ED Provider Notes (Signed)
 [place] EMERGENCY DEPARTMENT AT [facility] Provider Note   CSN: [id number] Arrival date & time: 01/15/24  2043     Patient presents with: Chest Pain   Grant Williams. is a 68 y.o. male.   68 year old male presents for evaluation of chest pain.  States the pain started about 40 minutes ago radiates to his back and down his left arm.  Patient states this is a pressure/crushing chest pain feels like an elephant is sitting on my chest.  He also admits to lightheadedness shortness of breath nausea and sweating as well.  Has no history of coronary artery disease but he does have a history that runs in the family.  Also history of smoking, high cholesterol, hypertension and diabetes.   Chest Pain Associated symptoms: diaphoresis, nausea and shortness of breath   Associated symptoms: no abdominal pain, no back pain, no cough, no fever, no palpitations and no vomiting        Prior to Admission medications   Medication Sig Start Date End Date Taking? Authorizing Provider  acetaminophen  (TYLENOL ) 325 MG tablet Take 2 tablets (650 mg total) by mouth every 6 (six) hours as needed for mild pain or headache (fever >/= 101). 06/24/21   Pearlean Manus, MD  albuterol  (VENTOLIN  HFA) 108 (90 Base) MCG/ACT inhaler Inhale 2 puffs into the lungs every 6 (six) hours as needed for wheezing or shortness of breath. Patient not taking: Reported on 01/04/2023 06/24/21   Pearlean Manus, MD  amLODipine  (NORVASC ) 10 MG tablet Take 1 tablet (10 mg total) by mouth daily. Please contact the office to schedule appointment. 2ed attempt. 05/12/22   Court Dorn PARAS, MD  ARIPiprazole  (ABILIFY ) 5 MG tablet Take 5 mg by mouth daily.  02/11/14   [provider]  ascorbic acid  (VITAMIN C) 500 MG tablet Take 1 tablet (500 mg total) by mouth daily. 06/25/21   Pearlean Manus, MD  aspirin  EC 81 MG EC tablet Take 1 tablet (81 mg total) by mouth daily. Swallow whole. 01/09/21   Ghimire, Kuber, MD  Blood  Glucose Monitoring Suppl (ONETOUCH VERIO) w/Device KIT Use 1-4 times daily as needed/directed DX E11.9 Patient not taking: Reported on 01/04/2023 03/28/20   Kassie Mallick, MD  CALCIUM  CITRATE-VITAMIN D  PO Take 1 tablet by mouth 2 (two) times daily. 600 + vit d3    [provider]  cholecalciferol (VITAMIN D3) 25 MCG (1000 UT) tablet Take 1,000 Units by mouth 2 (two) times daily.    [provider]  Continuous Glucose Receiver (DEXCOM G7 RECEIVER) DEVI 1 Device by Does not apply route continuous. 08/31/23   Motwani, Komal, MD  Continuous Glucose Sensor (FREESTYLE LIBRE 3 PLUS SENSOR) MISC Change sensor every 15 days. 10/05/23   Motwani, Komal, MD  dapagliflozin  propanediol (FARXIGA ) 10 MG TABS tablet Take 1 tablet (10 mg total) by mouth daily. 07/09/23   Motwani, Komal, MD  dextromethorphan -guaiFENesin  (MUCINEX  DM) 30-600 MG 12hr tablet Take 1 tablet by mouth 2 (two) times daily. 06/24/21   Pearlean Manus, MD  DULoxetine  (CYMBALTA ) 30 MG capsule Take 60 mg by mouth daily. 02/21/14   [provider]  ergocalciferol  (VITAMIN D2) 50000 units capsule Take 50,000 Units by mouth 3 (three) times a week. Mon / wed /and Fri    [provider]  gabapentin  (NEURONTIN ) 800 MG tablet Take 800 mg by mouth 4 (four) times daily.     [provider]  guaiFENesin -dextromethorphan  (ROBITUSSIN DM) 100-10 MG/5ML syrup Take 10 mLs by mouth  every 4 (four) hours as needed for cough. Patient not taking: Reported on 01/04/2023 06/24/21   Pearlean Manus, MD  ipratropium (ATROVENT  HFA) 17 MCG/ACT inhaler Inhale 2 puffs into the lungs 2 (two) times daily. Patient not taking: Reported on 01/04/2023 06/24/21   Pearlean Manus, MD  iron polysaccharides (NIFEREX) 150 MG capsule Take 1 capsule by mouth 2 (two) times daily. Patient not taking: Reported on 01/04/2023 08/31/17   [provider]  Lancets [facility] ULTRASOFT) lancets Use 1-4 times daily as needed/directed  DX E11.9 Patient not  taking: Reported on 01/04/2023 03/28/20   Kassie Mallick, MD  metFORMIN  (GLUCOPHAGE -XR) 500 MG 24 hr tablet TAKE 4 TABLETS BY MOUTH EVERY DAY 09/09/23   Dartha Ernst, MD  metoprolol  (LOPRESSOR ) 100 MG tablet Take 50-100 mg by mouth See admin instructions. 100 mg in the morning, 50 mg at bedtime    [provider]  nortriptyline  (PAMELOR ) 10 MG capsule Take 3 capsules by mouth at bedtime. 11/14/20   [provider]  AISHA SINKS test strip Use 1-4 times daily as directed/needed   DX E11.9 03/28/20   Kassie Mallick, MD  oxybutynin  (DITROPAN -XL) 10 MG 24 hr tablet Take 10 mg by mouth at bedtime.    [provider]  Potassium Chloride  ER 20 MEQ TBCR Take 1 tablet by mouth daily. 05/06/21   [provider]  rOPINIRole  (REQUIP ) 1 MG tablet Take 1-2 mg by mouth at bedtime.  03/03/14   [provider]  rosuvastatin  (CRESTOR ) 10 MG tablet Take 10 mg by mouth 3 (three) times a week. M/ wed/ fri    [provider]  Semaglutide , 2 MG/DOSE, (OZEMPIC , 2 MG/DOSE,) 8 MG/3ML SOPN INJECT 2MG  SUBCUTANEOUSLY EVERY WEEK 12/27/23   Motwani, Komal, MD  traZODone  (DESYREL ) 50 MG tablet Take 50 mg by mouth at bedtime as needed for sleep.    [provider]  valsartan  (DIOVAN ) 160 MG tablet Take 1 tablet (160 mg total) by mouth daily. 06/24/21 06/24/22  Pearlean Manus, MD  zinc  sulfate 220 (50 Zn) MG capsule Take 1 capsule (220 mg total) by mouth daily. Patient not taking: Reported on 01/04/2023 06/25/21   Pearlean Manus, MD    Allergies: Other and Penicillins    Review of Systems  Constitutional:  Positive for diaphoresis. Negative for chills and fever.  HENT:  Negative for ear pain and sore throat.   Eyes:  Negative for pain and visual disturbance.  Respiratory:  Positive for chest tightness and shortness of breath. Negative for cough.   Cardiovascular:  Positive for chest pain. Negative for palpitations.  Gastrointestinal:  Positive for nausea. Negative for  abdominal pain and vomiting.  Genitourinary:  Negative for dysuria and hematuria.  Musculoskeletal:  Negative for arthralgias and back pain.  Skin:  Negative for color change and rash.  Neurological:  Negative for seizures and syncope.  All other systems reviewed and are negative.   Updated Vital Signs BP (!) 162/96   Pulse 63   Temp 97.6 F (36.4 C) (Axillary)   Resp (!) 25   Ht 5' 9 (1.753 m)   Wt 81.6 kg   SpO2 (!) 2%   BMI 26.58 kg/m   Physical Exam Vitals and nursing note reviewed.  Constitutional:      General: He is in acute distress.     Appearance: He is well-developed. He is ill-appearing.     Comments: Diaphoretic  HENT:     Head: Normocephalic and atraumatic.  Eyes:     Conjunctiva/sclera:  Conjunctivae normal.  Cardiovascular:     Rate and Rhythm: Regular rhythm. Bradycardia present.     Heart sounds: No murmur heard. Pulmonary:     Effort: Pulmonary effort is normal. Tachypnea present. No respiratory distress.     Breath sounds: Normal breath sounds.  Abdominal:     Palpations: Abdomen is soft.     Tenderness: There is no abdominal tenderness.  Musculoskeletal:        General: No swelling.     Cervical back: Neck supple.  Skin:    General: Skin is warm and dry.     Capillary Refill: Capillary refill takes less than 2 seconds.  Neurological:     Mental Status: He is alert.  Psychiatric:        Mood and Affect: Mood normal.     (all labs ordered are listed, but only abnormal results are displayed) Labs Reviewed  BASIC METABOLIC PANEL WITH GFR - Abnormal; Notable for the following components:      Result Value   Glucose, Bld 111 (*)    Anion gap 16 (*)    All other components within normal limits  CBC - Abnormal; Notable for the following components:   RBC 6.55 (*)    Hemoglobin 19.2 (*)    HCT 56.1 (*)    Platelets 147 (*)    All other components within normal limits  APTT  PROTIME-INR  HEPARIN  LEVEL (UNFRACTIONATED)  CBC  LIPID PANEL   BASIC METABOLIC PANEL WITH GFR  CBC  POCT ACTIVATED CLOTTING TIME  CG4 I-STAT (LACTIC ACID)  POCT ACTIVATED CLOTTING TIME  POCT ACTIVATED CLOTTING TIME  POCT ACTIVATED CLOTTING TIME  I-STAT CG4 LACTIC ACID, ED  I-STAT CG4 LACTIC ACID, ED  TROPONIN I (HIGH SENSITIVITY)  TROPONIN I (HIGH SENSITIVITY)    EKG: EKG Interpretation Date/Time:  Saturday January 15 2024 21:02:58 EDT Ventricular Rate:  66 PR Interval:  223 QRS Duration:  93 QT Interval:  416 QTC Calculation: 433 R Axis:   12  Text Interpretation: Sinus rhythm Prolonged PR interval Inferior infarct, acute (LCx) Lateral leads are also involved >>> Acute MI <<< Confirmed by Gennaro Bouchard ([id number]) on 01/15/2024 9:05:17 PM  Radiology: CARDIAC CATHETERIZATION Result Date: 01/15/2024   1st Diag lesion is 70% stenosed.   Prox LAD to Mid LAD lesion is 70% stenosed.   Ost LAD to Prox LAD lesion is 30% stenosed.   Dist RCA lesion is 80% stenosed.   Prox RCA lesion is 70% stenosed.   RPDA lesion is 100% stenosed.   RPAV lesion is 80% stenosed.   A stent was successfully placed.   A stent was successfully placed.   A stent was successfully placed.   Post intervention, there is a 0% residual stenosis.   Post intervention, there is a 0% residual stenosis.   Post intervention, there is a 0% residual stenosis. 1.  Thrombotic occlusion of distal right coronary artery treated with 2 drug-eluting stents this was complicated by distal embolization to the RPL V and PDA branches.  The RPL V thrombus occlusion was treated with 1 drug-eluting stent.  The PDA is a very small vessel and tortuous and no PCI was pursued. 2.  High-grade proximal right coronary artery lesion treated with 1 drug-eluting stent 3.  Mild to moderate left-sided disease. Recommendation: Dual antiplatelet therapy with aspirin  and ticagrelor  for preferably 1 year followed by indefinite ticagrelor  monotherapy.  Will continue Aggrastat  for 12 hours.  Pain control and blood pressure control  given distal  PDA embolization.  The results were reviewed with Dr. Gardenia.     Procedures   Medications Ordered in the ED  heparin  25000 UT/250ML infusion (has no administration in time range)  heparin  ADULT infusion 100 units/mL (25000 units/250mL) (has no administration in time range)  amLODipine  (NORVASC ) tablet 10 mg (has no administration in time range)  irbesartan  (AVAPRO ) tablet 150 mg (has no administration in time range)  ARIPiprazole  (ABILIFY ) tablet 5 mg (has no administration in time range)  DULoxetine  (CYMBALTA ) DR capsule 60 mg (has no administration in time range)  traZODone  (DESYREL ) tablet 50 mg (has no administration in time range)  dapagliflozin  propanediol (FARXIGA ) tablet 10 mg (has no administration in time range)  oxybutynin  (DITROPAN -XL) 24 hr tablet 10 mg (has no administration in time range)  aspirin  EC tablet 81 mg (has no administration in time range)  nitroGLYCERIN  (NITROSTAT ) SL tablet 0.4 mg (has no administration in time range)  acetaminophen  (TYLENOL ) tablet 650 mg (has no administration in time range)  ondansetron  (ZOFRAN ) injection 4 mg (has no administration in time range)  ticagrelor  (BRILINTA ) tablet 90 mg (has no administration in time range)  rosuvastatin  (CRESTOR ) tablet 20 mg (has no administration in time range)  morphine  bolus via infusion 1 mg (has no administration in time range)  carvedilol  (COREG ) tablet 12.5 mg (has no administration in time range)  nitroGLYCERIN  50 mg in dextrose  5 % 250 mL (0.2 mg/mL) infusion (has no administration in time range)  hydrALAZINE  (APRESOLINE ) injection 10 mg (has no administration in time range)  aspirin  chewable tablet 81 mg (has no administration in time range)  ticagrelor  (BRILINTA ) tablet 90 mg (has no administration in time range)  sodium chloride  0.9 % bolus 500 mL (500 mLs Intravenous New Bag/Given 01/15/24 2101)  heparin  injection 4,000 Units (4,000 Units Intravenous Given 01/15/24 2103)  aspirin   chewable tablet 324 mg (324 mg Oral Given 01/15/24 2105)  fentaNYL  (SUBLIMAZE ) injection 50 mcg (50 mcg Intravenous Given 01/15/24 2115)  tirofiban  (AGGRASTAT ) infusion 50 mcg/mL 100 mL (0.075 mcg/kg/min  81.6 kg Intravenous New Bag/Given 01/15/24 2227)                                    Medical Decision Making Patient arrived to the ER with complaints of fairly sudden onset chest pressure and bradycardia.  Patient appears uncomfortable is diaphoretic and clammy.  EKG concerning for inferior MI.  This was discussed with cardiology, Dr Wendel who accepted patient for transfer to go to the cath lab for STEMI. He was given aspirin  and fentanyl  and started on a heparin  drip.  Transport will be set up.  Patient and family at bedside agreeable with the plan.    Problems Addressed: ST elevation myocardial infarction (STEMI), unspecified artery [facility]): acute illness or injury that poses a threat to life or bodily functions  Amount and/or Complexity of Data Reviewed External Data Reviewed: notes.    Details: Outpatient records reviewed and patient followed up about 6 months ago with cardiology for hypertension Labs: ordered. Decision-making details documented in ED Course.    Details: Ordered and pending Radiology: ordered and independent interpretation performed. Decision-making details documented in ED Course.    Details: Ordered and pending ECG/medicine tests: ordered and independent interpretation performed. Decision-making details documented in ED Course.    Details: Ordered and interpreted by me and discussed with cardiology and patient has an inferior MI Discussion of management or test  interpretation with external provider(s): Cardiology - Dr. Wendel -I spoke with him regarding the patient he reviewed the EKG and accepted patient for transfer to go to the Cath Lab, recommended aspirin  and heparin   Risk OTC drugs. Prescription drug management. Parenteral controlled substances. Drug  therapy requiring intensive monitoring for toxicity. Decision regarding hospitalization. Emergency major surgery. Risk Details: CRITICAL CARE Performed by: Duwaine LITTIE Fusi   Total critical care time: 30 minutes  Critical care time was exclusive of separately billable procedures and treating other patients.  Critical care was necessary to treat or prevent imminent or life-threatening deterioration.  Critical care was time spent personally by me on the following activities: development of treatment plan with patient and/or surrogate as well as nursing, discussions with consultants, evaluation of patient's response to treatment, examination of patient, obtaining history from patient or surrogate, ordering and performing treatments and interventions, ordering and review of laboratory studies, ordering and review of radiographic studies, pulse oximetry and re-evaluation of patient's condition.   Critical Care Total time providing critical care: 30 minutes     Final diagnoses:  ST elevation myocardial infarction (STEMI), unspecified artery [facility])    ED Discharge Orders          Ordered    AMB Referral to Cardiac Rehabilitation - Phase II        01/15/24 2340               Emitt Maglione L, DO 01/15/24 2340

## 2024-01-15 NOTE — H&P (Signed)
 Cardiology Admission History and Physical   Patient ID: Grant Williams. MRN: [id number]; DOB: 1955/11/11   Admission date: 01/15/2024  PCP:  Arby Lyle LABOR, NP   [facility] Providers Cardiologist:  Dorn Lesches, MD       Chief Complaint:  chest pain  Patient Profile: Grant Lewter. is a 68 y.o. male with tobacco use, DM, HTN who is being seen 01/15/2024 for the evaluation of STEMI.  History of Present Illness: Grant Williams developed acute severe chest pain starting around 8pm tonight, some relief w/ nitroglycerin , radiated down his L arm, associated w/ diaphoresis, SOB, and nausea.  No personal CAD but some family history He smokes cigars, and takes meds for HTN and DM No recent bleeding overtly No difficulty affording medications. Has sleep apnea but doesn't use CPAP.  Received ASA and heparin  bolus while at [facility]. Currently in [facility] cath lab w/ 5/10 chest discomfort and some diaphoresis.  ECG with inferior ST elevation w/ lateral involvement as well. Early labs notable for Cr of 1.2 and platelet count slightly low at 147     Past Medical History:  Diagnosis Date   Anemia    iron infusions   Coronary artery disease    Cough syncope    Diabetes mellitus without complication (HCC)    Fibromyalgia    GERD (gastroesophageal reflux disease)    History of kidney stones    HTN (hypertension)    Hyperlipidemia    Hypertension    Morbid obesity (HCC)    Pneumonia    Sleep apnea    dont tolerate cpap   SOB (shortness of breath)    Uncontrolled type 2 diabetes mellitus with complication    Vitamin B12 deficiency    Past Surgical History:  Procedure Laterality Date   ELBOW SURGERY Right    HERNIA REPAIR     left and right inguinal , umbilical also   ROUX-EN-Y GASTRIC BYPASS     08-2016  something nicked and pt. had to have 3 units of blood   SHOULDER ARTHROSCOPY Right    TRANSURETHRAL RESECTION OF PROSTATE N/A 03/14/2019   Procedure:  TRANSURETHRAL RESECTION OF THE PROSTATE (TURP);  Surgeon: Watt Rush, MD;  Location: [facility];  Service: Urology;  Laterality: N/A;     Medications Prior to Admission: Prior to Admission medications   Medication Sig Start Date End Date Taking? Authorizing Provider  acetaminophen  (TYLENOL ) 325 MG tablet Take 2 tablets (650 mg total) by mouth every 6 (six) hours as needed for mild pain or headache (fever >/= 101). 06/24/21   Pearlean Manus, MD  albuterol  (VENTOLIN  HFA) 108 (90 Base) MCG/ACT inhaler Inhale 2 puffs into the lungs every 6 (six) hours as needed for wheezing or shortness of breath. Patient not taking: Reported on 01/04/2023 06/24/21   Pearlean Manus, MD  amLODipine  (NORVASC ) 10 MG tablet Take 1 tablet (10 mg total) by mouth daily. Please contact the office to schedule appointment. 2ed attempt. 05/12/22   Lesches Dorn PARAS, MD  ARIPiprazole  (ABILIFY ) 5 MG tablet Take 5 mg by mouth daily.  02/11/14   [provider]  ascorbic acid  (VITAMIN C) 500 MG tablet Take 1 tablet (500 mg total) by mouth daily. 06/25/21   Pearlean Manus, MD  aspirin  EC 81 MG EC tablet Take 1 tablet (81 mg total) by mouth daily. Swallow whole. 01/09/21   Ghimire, Kuber, MD  Blood Glucose Monitoring Suppl (ONETOUCH VERIO) w/Device KIT Use 1-4 times daily as needed/directed DX  E11.9 Patient not taking: Reported on 01/04/2023 03/28/20   Kassie Mallick, MD  CALCIUM  CITRATE-VITAMIN D  PO Take 1 tablet by mouth 2 (two) times daily. 600 + vit d3    [provider]  cholecalciferol (VITAMIN D3) 25 MCG (1000 UT) tablet Take 1,000 Units by mouth 2 (two) times daily.    [provider]  Continuous Glucose Receiver (DEXCOM G7 RECEIVER) DEVI 1 Device by Does not apply route continuous. 08/31/23   Motwani, Komal, MD  Continuous Glucose Sensor (FREESTYLE LIBRE 3 PLUS SENSOR) MISC Change sensor every 15 days. 10/05/23   Motwani, Komal, MD  dapagliflozin  propanediol (FARXIGA ) 10 MG TABS tablet Take 1 tablet (10 mg  total) by mouth daily. 07/09/23   Motwani, Komal, MD  dextromethorphan -guaiFENesin  (MUCINEX  DM) 30-600 MG 12hr tablet Take 1 tablet by mouth 2 (two) times daily. 06/24/21   Pearlean Manus, MD  DULoxetine  (CYMBALTA ) 30 MG capsule Take 60 mg by mouth daily. 02/21/14   [provider]  ergocalciferol  (VITAMIN D2) 50000 units capsule Take 50,000 Units by mouth 3 (three) times a week. Mon / wed /and Fri    [provider]  gabapentin  (NEURONTIN ) 800 MG tablet Take 800 mg by mouth 4 (four) times daily.     [provider]  guaiFENesin -dextromethorphan  (ROBITUSSIN DM) 100-10 MG/5ML syrup Take 10 mLs by mouth every 4 (four) hours as needed for cough. Patient not taking: Reported on 01/04/2023 06/24/21   Pearlean Manus, MD  ipratropium (ATROVENT  HFA) 17 MCG/ACT inhaler Inhale 2 puffs into the lungs 2 (two) times daily. Patient not taking: Reported on 01/04/2023 06/24/21   Pearlean Manus, MD  iron polysaccharides (NIFEREX) 150 MG capsule Take 1 capsule by mouth 2 (two) times daily. Patient not taking: Reported on 01/04/2023 08/31/17   [provider]  Lancets [facility] ULTRASOFT) lancets Use 1-4 times daily as needed/directed  DX E11.9 Patient not taking: Reported on 01/04/2023 03/28/20   Kassie Mallick, MD  metFORMIN  (GLUCOPHAGE -XR) 500 MG 24 hr tablet TAKE 4 TABLETS BY MOUTH EVERY DAY 09/09/23   Dartha Ernst, MD  metoprolol  (LOPRESSOR ) 100 MG tablet Take 50-100 mg by mouth See admin instructions. 100 mg in the morning, 50 mg at bedtime    [provider]  nortriptyline  (PAMELOR ) 10 MG capsule Take 3 capsules by mouth at bedtime. 11/14/20   [provider]  AISHA SINKS test strip Use 1-4 times daily as directed/needed   DX E11.9 03/28/20   Kassie Mallick, MD  oxybutynin  (DITROPAN -XL) 10 MG 24 hr tablet Take 10 mg by mouth at bedtime.    [provider]  Potassium Chloride  ER 20 MEQ TBCR Take 1 tablet by mouth daily. 05/06/21   [provider]   rOPINIRole  (REQUIP ) 1 MG tablet Take 1-2 mg by mouth at bedtime.  03/03/14   [provider]  rosuvastatin  (CRESTOR ) 10 MG tablet Take 10 mg by mouth 3 (three) times a week. M/ wed/ fri    [provider]  Semaglutide , 2 MG/DOSE, (OZEMPIC , 2 MG/DOSE,) 8 MG/3ML SOPN INJECT 2MG  SUBCUTANEOUSLY EVERY WEEK 12/27/23   Motwani, Komal, MD  traZODone  (DESYREL ) 50 MG tablet Take 50 mg by mouth at bedtime as needed for sleep.    [provider]  valsartan  (DIOVAN ) 160 MG tablet Take 1 tablet (160 mg total) by mouth daily. 06/24/21 06/24/22  Pearlean Manus, MD  zinc  sulfate 220 (50 Zn) MG capsule Take 1 capsule (220 mg total) by mouth daily. Patient not taking: Reported on 01/04/2023 06/25/21  Pearlean Manus, MD     Allergies:    Allergies  Allergen Reactions   Other     Steroids   Does not tolerate bad for stomach has to take med before and after if uses any steroid    Penicillins Other (See Comments)    Unknown childhood allergic reaction - no other information available    Social History:   Social History   Socioeconomic History   Marital status: Married    Spouse name: Not on file   Number of children: Not on file   Years of education: Not on file   Highest education level: Not on file  Occupational History   Not on file  Tobacco Use   Smoking status: Former   Smokeless tobacco: Former    Types: Chew   Tobacco comments:    30 years ago  Psychologist, educational Use   Vaping status: Never Used  Substance and Sexual Activity   Alcohol use: No   Drug use: No   Sexual activity: Not Currently  Other Topics Concern   Not on file  Social History Narrative   Not on file   Social Drivers of Health   Financial Resource Strain: Low Risk  (08/13/2023)   Received from [facility]   Overall Financial Resource Strain (CARDIA)    Difficulty of Paying Living Expenses: Not hard at all  Food Insecurity: No Food Insecurity (08/13/2023)   Received from [facility]   Hunger Vital  Sign    Within the past 12 months, you worried that your food would run out before you got the money to buy more.: Never true    Within the past 12 months, the food you bought just didn't last and you didn't have money to get more.: Never true  Transportation Needs: No Transportation Needs (08/13/2023)   Received from [facility] - Transportation    Lack of Transportation (Medical): No    Lack of Transportation (Non-Medical): No  Physical Activity: Sufficiently Active (08/13/2023)   Received from [facility]   Exercise Vital Sign    On average, how many days per week do you engage in moderate to strenuous exercise (like a brisk walk)?: 7 days    On average, how many minutes do you engage in exercise at this level?: 60 min  Stress: No Stress Concern Present (08/13/2023)   Received from [facility] - Occupational Stress Questionnaire    Feeling of Stress : Not at all  Social Connections: Socially Integrated (08/13/2023)   Received from [facility]   Social Network    How would you rate your social network (family, work, friends)?: Good participation with social networks  Intimate Partner Violence: Not At Risk (08/13/2023)   Received from [facility]   HITS    Over the last 12 months how often did your partner physically hurt you?: Never    Over the last 12 months how often did your partner insult you or talk down to you?: Never    Over the last 12 months how often did your partner threaten you with physical harm?: Never    Over the last 12 months how often did your partner scream or curse at you?: Never     Family History:   The patient's family history includes Diabetes in his father.    ROS:  Please see the history of present illness.  All other ROS reviewed and negative.     Physical  Exam/Data: Vitals:   01/15/24 2057 01/15/24 2059 01/15/24 2100 01/15/24 2152  BP:   (!) 162/96   Pulse: 60  63   Resp: 16  (!) 25    Temp:  97.6 F (36.4 C)    TempSrc:  Axillary    SpO2: 100%  100% (!) 2%  Weight:    81.6 kg  Height:    5' 9 (1.753 m)   No intake or output data in the 24 hours ending 01/15/24 2227    01/15/2024    9:52 PM 01/15/2024    8:52 PM 01/04/2023    8:10 AM  Last 3 Weights  Weight (lbs) 180 lb 180 lb 197 lb  Weight (kg) 81.647 kg 81.647 kg 89.359 kg     Body mass index is 26.58 kg/m.  General:  Well nourished, well developed, in moderate acute distress HEENT: normal Neck: no JVD Vascular: Distal pulses 2+ bilaterally   Cardiac:  normal S1, S2; RRR; no murmur  Lungs:  clear to auscultation bilaterally, no wheezing, rhonchi or rales  Abd: soft, nontender, no hepatomegaly  Ext: no edema Skin: warm and dry  Psych:  Normal affect   EKG:  The ECG that was done earlier at [facility] was personally reviewed and demonstrates inferior STEMI  Relevant CV Studies: Last echo 2022 showed normal EF without regional WMA, some diastolic dysfunction, and no significant valvular disease  Laboratory Data: High Sensitivity Troponin:   Recent Labs  Lab 01/15/24 2053  TROPONINIHS 17      Chemistry Recent Labs  Lab 01/15/24 2053  NA 140  K 3.6  CL 101  CO2 23  GLUCOSE 111*  BUN 15  CREATININE 1.22  CALCIUM  10.0  GFRNONAA >60  ANIONGAP 16*    Last LDL 3 years ago well controlled  Hematology Recent Labs  Lab 01/15/24 2053  WBC 9.1  RBC 6.55*  HGB 19.2*  HCT 56.1*  MCV 85.6  MCH 29.3  MCHC 34.2  RDW 14.9  PLT 147*   Last 1c 6.2 in Feb this year. Assessment and Plan: Inferior STEMI s/p 2 DES to the mid RCA then a third in the distal RPL, and a 4th to the proximal RCA, PCI complicated by recurrent slow flow/no reflow with persistent chest pain throughout the procedure, treated with ticagrelor  load and tirofiban  for distal thromboembolization.  PDA stumped off very distally (too small for intervention), otherwise TIMI 3 flow through RCA.  Quite hypertensive during procedure,  requiring hydralazine  boluses.  Continue DAPT with tirofiban  infusion per cath lab orders.  High intensity statin, continue ARB, BB (changed to carvedilol  for more optimal BP control), and amlodipine  for secondary prevention and BP control. Somewhat hypertensive in the lab. Hydralazine  prn SBP>160  Nitrodrip and morphine  as needed to manage ongoing chest pain.  May need a nicotine patch and would benefit from smoking cessation counseling  Watch platelet count after higher potency P2Y12 and GPI Metformin  and semaglutide  held in setting of mild AKI and contrast exposure.   Risk Assessment/Risk Scores:   TIMI Risk Score for ST  Elevation MI:   The patient's TIMI risk score is 4, which indicates a 7.3% risk of all cause mortality at 30 days.      Code Status: Full Code  Severity of Illness: The appropriate patient status for this patient is INPATIENT. Inpatient status is judged to be reasonable and necessary in order to provide the required intensity of service to ensure the patient's safety. The patient's presenting symptoms, physical  exam findings, and initial radiographic and laboratory data in the context of their chronic comorbidities is felt to place them at high risk for further clinical deterioration. Furthermore, it is not anticipated that the patient will be medically stable for discharge from the hospital within 2 midnights of admission.   * I certify that at the point of admission it is my clinical judgment that the patient will require inpatient hospital care spanning beyond 2 midnights from the point of admission due to high intensity of service, high risk for further deterioration and high frequency of surveillance required.*  For questions or updates, please contact [facility] Please consult [email or web address] for contact info under     Signed, Andee Flatten, MD  01/15/2024 10:27 PM

## 2024-01-15 NOTE — ED Notes (Signed)
Pt placed on 2lpm NC for comfort

## 2024-01-15 NOTE — ED Notes (Signed)
 Pt off unit to [facility]

## 2024-01-15 NOTE — Consult Note (Signed)
 PHARMACY - ANTICOAGULATION CONSULT NOTE  Pharmacy Consult for Heparin  Indication: chest pain/ACS  Allergies  Allergen Reactions   Other     Steroids   Does not tolerate bad for stomach has to take med before and after if uses any steroid    Penicillins Other (See Comments)    Unknown childhood allergic reaction - no other information available   Patient Measurements: Height: 5' 9 (175.3 cm) Weight: 81.6 kg (180 lb) IBW/kg (Calculated) : 70.7 HEPARIN  DW (KG): 81.6  Vital Signs: Temp: 97.6 F (36.4 C) (07/26 2059) Temp Source: Axillary (07/26 2059) BP: 175/84 (07/26 2052) Pulse Rate: 60 (07/26 2057)  Labs: No results for input(s): HGB, HCT, PLT, APTT, LABPROT, INR, HEPARINUNFRC, HEPRLOWMOCWT, CREATININE, CKTOTAL, CKMB, TROPONINIHS in the last 72 hours.  CrCl cannot be calculated (Patient's most recent lab result is older than the maximum 21 days allowed.).  Medical History: Past Medical History:  Diagnosis Date   Anemia    iron infusions   Coronary artery disease    Cough syncope    Diabetes mellitus without complication (HCC)    Fibromyalgia    GERD (gastroesophageal reflux disease)    History of kidney stones    HTN (hypertension)    Hyperlipidemia    Hypertension    Morbid obesity (HCC)    Pneumonia    Sleep apnea    dont tolerate cpap   SOB (shortness of breath)    Uncontrolled type 2 diabetes mellitus with complication    Vitamin B12 deficiency    Medications:  No anticoagulation PTA noted from chart review   Assessment: 68 year old male that presented with chest tightness. From chart review, patient was not on any anticoagulation prior to admission. Pharmacy has been consulted for initiation and management of a heparin  infusion.   Baseline labs: Hgb 19.2  PLT 147  aPTT 33  INR 1.0  Goal of Therapy:  Heparin  level 0.3-0.7 units/ml Monitor platelets by anticoagulation protocol: Yes   Plan:  4000 unit bolus x 1 dose  ordered by MD   Start heparin  infusion at 1000 units/hr Check HL 6 hours after start of infusion  Monitor CBC daily and for s/sx of bleeding   Evonnie Nieves, PharmD Clinical Pharmacist  01/15/2024 9:00 PM

## 2024-01-15 NOTE — ED Triage Notes (Signed)
 Pt stated that he began having severe chest tightness nearly an hour ago. Pt also complaining of left shoulder and arm pain as well. Pt did take one nitroglycerin  before arrival

## 2024-01-16 ENCOUNTER — Inpatient Hospital Stay (HOSPITAL_COMMUNITY)

## 2024-01-16 ENCOUNTER — Encounter (HOSPITAL_COMMUNITY): Payer: Self-pay | Admitting: Internal Medicine

## 2024-01-16 DIAGNOSIS — I2119 ST elevation (STEMI) myocardial infarction involving other coronary artery of inferior wall: Secondary | ICD-10-CM | POA: Diagnosis not present

## 2024-01-16 LAB — BASIC METABOLIC PANEL WITH GFR
Anion gap: 13 (ref 5–15)
Anion gap: 13 (ref 5–15)
BUN: 12 mg/dL (ref 8–23)
BUN: 11 mg/dL (ref 8–23)
CO2: 19 mmol/L — ABNORMAL LOW (ref 22–32)
CO2: 19 mmol/L — ABNORMAL LOW (ref 22–32)
Calcium: 8.9 mg/dL (ref 8.9–10.3)
Calcium: 9.1 mg/dL (ref 8.9–10.3)
Chloride: 106 mmol/L (ref 98–111)
Chloride: 106 mmol/L (ref 98–111)
Creatinine, Ser: 0.75 mg/dL (ref 0.61–1.24)
Creatinine, Ser: 0.81 mg/dL (ref 0.61–1.24)
GFR, Estimated: >60 mL/min (ref >60)
GFR, Estimated: >60 mL/min (ref >60)
Glucose, Bld: 171 mg/dL — ABNORMAL HIGH (ref 70–99)
Glucose, Bld: 181 mg/dL — ABNORMAL HIGH (ref 70–99)
Potassium: 3.1 mmol/L — ABNORMAL LOW (ref 3.5–5.1)
Potassium: 3.8 mmol/L (ref 3.5–5.1)
Sodium: 138 mmol/L (ref 135–145)
Sodium: 138 mmol/L (ref 135–145)

## 2024-01-16 LAB — CBC
HCT: 55.2 % — ABNORMAL HIGH (ref 39.0–52.0)
Hemoglobin: 19.2 g/dL — ABNORMAL HIGH (ref 13.0–17.0)
MCH: 29.4 pg (ref 26.0–34.0)
MCHC: 34.8 g/dL (ref 30.0–36.0)
MCV: 84.4 fL (ref 80.0–100.0)
Platelets: 123 K/uL — ABNORMAL LOW (ref 150–400)
RBC: 6.54 MIL/uL — ABNORMAL HIGH (ref 4.22–5.81)
RDW: 14.7 % (ref 11.5–15.5)
WBC: 9.9 K/uL (ref 4.0–10.5)
nRBC: 0 % (ref 0.0–0.2)

## 2024-01-16 LAB — LIPID PANEL
Cholesterol: 133 mg/dL (ref 0–200)
HDL: 56 mg/dL (ref >40)
LDL Cholesterol: 66 mg/dL (ref 0–99)
Total CHOL/HDL Ratio: 2.4 ratio
Triglycerides: 54 mg/dL (ref <150)
VLDL: 11 mg/dL (ref 0–40)

## 2024-01-16 LAB — ECHOCARDIOGRAM COMPLETE
Area-P 1/2: 3.17 cm2
Est EF: 55
Height: 69 in
S' Lateral: 2.4 cm
Single Plane A2C EF: 46.1 %
Weight: 2880 [oz_av]

## 2024-01-16 LAB — GLUCOSE, CAPILLARY
Glucose-Capillary: 159 mg/dL — ABNORMAL HIGH (ref 70–99)
Glucose-Capillary: 203 mg/dL — ABNORMAL HIGH (ref 70–99)
Glucose-Capillary: 148 mg/dL — ABNORMAL HIGH (ref 70–99)
Glucose-Capillary: 191 mg/dL — ABNORMAL HIGH (ref 70–99)

## 2024-01-16 LAB — LACTIC ACID, PLASMA: Lactic Acid, Venous: 1.2 mmol/L (ref 0.5–1.9)

## 2024-01-16 LAB — TROPONIN I (HIGH SENSITIVITY): Troponin I (High Sensitivity): 2500 ng/L (ref <18)

## 2024-01-16 LAB — HEMOGLOBIN A1C
Hgb A1c MFr Bld: 5.4 % (ref 4.8–5.6)
Mean Plasma Glucose: 108.28 mg/dL

## 2024-01-16 LAB — MRSA NEXT GEN BY PCR, NASAL: MRSA by PCR Next Gen: NOT DETECTED

## 2024-01-16 MED ORDER — INSULIN ASPART 100 UNIT/ML IJ SOLN: 0.0000 [IU] | Freq: Every day | INTRAMUSCULAR | Status: DC

## 2024-01-16 MED ORDER — SODIUM CHLORIDE 0.9% FLUSH
3.0000 mL | INTRAVENOUS | Status: DC | PRN
Start: 2024-01-16 — End: 2024-01-17

## 2024-01-16 MED ORDER — MORPHINE SULFATE (PF) 2 MG/ML IV SOLN
1.0000 mg | INTRAVENOUS | Status: DC | PRN
  Administered 2024-01-16 (×2): 1 mg via INTRAVENOUS
  Filled 2024-01-16 (×2): qty 1

## 2024-01-16 MED ORDER — SODIUM CHLORIDE 0.9 % IV SOLN: 250.0000 mL | INTRAVENOUS | Status: AC | PRN

## 2024-01-16 MED ORDER — HYDRALAZINE HCL 20 MG/ML IJ SOLN
10.0000 mg | INTRAMUSCULAR | Status: AC | PRN
Start: 2024-01-16 — End: 2024-01-16

## 2024-01-16 MED ORDER — CARVEDILOL 6.25 MG PO TABS
6.2500 mg | ORAL_TABLET | Freq: Two times a day (BID) | ORAL | Status: DC
  Administered 2024-01-16 – 2024-01-17 (×2): 6.25 mg via ORAL
  Filled 2024-01-16 (×2): qty 1

## 2024-01-16 MED ORDER — POTASSIUM CHLORIDE CRYS ER 20 MEQ PO TBCR
40.0000 meq | EXTENDED_RELEASE_TABLET | Freq: Once | ORAL | Status: AC
  Administered 2024-01-16: 40 meq via ORAL
  Filled 2024-01-16: qty 2

## 2024-01-16 MED ORDER — SODIUM CHLORIDE 0.9% FLUSH
3.0000 mL | Freq: Two times a day (BID) | INTRAVENOUS | Status: DC
  Administered 2024-01-16 – 2024-01-17 (×4): 3 mL via INTRAVENOUS

## 2024-01-16 MED ORDER — ROPINIROLE HCL 1 MG PO TABS
2.0000 mg | ORAL_TABLET | Freq: Every day | ORAL | Status: DC
  Administered 2024-01-16: 2 mg via ORAL
  Filled 2024-01-16: qty 2

## 2024-01-16 MED ORDER — INSULIN ASPART 100 UNIT/ML IJ SOLN
0.0000 [IU] | Freq: Three times a day (TID) | INTRAMUSCULAR | Status: DC
  Administered 2024-01-16: 1 [IU] via SUBCUTANEOUS

## 2024-01-16 MED ORDER — ATORVASTATIN CALCIUM 80 MG PO TABS: 80.0000 mg | ORAL_TABLET | Freq: Every day | ORAL | Status: DC

## 2024-01-16 MED ORDER — POTASSIUM CHLORIDE CRYS ER 20 MEQ PO TBCR
20.0000 meq | EXTENDED_RELEASE_TABLET | Freq: Once | ORAL | Status: AC
  Administered 2024-01-16: 20 meq via ORAL
  Filled 2024-01-16: qty 1

## 2024-01-16 MED ORDER — LABETALOL HCL 5 MG/ML IV SOLN: 10.0000 mg | INTRAVENOUS | Status: AC | PRN

## 2024-01-16 MED ORDER — SODIUM CHLORIDE 0.9 % IV SOLN: INTRAVENOUS | Status: AC

## 2024-01-16 MED ORDER — TIROFIBAN HCL IN NACL 5-0.9 MG/100ML-% IV SOLN
0.1500 ug/kg/min | INTRAVENOUS | Status: AC
  Administered 2024-01-16 (×3): 0.15 ug/kg/min via INTRAVENOUS
  Filled 2024-01-16 (×2): qty 100

## 2024-01-16 MED ORDER — GABAPENTIN 400 MG PO CAPS
400.0000 mg | ORAL_CAPSULE | Freq: Four times a day (QID) | ORAL | Status: DC
  Administered 2024-01-16 – 2024-01-17 (×5): 400 mg via ORAL
  Filled 2024-01-16 (×5): qty 1

## 2024-01-16 NOTE — Progress Notes (Signed)
  Echocardiogram 2D Echocardiogram has been performed.  Grant Williams 01/16/2024, 5:59 PM

## 2024-01-16 NOTE — Plan of Care (Signed)
   Problem: Education: Goal: Knowledge of General Education information will improve Description: Including pain rating scale, medication(s)/side effects and non-pharmacologic comfort measures Outcome: Progressing   Problem: Activity: Goal: Risk for activity intolerance will decrease Outcome: Progressing   Problem: Nutrition: Goal: Adequate nutrition will be maintained Outcome: Progressing

## 2024-01-16 NOTE — Plan of Care (Signed)

## 2024-01-16 NOTE — Plan of Care (Signed)

## 2024-01-16 NOTE — Progress Notes (Signed)
 Cardiology Progress Note   Patient ID: Grant Williams. MRN: [id number]; DOB: August 04, 1955   Admission date: 01/15/2024  PCP:  Arby Lyle LABOR, NP   [facility] Providers Cardiologist:  Dorn Lesches, MD       Chief Complaint:  STEMI  Patient Profile: Grant Williams. is a 68 y.o. male with tobacco use, DM, HTN who is s/p PCI to the proximal RCA & distal RCA. Patient presented on 01/15/24 with severe chest pain with ECG notable for inferior ST elevation.    Physical Exam/Data: Vitals:   01/16/24 0630 01/16/24 0645 01/16/24 0700 01/16/24 0950  BP: (!) 145/92 (!) 148/94  (!) 142/87  Pulse: (!) 58 (!) 57  (!) 59  Resp: 19 (!) 22    Temp:   97.6 F (36.4 C)   TempSrc:   Axillary   SpO2: 95% 95%    Weight:      Height:        Intake/Output Summary (Last 24 hours) at 01/16/2024 1120 Last data filed at 01/16/2024 1020 Gross per 24 hour  Intake 389.49 ml  Output 1775 ml  Net -1385.51 ml      01/15/2024    9:52 PM 01/15/2024    8:52 PM 01/04/2023    8:10 AM  Last 3 Weights  Weight (lbs) 180 lb 180 lb 197 lb  Weight (kg) 81.647 kg 81.647 kg 89.359 kg     Lungs- normal work of breathing CARDIAC:  JVP: 5 cm          Normal rate with regular rhythm. no murmur.  Pulses 2+. no edema.  ABDOMEN: Soft, non-tender, non-distended.  EXTREMITIES: Warm and well perfused. Right radial access site with normal pulse; no hematoma.  NEUROLOGIC: No obvious FND   EKG:  The ECG that was done earlier at [facility] was personally reviewed and demonstrates inferior STEMI  Relevant CV Studies: Last echo 2022 showed normal EF without regional WMA, some diastolic dysfunction, and no significant valvular disease  Laboratory Data: High Sensitivity Troponin:   Recent Labs  Lab 01/15/24 2053 01/16/24 0223  TROPONINIHS 17 2,500*      Chemistry Recent Labs  Lab 01/15/24 2053 01/16/24 0223  NA 140 138  K 3.6 3.1*  CL 101 106  CO2 23 19*  GLUCOSE 111* 171*  BUN 15 12   CREATININE 1.22 0.75  CALCIUM  10.0 8.9  GFRNONAA >60 >60  ANIONGAP 16* 13    Last LDL 3 years ago well controlled  Hematology Recent Labs  Lab 01/15/24 2053 01/16/24 0223  WBC 9.1 9.9  RBC 6.55* 6.54*  HGB 19.2* 19.2*  HCT 56.1* 55.2*  MCV 85.6 84.4  MCH 29.3 29.4  MCHC 34.2 34.8  RDW 14.9 14.7  PLT 147* 123*   Last 1c 6.2 in Feb this year. Assessment and Plan: Inferior STEMI s/p 2 DES to the mid RCA then a third in the distal RPL, and a 4th to the proximal RCA, PCI complicated by recurrent slow flow/no reflow with persistent chest pain throughout the procedure, treated with ticagrelor  load and tirofiban  for distal thromboembolization.  PDA stumped off very distally (too small for intervention), otherwise TIMI 3 flow through RCA.  Quite hypertensive during procedure, requiring hydralazine  boluses.  Inferior STEMI - s/p DES x 2 to the mid RCA, x1 to the mid RCA and x1 to the proximal RCA with PCI complicated by recurrent slow flow/no reflow treated with brilinta  load and Aggrastat  for distal thromboembolization.  - PDA stumped  off distally.  - Continue DAPT with brilinta /ASA - Repeat TTE pending - Patient currently on Aggrastat ; chest pain has improved significantly now. Continues to have some ectopy in telemetry.  - Bradycardic today; decrease coreg  to 6.25mg  BID.  - Will monitor in 2H today; plan for transfer to floor or D/C home tomorrow AM.  - Team C to resume care tomorrow.  Hyperlipidemia - Lipid panel pending; crestor  20mg  daily  Hypertension - Restarted home meds: amlodipine  10mg  daily, coreg  6.25mg  BID, irbesartan  150mg  daily.  Tobacco abuse Hypokalemia - K 3.1. Repleting 5. Restless legs / insomnia  - Discussed with pharmD; starting home home meds.  6. T2DM  - Holding metformin   - Farxiga  restarted  - SSI.    Grant Walla, DO  01/16/2024 11:20 AM

## 2024-01-17 ENCOUNTER — Other Ambulatory Visit (HOSPITAL_COMMUNITY): Payer: Self-pay

## 2024-01-17 ENCOUNTER — Telehealth (HOSPITAL_COMMUNITY): Payer: Self-pay | Admitting: Pharmacy Technician

## 2024-01-17 LAB — BASIC METABOLIC PANEL WITH GFR
Anion gap: 12 (ref 5–15)
BUN: 13 mg/dL (ref 8–23)
CO2: 21 mmol/L — ABNORMAL LOW (ref 22–32)
Calcium: 9 mg/dL (ref 8.9–10.3)
Chloride: 105 mmol/L (ref 98–111)
Creatinine, Ser: 0.86 mg/dL (ref 0.61–1.24)
GFR, Estimated: >60 mL/min (ref >60)
Glucose, Bld: 178 mg/dL — ABNORMAL HIGH (ref 70–99)
Potassium: 3.7 mmol/L (ref 3.5–5.1)
Sodium: 138 mmol/L (ref 135–145)

## 2024-01-17 LAB — GLUCOSE, CAPILLARY
Glucose-Capillary: 149 mg/dL — ABNORMAL HIGH (ref 70–99)
Glucose-Capillary: 164 mg/dL — ABNORMAL HIGH (ref 70–99)

## 2024-01-17 LAB — CBC
HCT: 56.8 % — ABNORMAL HIGH (ref 39.0–52.0)
Hemoglobin: 19 g/dL — ABNORMAL HIGH (ref 13.0–17.0)
MCH: 28.7 pg (ref 26.0–34.0)
MCHC: 33.5 g/dL (ref 30.0–36.0)
MCV: 85.9 fL (ref 80.0–100.0)
Platelets: 142 K/uL — ABNORMAL LOW (ref 150–400)
RBC: 6.61 MIL/uL — ABNORMAL HIGH (ref 4.22–5.81)
RDW: 14.7 % (ref 11.5–15.5)
WBC: 10.3 K/uL (ref 4.0–10.5)
nRBC: 0 % (ref 0.0–0.2)

## 2024-01-17 LAB — LIPOPROTEIN A (LPA): Lipoprotein (a): 38 nmol/L — ABNORMAL HIGH (ref <75.0)

## 2024-01-17 MED ORDER — TICAGRELOR 90 MG PO TABS
90.0000 mg | ORAL_TABLET | Freq: Two times a day (BID) | ORAL | 5 refills | Status: AC
Start: 2024-01-17 — End: ?
  Filled 2024-01-17: qty 60, 30d supply, fill #0

## 2024-01-17 MED ORDER — ROSUVASTATIN CALCIUM 20 MG PO TABS
20.0000 mg | ORAL_TABLET | Freq: Every day | ORAL | 5 refills | Status: AC
  Filled 2024-01-17: qty 30, 30d supply, fill #0

## 2024-01-17 MED ORDER — CARVEDILOL 6.25 MG PO TABS
6.2500 mg | ORAL_TABLET | Freq: Two times a day (BID) | ORAL | 5 refills | Status: AC
Start: 2024-01-17 — End: ?
  Filled 2024-01-17: qty 60, 30d supply, fill #0

## 2024-01-17 NOTE — Discharge Summary (Addendum)
 Advanced Heart Failure Team  Discharge Summary   Patient ID: Grant Williams. MRN: [id number], DOB/AGE: 09-25-1955 68 y.o. Admit date: 01/15/2024 D/C date:     01/17/2024   Primary Discharge Diagnoses:  Inferior STEMI Hyperlipidemia HTN  Secondary Discharge Diagnoses:  Tobacco abuse Hypokalemia Restless legs  Insomnia T2DM  Hospital Course:  Grant Williams. is a 68 y.o. male with tobacco use, DM, HTN who is s/p PCI to the proximal RCA & distal RCA. Patient presented on 01/15/24 with severe chest pain with ECG notable for inferior ST elevation. Patient was taken to the cath lab, now s/p DES x 2 to the mid RCA, x1 to the distal RPL and x1 to the proximal RCA with PCI complicated by recurrent slow flow/no reflow treated with brilinta  load and Aggrastat  for distal thromboembolization. Aggrastat  on for 12 hrs then stopped. Plan for dual antiplatelet therapy with aspirin  and ticagrelor  for preferably 1 year followed by indefinite ticagrelor  monotherapy. TTE 01/16/24: LVEF 55%, LV with RWMA, RV mildly enlarged, trivial MR. Patient with no further chest pain at discharge.   Follow up arranged with his cardiologist Dr. Uvaldo at [facility].  02/19/24 at 2:45.   Plan for cardiac rehab at discharge.   Discharge Weight Range: 81.6kg Discharge Vitals: Blood pressure (!) 137/95, pulse 97, temperature 98.2 F (36.8 C), temperature source Oral, resp. rate 11, height 5' 9 (1.753 m), weight 81.6 kg, SpO2 95%.  Labs: Lab Results  Component Value Date   WBC 10.3 01/17/2024   HGB 19.0 (H) 01/17/2024   HCT 56.8 (H) 01/17/2024   MCV 85.9 01/17/2024   PLT 142 (L) 01/17/2024    Recent Labs  Lab 01/17/24 0348  NA 138  K 3.7  CL 105  CO2 21*  BUN 13  CREATININE 0.86  CALCIUM  9.0  GLUCOSE 178*   Lab Results  Component Value Date   CHOL 133 01/16/2024   HDL 56 01/16/2024   LDLCALC 66 01/16/2024   TRIG 54 01/16/2024   BNP (last 3 results) No results for input(s): BNP in the last 8760  hours.  ProBNP (last 3 results) No results for input(s): PROBNP in the last 8760 hours.   Diagnostic Studies/Procedures   ECHOCARDIOGRAM COMPLETE Result Date: 01/16/2024    ECHOCARDIOGRAM REPORT   Patient Name:   Grant Williams. Date of Exam: 01/16/2024 Medical Rec #:  [id number]          Height:       69.0 in Accession #:    [id number]         Weight:       180.0 lb Date of Birth:  31-May-1956         BSA:          1.976 m Patient Age:    15 years           BP:           131/82 mmHg Patient Gender: M                  HR:           62 bpm. Exam Location:  Inpatient Procedure: 2D Echo (Both Spectral and Color Flow Doppler were utilized during            procedure). Indications:    acute myocardial infarction  History:        Patient has prior history of Echocardiogram examinations, most  recent 01/07/2021. CAD, Signs/Symptoms:Edema; Risk                 Factors:Diabetes, Hypertension, Dyslipidemia and Sleep Apnea.  Sonographer:    Tinnie Barefoot RDCS Referring Phys: [id number] ANDEE FLATTEN  Sonographer Comments: Suboptimal subcostal window. Image acquisition challenging due to respiratory motion. IMPRESSIONS  1. Left ventricular ejection fraction, by estimation, is 55%. The left ventricle has normal function. The left ventricle demonstrates regional wall motion abnormalities (see scoring diagram/findings for description). There is mild left ventricular hypertrophy. Left ventricular diastolic parameters are consistent with Grade I diastolic dysfunction (impaired relaxation).  2. Right ventricular systolic function is mildly reduced. The right ventricular size is moderately enlarged. Tricuspid regurgitation signal is inadequate for assessing PA pressure.  3. The mitral valve is grossly normal. Trivial mitral valve regurgitation. No evidence of mitral stenosis.  4. The aortic valve is tricuspid. Aortic valve regurgitation is not visualized. No aortic stenosis is present.  5. The inferior vena  cava is normal in size with greater than 50% respiratory variability, suggesting right atrial pressure of 3 mmHg. FINDINGS  Left Ventricle: Left ventricular ejection fraction, by estimation, is 55%. The left ventricle has normal function. The left ventricle demonstrates regional wall motion abnormalities. The left ventricular internal cavity size was normal in size. There is  mild left ventricular hypertrophy. Left ventricular diastolic parameters are consistent with Grade I diastolic dysfunction (impaired relaxation).  LV Wall Scoring: The inferior wall, basal inferolateral segment, mid inferoseptal segment, and basal inferoseptal segment are hypokinetic. Right Ventricle: The right ventricular size is moderately enlarged. No increase in right ventricular wall thickness. Right ventricular systolic function is mildly reduced. Tricuspid regurgitation signal is inadequate for assessing PA pressure. Left Atrium: Left atrial size was normal in size. Right Atrium: Right atrial size was normal in size. Pericardium: There is no evidence of pericardial effusion. Mitral Valve: The mitral valve is grossly normal. Trivial mitral valve regurgitation. No evidence of mitral valve stenosis. Tricuspid Valve: The tricuspid valve is normal in structure. Tricuspid valve regurgitation is trivial. No evidence of tricuspid stenosis. Aortic Valve: The aortic valve is tricuspid. Aortic valve regurgitation is not visualized. No aortic stenosis is present. Pulmonic Valve: The pulmonic valve was normal in structure. Pulmonic valve regurgitation is trivial. No evidence of pulmonic stenosis. Aorta: The aortic root is normal in size and structure. Ascending aorta measurements are within normal limits for age when indexed to body surface area. Venous: The inferior vena cava was not well visualized. The inferior vena cava is normal in size with greater than 50% respiratory variability, suggesting right atrial pressure of 3 mmHg. IAS/Shunts: The  interatrial septum was not well visualized.  LEFT VENTRICLE PLAX 2D LVIDd:         4.60 cm      Diastology LVIDs:         2.40 cm      LV e' medial:    3.70 cm/s LV PW:         1.10 cm      LV E/e' medial:  18.5 LV IVS:        1.00 cm      LV e' lateral:   6.85 cm/s LVOT diam:     2.10 cm      LV E/e' lateral: 10.0 LV SV:         53 LV SV Index:   27 LVOT Area:     3.46 cm  LV Volumes (MOD) LV vol d, MOD A2C: 102.0  ml LV vol s, MOD A2C: 55.0 ml LV SV MOD A2C:     47.0 ml RIGHT VENTRICLE RV Basal diam:  3.00 cm RV S prime:     15.20 cm/s TAPSE (M-mode): 2.2 cm LEFT ATRIUM           Index        RIGHT ATRIUM           Index LA diam:      3.80 cm 1.92 cm/m   RA Area:     14.60 cm LA Vol (A2C): 37.9 ml 19.18 ml/m  RA Volume:   29.90 ml  15.14 ml/m  AORTIC VALVE LVOT Vmax:   76.40 cm/s LVOT Vmean:  50.700 cm/s LVOT VTI:    0.153 m  AORTA Ao Root diam: 3.60 cm Ao Asc diam:  3.80 cm MITRAL VALVE MV Area (PHT): 3.17 cm    SHUNTS MV Decel Time: 239 msec    Systemic VTI:  0.15 m MV E velocity: 68.30 cm/s  Systemic Diam: 2.10 cm MV A velocity: 70.40 cm/s MV E/A ratio:  0.97 Soyla Merck MD Electronically signed by Soyla Merck MD Signature Date/Time: 01/16/2024/6:20:06 PM    Final    CARDIAC CATHETERIZATION Addendum Date: 01/16/2024   1st Diag lesion is 70% stenosed.   Prox LAD to Mid LAD lesion is 70% stenosed.   Ost LAD to Prox LAD lesion is 30% stenosed.   Dist RCA lesion is 80% stenosed.   Prox RCA lesion is 70% stenosed.   RPDA lesion is 100% stenosed.   RPAV lesion is 80% stenosed.   A stent was successfully placed.   A stent was successfully placed.   A stent was successfully placed.   Post intervention, there is a 0% residual stenosis.   Post intervention, there is a 0% residual stenosis.   Post intervention, there is a 0% residual stenosis. 1.  Thrombotic occlusion of distal right coronary artery treated with complex PCI requiring GuideLiner and multiple wires with the placement of 2 drug-eluting stents;  this was complicated by distal embolization to the RPLV and PDA branches as well as slow flow following stenting (successfully reversed with IC verapamil ).  The RPLV thrombus occlusion was treated with 1 drug-eluting stent.  The PDA is a very small vessel and tortuous; no PCI was performed and medical therapy with 12-hour Aggrastat  infusion and pain control will be administered. 2.  High-grade proximal right coronary artery lesion treated with 1 drug-eluting stent 3.  Mild to moderate left-sided disease. Recommendation: Dual antiplatelet therapy with aspirin  and ticagrelor  for preferably 1 year followed by indefinite ticagrelor  monotherapy.  Will continue Aggrastat  for 12 hours.  Pain control and blood pressure control given distal PDA embolization.  The results were reviewed with Dr. Gardenia.  Addendum Date: 01/16/2024   1st Diag lesion is 70% stenosed.   Prox LAD to Mid LAD lesion is 70% stenosed.   Ost LAD to Prox LAD lesion is 30% stenosed.   Dist RCA lesion is 80% stenosed.   Prox RCA lesion is 70% stenosed.   RPDA lesion is 100% stenosed.   RPAV lesion is 80% stenosed.   A stent was successfully placed.   A stent was successfully placed.   A stent was successfully placed.   Post intervention, there is a 0% residual stenosis.   Post intervention, there is a 0% residual stenosis.   Post intervention, there is a 0% residual stenosis. 1.  Thrombotic occlusion of distal right coronary artery treated  with complex PCI requiring GuideLiner and multiple wires with the placement of 2 drug-eluting stents; this was complicated by distal embolization to the RPLV and PDA branches as well as slow flow following stenting (successfully reversed with IC verapamil ).  The RPLV thrombus occlusion was treated with 1 drug-eluting stent.  The PDA is a very small vessel and tortuous; no PCI was performed and medical therapy with 12-hour Aggrastat  infusion and pain control will be administered. 2.  High-grade proximal right  coronary artery lesion treated with 1 drug-eluting stent 3.  Mild to moderate left-sided disease. Recommendation: Dual antiplatelet therapy with aspirin  and ticagrelor  for preferably 1 year followed by indefinite ticagrelor  monotherapy.  Will continue Aggrastat  for 12 hours.  Pain control and blood pressure control given distal PDA embolization.  The results were reviewed with Dr. Gardenia.  Result Date: 01/16/2024   1st Diag lesion is 70% stenosed.   Prox LAD to Mid LAD lesion is 70% stenosed.   Ost LAD to Prox LAD lesion is 30% stenosed.   Dist RCA lesion is 80% stenosed.   Prox RCA lesion is 70% stenosed.   RPDA lesion is 100% stenosed.   RPAV lesion is 80% stenosed.   A stent was successfully placed.   A stent was successfully placed.   A stent was successfully placed.   Post intervention, there is a 0% residual stenosis.   Post intervention, there is a 0% residual stenosis.   Post intervention, there is a 0% residual stenosis. 1.  Thrombotic occlusion of distal right coronary artery treated with 2 drug-eluting stents this was complicated by distal embolization to the RPL V and PDA branches.  The RPL V thrombus occlusion was treated with 1 drug-eluting stent.  The PDA is a very small vessel and tortuous and no PCI was pursued. 2.  High-grade proximal right coronary artery lesion treated with 1 drug-eluting stent 3.  Mild to moderate left-sided disease. Recommendation: Dual antiplatelet therapy with aspirin  and ticagrelor  for preferably 1 year followed by indefinite ticagrelor  monotherapy.  Will continue Aggrastat  for 12 hours.  Pain control and blood pressure control given distal PDA embolization.  The results were reviewed with Dr. Gardenia.    Discharge Medications   Allergies as of 01/17/2024       Reactions   Nsaids Other (See Comments)   Gastric bypass    Other Other (See Comments)   Steroids   Does not tolerate bad for stomach has to take med before and after if uses any steroid    Penicillins Other (See Comments)   Unknown childhood allergic reaction - no other information available        Medication List     STOP taking these medications    iron polysaccharides 150 MG capsule Commonly known as: NIFEREX   metoprolol  tartrate 100 MG tablet Commonly known as: LOPRESSOR        TAKE these medications    acetaminophen  325 MG tablet Commonly known as: TYLENOL  Take 2 tablets (650 mg total) by mouth every 6 (six) hours as needed for mild pain or headache (fever >/= 101).   albuterol  108 (90 Base) MCG/ACT inhaler Commonly known as: VENTOLIN  HFA Inhale 2 puffs into the lungs every 6 (six) hours as needed for wheezing or shortness of breath.   amLODipine  10 MG tablet Commonly known as: NORVASC  Take 1 tablet (10 mg total) by mouth daily. Please contact the office to schedule appointment. 2ed attempt.   aspirin  EC 81 MG tablet Take 1 tablet (81  mg total) by mouth daily. Swallow whole.   CALCIUM  CITRATE-VITAMIN D  PO Take 1 tablet by mouth 2 (two) times daily. 600 + vit d3   carvedilol  6.25 MG tablet Commonly known as: COREG  Take 1 tablet (6.25 mg total) by mouth 2 (two) times daily.   dapagliflozin  propanediol 10 MG Tabs tablet Commonly known as: Farxiga  Take 1 tablet (10 mg total) by mouth daily.   DULoxetine  60 MG capsule Commonly known as: CYMBALTA  Take 60 mg by mouth daily.   ergocalciferol  1.25 MG (50000 UT) capsule Commonly known as: VITAMIN D2 Take 50,000 Units by mouth 3 (three) times a week. Mon / wed /and Fri   ferrous gluconate 324 MG tablet Commonly known as: FERGON Take 324 mg by mouth daily with breakfast.   FreeStyle Libre 3 Plus Sensor Misc Change sensor every 15 days.   gabapentin  800 MG tablet Commonly known as: NEURONTIN  Take 800 mg by mouth 4 (four) times daily.   ipratropium 17 MCG/ACT inhaler Commonly known as: ATROVENT  HFA Inhale 2 puffs into the lungs 2 (two) times daily. What changed:  when to take this reasons  to take this   metFORMIN  500 MG 24 hr tablet Commonly known as: GLUCOPHAGE -XR TAKE 4 TABLETS BY MOUTH EVERY DAY   nitroGLYCERIN  0.4 MG SL tablet Commonly known as: NITROSTAT  Place 0.4 mg under the tongue every 5 (five) minutes as needed for chest pain.   nortriptyline  10 MG capsule Commonly known as: PAMELOR  Take 3 capsules by mouth at bedtime.   omeprazole 20 MG capsule Commonly known as: PRILOSEC Take 20 mg by mouth every morning.   Ozempic  (2 MG/DOSE) 8 MG/3ML Sopn Generic drug: Semaglutide  (2 MG/DOSE) INJECT 2MG  SUBCUTANEOUSLY EVERY WEEK   rOPINIRole  1 MG tablet Commonly known as: REQUIP  Take 2 mg by mouth at bedtime.   rosuvastatin  20 MG tablet Commonly known as: CRESTOR  Take 1 tablet (20 mg total) by mouth daily. What changed:  medication strength how much to take when to take this additional instructions   ticagrelor  90 MG Tabs tablet Commonly known as: BRILINTA  Take 1 tablet (90 mg total) by mouth 2 (two) times daily.   traZODone  50 MG tablet Commonly known as: DESYREL  Take 100 mg by mouth at bedtime as needed for sleep.   valsartan  160 MG tablet Commonly known as: Diovan  Take 1 tablet (160 mg total) by mouth daily.        Disposition   The patient will be discharged in stable condition to home. Discharge Instructions     Amb Referral to Cardiac Rehabilitation   Complete by: As directed    Diagnosis:  Coronary Stents STEMI PTCA     After initial evaluation and assessments completed: Virtual Based Care may be provided alone or in conjunction with Phase 2 Cardiac Rehab based on patient barriers.: Yes   Intensive Cardiac Rehabilitation (ICR) [facility] location only OR Traditional Cardiac Rehabilitation (TCR) *If criteria for ICR are not met will enroll in TCR [facility] only): Yes       Follow-up Information     Arby Lyle LABOR, NP Follow up.   Specialty: Nurse Practitioner Why: please contact PCP for follow up appt in 7-14 days Contact  information: [address] [place] [address] [phone]                   APP Duration of Discharge Encounter: 15 minutes  Signed, Beckey LITTIE Coe AGACNP-BC  01/17/2024, 10:03 AM   Patient seen with NP,  I formulated the plan and agree with the above note.   Please see my separate note from earlier today outlining my plan for him.  MD Duration of Discharge Encounter 32 minutes  Ezra Shuck 01/17/2024 12:32 PM

## 2024-01-17 NOTE — Progress Notes (Addendum)
 Advanced Heart Failure Rounding Note  Cardiologist: Dorn Lesches, MD  Chief Complaint: STEMI Subjective:   Inferior STEMI s/p 2 DES to the mid RCA then a third in the distal RPL, and a 4th to the proximal RCA, PCI complicated by recurrent slow flow/no reflow with persistent chest pain throughout the procedure, treated with ticagrelor  load and tirofiban  for distal thromboembolization. PDA stumped off very distally (too small for intervention), otherwise TIMI 3 flow through RCA. Quite hypertensive during procedure, requiring hydralazine  boluses.   Patient resting comfortably in bed. Family at bedside. Denies CP/SOB. Has been getting up and moving around.   Objective:   Weight Range: 81.6 kg Body mass index is 26.58 kg/m.   Vital Signs:   Temp:  [97.9 F (36.6 C)-98.8 F (37.1 C)] 98.2 F (36.8 C) (07/28 0307) Pulse Rate:  [49-103] 81 (07/28 0500) Resp:  [14-37] 22 (07/28 0500) BP: (101-159)/(73-107) 134/94 (07/28 0500) SpO2:  [89 %-97 %] 93 % (07/28 0500) Last BM Date : 01/16/24  Weight change: Filed Weights   01/15/24 2052 01/15/24 2152  Weight: 81.6 kg 81.6 kg    Intake/Output:   Intake/Output Summary (Last 24 hours) at 01/17/2024 0733 Last data filed at 01/17/2024 0400 Gross per 24 hour  Intake 819.49 ml  Output 1550 ml  Net -730.51 ml    Physical Exam   General:  well appearing.  No respiratory difficulty Neck: JVD ~6 cm.  Cor: Regular rate & rhythm. No murmurs. Lungs: clear Extremities: no edema  Neuro: alert & oriented x 3. Affect pleasant.  Telemetry   NSR 80s-90s  Labs   CBC Recent Labs    01/16/24 0223 01/17/24 0348  WBC 9.9 10.3  HGB 19.2* 19.0*  HCT 55.2* 56.8*  MCV 84.4 85.9  PLT 123* 142*   Basic Metabolic Panel Recent Labs    92/72/74 1149 01/17/24 0348  NA 138 138  K 3.8 3.7  CL 106 105  CO2 19* 21*  GLUCOSE 181* 178*  BUN 11 13  CREATININE 0.81 0.86  CALCIUM  9.1 9.0   Liver Function Tests No results for input(s):  AST, ALT, ALKPHOS, BILITOT, PROT, ALBUMIN in the last 72 hours. No results for input(s): LIPASE, AMYLASE in the last 72 hours. Cardiac Enzymes No results for input(s): CKTOTAL, CKMB, CKMBINDEX, TROPONINI in the last 72 hours.  BNP: BNP (last 3 results) No results for input(s): BNP in the last 8760 hours.  ProBNP (last 3 results) No results for input(s): PROBNP in the last 8760 hours.   D-Dimer No results for input(s): DDIMER in the last 72 hours. Hemoglobin A1C Recent Labs    01/16/24 2208  HGBA1C 5.4   Fasting Lipid Panel Recent Labs    01/16/24 0223  CHOL 133  HDL 56  LDLCALC 66  TRIG 54  CHOLHDL 2.4   Thyroid  Function Tests No results for input(s): TSH, T4TOTAL, T3FREE, THYROIDAB in the last 72 hours.  Invalid input(s): FREET3  Other results:  Imaging  ECHOCARDIOGRAM COMPLETE Result Date: 01/16/2024    ECHOCARDIOGRAM REPORT   Patient Name:   Grant Williams. Date of Exam: 01/16/2024 Medical Rec #:  [id number]          Height:       69.0 in Accession #:    [id number]         Weight:       180.0 lb Date of Birth:  10-14-1955         BSA:  1.976 m Patient Age:    68 years           BP:           131/82 mmHg Patient Gender: M                  HR:           62 bpm. Exam Location:  Inpatient Procedure: 2D Echo (Both Spectral and Color Flow Doppler were utilized during            procedure). Indications:    acute myocardial infarction  History:        Patient has prior history of Echocardiogram examinations, most                 recent 01/07/2021. CAD, Signs/Symptoms:Edema; Risk                 Factors:Diabetes, Hypertension, Dyslipidemia and Sleep Apnea.  Sonographer:    Tinnie Barefoot RDCS Referring Phys: [id number] ANDEE FLATTEN  Sonographer Comments: Suboptimal subcostal window. Image acquisition challenging due to respiratory motion. IMPRESSIONS  1. Left ventricular ejection fraction, by estimation, is 55%. The left ventricle has  normal function. The left ventricle demonstrates regional wall motion abnormalities (see scoring diagram/findings for description). There is mild left ventricular hypertrophy. Left ventricular diastolic parameters are consistent with Grade I diastolic dysfunction (impaired relaxation).  2. Right ventricular systolic function is mildly reduced. The right ventricular size is moderately enlarged. Tricuspid regurgitation signal is inadequate for assessing PA pressure.  3. The mitral valve is grossly normal. Trivial mitral valve regurgitation. No evidence of mitral stenosis.  4. The aortic valve is tricuspid. Aortic valve regurgitation is not visualized. No aortic stenosis is present.  5. The inferior vena cava is normal in size with greater than 50% respiratory variability, suggesting right atrial pressure of 3 mmHg. FINDINGS  Left Ventricle: Left ventricular ejection fraction, by estimation, is 55%. The left ventricle has normal function. The left ventricle demonstrates regional wall motion abnormalities. The left ventricular internal cavity size was normal in size. There is  mild left ventricular hypertrophy. Left ventricular diastolic parameters are consistent with Grade I diastolic dysfunction (impaired relaxation).  LV Wall Scoring: The inferior wall, basal inferolateral segment, mid inferoseptal segment, and basal inferoseptal segment are hypokinetic. Right Ventricle: The right ventricular size is moderately enlarged. No increase in right ventricular wall thickness. Right ventricular systolic function is mildly reduced. Tricuspid regurgitation signal is inadequate for assessing PA pressure. Left Atrium: Left atrial size was normal in size. Right Atrium: Right atrial size was normal in size. Pericardium: There is no evidence of pericardial effusion. Mitral Valve: The mitral valve is grossly normal. Trivial mitral valve regurgitation. No evidence of mitral valve stenosis. Tricuspid Valve: The tricuspid valve is  normal in structure. Tricuspid valve regurgitation is trivial. No evidence of tricuspid stenosis. Aortic Valve: The aortic valve is tricuspid. Aortic valve regurgitation is not visualized. No aortic stenosis is present. Pulmonic Valve: The pulmonic valve was normal in structure. Pulmonic valve regurgitation is trivial. No evidence of pulmonic stenosis. Aorta: The aortic root is normal in size and structure. Ascending aorta measurements are within normal limits for age when indexed to body surface area. Venous: The inferior vena cava was not well visualized. The inferior vena cava is normal in size with greater than 50% respiratory variability, suggesting right atrial pressure of 3 mmHg. IAS/Shunts: The interatrial septum was not well visualized.  LEFT VENTRICLE PLAX 2D LVIDd:  4.60 cm      Diastology LVIDs:         2.40 cm      LV e' medial:    3.70 cm/s LV PW:         1.10 cm      LV E/e' medial:  18.5 LV IVS:        1.00 cm      LV e' lateral:   6.85 cm/s LVOT diam:     2.10 cm      LV E/e' lateral: 10.0 LV SV:         53 LV SV Index:   27 LVOT Area:     3.46 cm  LV Volumes (MOD) LV vol d, MOD A2C: 102.0 ml LV vol s, MOD A2C: 55.0 ml LV SV MOD A2C:     47.0 ml RIGHT VENTRICLE RV Basal diam:  3.00 cm RV S prime:     15.20 cm/s TAPSE (M-mode): 2.2 cm LEFT ATRIUM           Index        RIGHT ATRIUM           Index LA diam:      3.80 cm 1.92 cm/m   RA Area:     14.60 cm LA Vol (A2C): 37.9 ml 19.18 ml/m  RA Volume:   29.90 ml  15.14 ml/m  AORTIC VALVE LVOT Vmax:   76.40 cm/s LVOT Vmean:  50.700 cm/s LVOT VTI:    0.153 m  AORTA Ao Root diam: 3.60 cm Ao Asc diam:  3.80 cm MITRAL VALVE MV Area (PHT): 3.17 cm    SHUNTS MV Decel Time: 239 msec    Systemic VTI:  0.15 m MV E velocity: 68.30 cm/s  Systemic Diam: 2.10 cm MV A velocity: 70.40 cm/s MV E/A ratio:  0.97 Soyla Merck MD Electronically signed by Soyla Merck MD Signature Date/Time: 01/16/2024/6:20:06 PM    Final     Medications:   Scheduled  Medications:  amLODipine   10 mg Oral Daily   aspirin  EC  81 mg Oral Daily   carvedilol   6.25 mg Oral BID   Chlorhexidine  Gluconate Cloth  6 each Topical Daily   dapagliflozin  propanediol  10 mg Oral Daily   DULoxetine   60 mg Oral Daily   gabapentin   400 mg Oral QID   insulin  aspart  0-5 Units Subcutaneous QHS   insulin  aspart  0-9 Units Subcutaneous TID WC   irbesartan   150 mg Oral Daily   rOPINIRole   2 mg Oral QHS   rosuvastatin   20 mg Oral Daily   sodium chloride  flush  3 mL Intravenous Q12H   ticagrelor   90 mg Oral BID    Infusions:   PRN Medications: acetaminophen , hydrALAZINE , morphine  injection, nitroGLYCERIN , ondansetron  (ZOFRAN ) IV, mouth rinse, sodium chloride  flush, traZODone   Patient Profile   Grant Williams. is a 68 y.o. male with tobacco use, DM, HTN who is s/p PCI to the proximal RCA & distal RCA. Patient presented on 01/15/24 with severe chest pain with ECG notable for inferior ST elevation.   Assessment/Plan  Inferior STEMI - s/p DES x 2 to the mid RCA, x1 to the distal RPL and x1 to the proximal RCA with PCI complicated by recurrent slow flow/no reflow treated with brilinta  load and Aggrastat  for distal thromboembolization.  - PDA stumped off distally.  - Continue DAPT with brilinta /ASA - TTE 01/16/24: LVEF 55%, LV with RWMA, RV mildly enlarged, trivial MR -  Continue coreg  6.25mg  BID (decreased yesterday 2/2 bradycardia).  Hyperlipidemia - LDL 66;  Continue crestor  20mg  daily  Hypertension - Continue home meds: amlodipine  10mg  daily, coreg  6.25mg  BID, irbesartan  150mg  daily.  Tobacco abuse Hypokalemia - resolved. 3.7 today.  5. Restless legs / insomnia             - Back on home home meds.  6. T2DM             - Holding metformin              - Continue Farxiga              - SSI.   Plan for discharge today. D/C summary to follow. Will work on arranging Cardiology follow up for him.  Length of Stay: 2  Beckey LITTIE Coe, NP  01/17/2024, 7:33  AM  Advanced Heart Failure Team Pager [phone] (M-F; 7a - 5p)  Please contact [facility] for night-coverage after hours (5p -7a ) and weekends on [email or web address]  Patient seen with NP, I formulated the plan and agree with the above note.   No complaints this morning, no chest pain.   Echo showed EF 55%, mild RV dysfunction.   General: NAD Neck: No JVD, no thyromegaly or thyroid  nodule.  Lungs: Clear to auscultation bilaterally with normal respiratory effort. CV: Nondisplaced PMI.  Heart regular S1/S2, no S3/S4, no murmur.  No peripheral edema.    Abdomen: Soft, nontender, no hepatosplenomegaly, no distention.  Skin: Intact without lesions or rashes.  Neurologic: Alert and oriented x 3.  Psych: Normal affect. Extremities: No clubbing or cyanosis.  HEENT: Normal.   Status post inferior MI, DES x 2 to mid RCA, DES to PLV, DES to proximal RCA.  PDA remained occluded distally. There is 70% proximal LAD stenosis that was not treated.  No further chest pain.   He is stable this morning, LV EF preserved with mild RV dysfunction. Ok for home today.   Cardiac meds for home: Brilinta  90 bid ASA 81 Crestor  20 mg daily (increase from 10 mg every other day) Amlodipine  10 daily Coreg  6.25 mg bid Valsartan  160 daily.   Will need cardiac rehab.  Followup with primary cardiologist.   Ezra Shuck 01/17/2024 8:53 AM

## 2024-01-17 NOTE — Discharge Instructions (Signed)

## 2024-01-17 NOTE — Progress Notes (Signed)
 Pt has been ambulating without difficulty. BP elevated this am.   Discussed with pt and wife MI, stents, restrictions, Brilinta  importance, diet, tobacco cessation, NTG and CRPII. Pt receptive. Will refer to [place] CRPII. He thinks he will cut down to 1-2 cigars a day. [id number] Aliene Aris BS, ACSM-CEP 01/17/2024 9:06 AM

## 2024-01-17 NOTE — TOC Transition Note (Addendum)
 Transition of Care [facility]) - Discharge Note   Patient Details  Name: Grant Williams. MRN: [id number] Date of Birth: 1956-03-08  [facility] CM/SW Contact:  Justina Delcia Czar, RN Phone Number: [phone] 01/17/2024, 9:27 AM   Clinical Narrative:     Spoke to pt and wife at bedside. Pt states he was independent pta. Did not have any DME/HH in the past. Drives to appts. Wife can assist as needed.  Discussed heart healthy diet, smoking cessation, medication to assist with insomnia. Explained to pt/wife to discuss herbal supplements with PCP or specialist before taking as they may interfere with his medication.   Pt requested to make his PCP hospital follow up appt.   Final next level of care: Home/Self Care Barriers to Discharge: No Barriers Identified   Patient Goals and CMS Choice Patient states their goals for this hospitalization and ongoing recovery are:: wants to remain independent          Discharge Placement                       Discharge Plan and Services Additional resources added to the After Visit Summary for     Discharge Planning Services: CM Consult                                 Social Drivers of Health (SDOH) Interventions SDOH Screenings   Food Insecurity: No Food Insecurity (01/16/2024)  Housing: Unknown (01/16/2024)  Transportation Needs: No Transportation Needs (01/16/2024)  Utilities: Not At Risk (01/16/2024)  Financial Resource Strain: Low Risk  (08/13/2023)   Received from [facility]  Physical Activity: Sufficiently Active (08/13/2023)   Received from [facility]  Social Connections: Socially Integrated (08/13/2023)   Received from [facility]  Stress: No Stress Concern Present (08/13/2023)   Received from [facility]  Tobacco Use: Medium Risk (01/15/2024)     Readmission Risk Interventions     No data to display

## 2024-01-17 NOTE — Telephone Encounter (Signed)
 Patient Product/process development scientist completed.    The patient is insured through [employer]. Patient has Medicare and is not eligible for a copay card, but may be able to apply for patient assistance or Medicare RX Payment Plan (Patient Must reach out to their plan, if eligible for payment plan), if available.    Ran test claim for Brilinta  90 mg and the current 30 day co-pay is $0.00.  Ran test claim for ticagrelor  (Brilinta ) 90 mg and the current 30 day co-pay is $0.00.  This test claim was processed through [facility]- copay amounts may vary at other pharmacies due to pharmacy/plan contracts, or as the patient moves through the different stages of their insurance plan.     Reyes Sharps, CPHT Pharmacy Technician III Certified Patient Advocate [facility] Patient Advocate Team Direct Number: [phone]  Fax: [phone]

## 2024-01-24 NOTE — Progress Notes (Signed)
 Subjective   Patient ID:  Grant Williams. is a 68 y.o. (DOB 1955/10/25) male presenting today following discharge from the inpatient setting. Accompanied by his wife, Grant Williams, today  History of Present Illness The patient presents for evaluation following a recent heart attack, diabetes management, and medication adjustments.  He experienced chest pain and shortness of breath while gardening in hot weather, which led to his hospitalization. He was diagnosed with a STEMI and underwent a 4-vessel stent procedure. He was discharged 2 days later and reports feeling significantly better than before the procedure. He has been adhering to activity restrictions and is scheduled to receive a heart monitor on Wednesday, which he will wear for 2 weeks before his next appointment with Dr. Uvaldo. He reports no further episodes of chest pain or shortness of breath. He also reports no dizziness.   He had a fainting episode at home, which was attributed to his blood pressure medication, leading to a reduction in dosage of his amlodipine by cardiology. He did f/u with them for syncopal episode.  He will be getting heart monitor placed later this week. His metoprolol was discontinued and replaced with carvedilol. His valsartan dosage was continued.  He had a follow-up appointment with his cardiologist last week, during which his amlodipine dosage was reduced due to low blood pressure readings.  We did review medications in detail today as he had some questions.  For his cardiac medications, he is currently taking aspirin and Brilinta for blood thinning, carvedilol 6.25 mg twice daily, valsartan 160 mg daily, Brilinta 90 mg twice daily, and aspirin once daily.  He has been taking CBD gummies and has reduced his gabapentin dosage from 800 mg four times daily to 800 mg three times daily. He has also halved his trazodone dosage from 75 mg to 50 mg. He did clear his CBD Gummies with his cardiologist  His A1c levels are  in the 5s. He continues to take metformin and Ozempic 2 mg, as well as a statin.  Social History: Diet: He has been through most of the Mediterranean diet when he had the bypass. Sleep: He is currently taking 2-hour cat naps and has reduced his trazodone dosage to avoid daytime fogginess. Living Condition: He lives with his mother and his partner's mother, who both require care.   Discharge Date:: 01/17/24   Primary discharge diagnosis:: Inferior STEMI  7 calendar days post-discharge:: 01/24/2024  14 calendar days post-discharge:: 01/31/2024  All pending tests and treatments at the time of discharge, if present, reviewed: Yes  Discharge Summary Reviewed: Yes  Patient Current Location: Home  Medication Reconciliation completed:  Yes  Medication Adherence:  no known adherence challenges  Barriers to Medication Adherence documented by nurse call:    None   Reviewed and updated this visit by provider: Tobacco  Allergies  Meds  Problems  Med Hx  Surg Hx  Fam Hx        Review of Systems is complete and negative except as noted.   Objective   Vitals:   01/24/24 0802  BP: 130/74  Patient Position: Sitting  Pulse: 69  Resp: 16  Height: 5' 8 (1.727 m)  Weight: 181 lb 3.2 oz (82.2 kg)  SpO2: 96%  BMI (Calculated): 27.6  PainSc: 0-No pain   Wt Readings from Last 3 Encounters:  01/24/24 181 lb 3.2 oz (82.2 kg)  01/19/24 179 lb (81.2 kg)  09/22/23 187 lb (84.8 kg)   General:  Well developed, Well nourished, No distress.  Very pleasant 68yo Caucasian male in NAD HEENT: Normocephalic, atraumatic; Pupils equal, round, reactive to light and accommodation; Conjunctiva normal; Bilateral external auditory canals and tympanic membranes normal; Nares normal; Oropharynx moist and clear Neck:  Supple with normal range of motion; No lymphadenopathy  CV:  S1S2, Regular rate and rhythm, without murmur, gallops or rubs Lungs:  Clear to auscultation bilaterally with normal  effort Abdomen:  Bowel sounds active, soft, non-tender, non-distended, No hepatosplenomegaly or masses  Skin:  No focal rashes  Extremities:  No clubbing, cyanosis or edema.  Dorsalis pedis/posterior tibial pulses 2+ Neuro:  No focal deficits; Cranial nerves II-XII intact      Assessment and Plan  1. Hospital discharge follow-up (Primary) -     CBC And Differential; Future; Expected date: 02/24/2024 -     Comprehensive Metabolic Panel; Future; Expected date: 03/20/2024 -     Magnesium; Future 2. ST elevation myocardial infarction (STEMI) in recovery phase (*) -     CBC And Differential; Future; Expected date: 02/24/2024 -     Comprehensive Metabolic Panel; Future; Expected date: 03/20/2024 -     Magnesium; Future 3. Essential hypertension -     CBC And Differential; Future; Expected date: 02/24/2024 -     Comprehensive Metabolic Panel; Future; Expected date: 03/20/2024 -     Magnesium; Future 4. Mixed hyperlipidemia -     Lipid Panel; Future; Expected date: 03/20/2024 5. Type 2 diabetes mellitus with diabetic neuropathy, without long-term current use of insulin (*) 6. Insomnia, unspecified type -     traZODone (DESYREL) 50 mg tablet; Take one tablet (50 mg dose) by mouth at bedtime., Starting Mon 01/24/2024, Print 7. Medication management Other orders -     gabapentin (NEURONTIN) 800 mg tablet; Take one tablet (800 mg dose) by mouth 3 (three) times a day., Starting Mon 01/24/2024, Normal   Patient presents today for hospital follow-up. Hospital discharge summary has been reviewed in depth. Discharge medications have been reconciled.  I have reviewed the discharge plans and course since discharge.  Patient has been made aware of discharge planning and this has been discussed with patient and caregiver. Any recommendations made during hospitalization and/or upon discharge had been reviewed and implemented.  After discussion, the patient understands these plans and questions related to the plans  have been addressed.  Assessment & Plan 1-4. Recent myocardial infarction. - He experienced a STEMI and underwent a 4-vessel stent placement. - His current medications include amlodipine 5 mg once daily, carvedilol 6.25 mg twice daily, valsartan 160 mg once daily, Brilinta 90 mg twice daily, and aspirin once daily. - He reports no further chest pain or shortness of breath. His blood pressure is well-controlled today. An echocardiogram performed in the hospital revealed normal ejection fraction in the left ventricle. - He is advised to adhere to a heart-healthy diet, avoid high-sodium and fried foods, and limit fast food intake. A Mediterranean diet is recommended, focusing on lean meats, non-starchy fruits and vegetables, and using olive oil instead of butter. A pamphlet detailing this diet will be provided. Once cleared by cardiology, he should aim to be as active as possible. Blood work will be rechecked today.  5. Diabetes mellitus. - His A1c levels are in the 5s, indicating good control of his diabetes. - He continues to take metformin and Ozempic 2 mg. - He is advised to maintain his current diabetes management regimen and continue monitoring his blood sugar levels.  6-7. Medication management. - His gabapentin dosage has been  reduced from 800 mg four times daily to 800 mg three times daily. - His trazodone dosage has also been reduced from 75 mg to 50 mg. - He has been advised to check with his cardiologist before trying any new supplements or medications, such as Relaxium, to ensure there are no interactions with his current medications. - He is instructed to contact the office if there are any discrepancies with his medication bottles at home.   Follow-up: The patient will follow up on 02/11/2024.    Barriers of Care: None   Follow up in about 3 months (around 04/25/2024) for F/U with labs.   Risks, benefits, and alternatives of the medications and treatment plan prescribed today  were discussed, and patient/caregiver expressed understanding. Plan follow-up as discussed or as needed if any worsening symptoms or change in condition.

## 2024-03-09 ENCOUNTER — Other Ambulatory Visit: Payer: Self-pay | Admitting: "Endocrinology

## 2024-03-09 DIAGNOSIS — E1140 Type 2 diabetes mellitus with diabetic neuropathy, unspecified: Secondary | ICD-10-CM

## 2024-03-10 NOTE — Telephone Encounter (Signed)
 Patient is scheduled for 03/13/2024 at 1:40 PM with Dr. Dartha.

## 2024-03-13 ENCOUNTER — Ambulatory Visit: Admitting: "Endocrinology

## 2024-03-13 ENCOUNTER — Encounter: Payer: Self-pay | Admitting: "Endocrinology

## 2024-03-13 VITALS — BP 140/80 | HR 84 | Ht 69.0 in | Wt 175.0 lb

## 2024-03-13 DIAGNOSIS — Z7985 Long-term (current) use of injectable non-insulin antidiabetic drugs: Secondary | ICD-10-CM

## 2024-03-13 DIAGNOSIS — E7800 Pure hypercholesterolemia, unspecified: Secondary | ICD-10-CM | POA: Diagnosis not present

## 2024-03-13 DIAGNOSIS — E11649 Type 2 diabetes mellitus with hypoglycemia without coma: Secondary | ICD-10-CM | POA: Diagnosis not present

## 2024-03-13 DIAGNOSIS — Z7984 Long term (current) use of oral hypoglycemic drugs: Secondary | ICD-10-CM

## 2024-03-13 MED ORDER — SEMAGLUTIDE (1 MG/DOSE) 4 MG/3ML ~~LOC~~ SOPN: 1.0000 mg | PEN_INJECTOR | SUBCUTANEOUS | 1 refills | Status: AC

## 2024-03-13 NOTE — Progress Notes (Signed)
 Patient ID: Grant Landin., male   DOB: 09-30-1955, 68 y.o.   MRN: [id number]     Reason for Appointment: Type II Diabetes follow-up   History of Present Illness   Diagnosis date:  2008   Previous history: He was apparently taking metformin  2000 mg, Amaryl  for a few years before being referred here He was started on insulin  when his A1c was 11% compared to 9% prior to that His PCP also tried him on Farxiga  but this caused very frequent urination and he could not tolerate the 5 mg dose  He took insulin  2015-2018; he had gastric bypass surgery in 2018.   Oral hypoglycemic drugs previously used are: Invokana , Farxiga , metformin , Amaryl , Levemir  and Humalog  insulin  Insulin  was used only in the hospital in 1/23  Recent history: Non-insulin  hypoglycemic drugs: Farxiga  10 mg, metformin  ER 2000 mg and Ozempic  2 mg weekly    Side effects from medications: None  CGM interpretation: At today's visit, we reviewed her CGM downloads. The full report is scanned in the media. Reviewing the CGM trends, BG are  Low to normal almost across all day, with some rare highs.   Weight control:  Wt Readings from Last 3 Encounters:  03/13/24 175 lb (79.4 kg)  01/15/24 180 lb (81.6 kg)  01/04/23 197 lb (89.4 kg)         Diabetes labs:  Lab Results  Component Value Date   HGBA1C 5.4 01/16/2024   HGBA1C 5.9 (A) 01/04/2023   HGBA1C 6.2 01/04/2023   Lab Results  Component Value Date   LDLCALC 66 01/16/2024   CREATININE 0.86 01/17/2024    Allergies as of 03/13/2024       Reactions   Nsaids Other (See Comments)   Gastric bypass    Other Other (See Comments)   Steroids   Does not tolerate bad for stomach has to take med before and after if uses any steroid   Penicillins Other (See Comments)   Unknown childhood allergic reaction - no other information available        Medication List        Accurate as of March 13, 2024  2:20 PM. If you have any questions, ask your nurse or  doctor.          STOP taking these medications    Ozempic  (2 MG/DOSE) 8 MG/3ML Sopn Generic drug: Semaglutide  (2 MG/DOSE) Replaced by: Semaglutide  (1 MG/DOSE) 4 MG/3ML Sopn Stopped by: Daylah Sayavong       TAKE these medications    acetaminophen  325 MG tablet Commonly known as: TYLENOL  Take 2 tablets (650 mg total) by mouth every 6 (six) hours as needed for mild pain or headache (fever >/= 101).   albuterol  108 (90 Base) MCG/ACT inhaler Commonly known as: VENTOLIN  HFA Inhale 2 puffs into the lungs every 6 (six) hours as needed for wheezing or shortness of breath.   amLODipine  10 MG tablet Commonly known as: NORVASC  Take 1 tablet (10 mg total) by mouth daily. Please contact the office to schedule appointment. 2ed attempt.   aspirin  EC 81 MG tablet Take 1 tablet (81 mg total) by mouth daily. Swallow whole.   CALCIUM  CITRATE-VITAMIN D  PO Take 1 tablet by mouth 2 (two) times daily. 600 + vit d3   carvedilol  6.25 MG tablet Commonly known as: COREG  Take 1 tablet (6.25 mg total) by mouth 2 (two) times daily.   dapagliflozin  propanediol 10 MG Tabs tablet Commonly known as: Farxiga  Take 1 tablet (10 mg  total) by mouth daily.   DULoxetine  60 MG capsule Commonly known as: CYMBALTA  Take 60 mg by mouth daily.   ergocalciferol  1.25 MG (50000 UT) capsule Commonly known as: VITAMIN D2 Take 50,000 Units by mouth 3 (three) times a week. Mon / wed /and Fri   ferrous gluconate 324 MG tablet Commonly known as: FERGON Take 324 mg by mouth daily with breakfast.   FreeStyle Libre 3 Plus Sensor Misc Change sensor every 15 days.   gabapentin  800 MG tablet Commonly known as: NEURONTIN  Take 800 mg by mouth 4 (four) times daily.   ipratropium 17 MCG/ACT inhaler Commonly known as: ATROVENT  HFA Inhale 2 puffs into the lungs 2 (two) times daily. What changed:  when to take this reasons to take this   metFORMIN  500 MG 24 hr tablet Commonly known as: GLUCOPHAGE -XR TAKE 4 TABLETS  BY MOUTH EVERY DAY   nitroGLYCERIN  0.4 MG SL tablet Commonly known as: NITROSTAT  Place 0.4 mg under the tongue every 5 (five) minutes as needed for chest pain.   nortriptyline  10 MG capsule Commonly known as: PAMELOR  Take 3 capsules by mouth at bedtime.   omeprazole 20 MG capsule Commonly known as: PRILOSEC Take 20 mg by mouth every morning.   rOPINIRole  1 MG tablet Commonly known as: REQUIP  Take 2 mg by mouth at bedtime.   rosuvastatin  20 MG tablet Commonly known as: CRESTOR  Take 1 tablet (20 mg total) by mouth daily.   Semaglutide  (1 MG/DOSE) 4 MG/3ML Sopn Inject 1 mg as directed once a week. Replaces: Ozempic  (2 MG/DOSE) 8 MG/3ML Sopn Started by: Obadiah Birmingham   ticagrelor  90 MG Tabs tablet Commonly known as: BRILINTA  Take 1 tablet (90 mg total) by mouth 2 (two) times daily.   traZODone  50 MG tablet Commonly known as: DESYREL  Take 100 mg by mouth at bedtime as needed for sleep.   valsartan  160 MG tablet Commonly known as: Diovan  Take 1 tablet (160 mg total) by mouth daily.        Allergies:  Allergies  Allergen Reactions   Nsaids Other (See Comments)    Gastric bypass    Other Other (See Comments)    Steroids   Does not tolerate bad for stomach has to take med before and after if uses any steroid    Penicillins Other (See Comments)    Unknown childhood allergic reaction - no other information available    Past Medical History:  Diagnosis Date   Anemia    iron infusions   Coronary artery disease    Cough syncope    Diabetes mellitus without complication (HCC)    Fibromyalgia    GERD (gastroesophageal reflux disease)    History of kidney stones    HTN (hypertension)    Hyperlipidemia    Hypertension    Morbid obesity (HCC)    Pneumonia    Sleep apnea    dont tolerate cpap   SOB (shortness of breath)    Uncontrolled type 2 diabetes mellitus with complication    Vitamin B12 deficiency     Past Surgical History:  Procedure Laterality Date    CORONARY/GRAFT ACUTE MI REVASCULARIZATION N/A 01/15/2024   Procedure: Coronary/Graft Acute MI Revascularization;  Surgeon: Thukkani, Arun K, MD;  Location: [facility];  Service: Cardiovascular;  Laterality: N/A;   ELBOW SURGERY Right    HERNIA REPAIR     left and right inguinal , umbilical also   LEFT HEART CATH AND CORONARY ANGIOGRAPHY N/A 01/15/2024   Procedure: LEFT HEART  CATH AND CORONARY ANGIOGRAPHY;  Surgeon: Wendel Lurena POUR, MD;  [facility]: [facility]VASIVE CV LAB;  Service: Cardiovascular;  Laterality: N/A;   ROUX-EN-Y GASTRIC BYPASS     08-2016  something nicked and pt. had to have 3 units of blood   SHOULDER ARTHROSCOPY Right    TRANSURETHRAL RESECTION OF PROSTATE N/A 03/14/2019   Procedure: TRANSURETHRAL RESECTION OF THE PROSTATE (TURP);  Surgeon: Watt Rush, MD;  Location: [facility];  Service: Urology;  Laterality: N/A;    Family History  Problem Relation Age of Onset   Diabetes Father     Social History:  reports that he has quit smoking. He has quit using smokeless tobacco.  His smokeless tobacco use included chew. He reports that he does not drink alcohol and does not use drugs.  Review of Systems: Same as HPI   BP Readings from Last 3 Encounters:  03/13/24 (!) 140/80  01/17/24 (!) 137/95  01/04/23 130/60     Lab Results  Component Value Date   CHOL 133 01/16/2024   CHOL 79 01/07/2021   Lab Results  Component Value Date   HDL 56 01/16/2024   HDL 25 (L) 01/07/2021   Lab Results  Component Value Date   LDLCALC 66 01/16/2024   LDLCALC 25 01/07/2021   Lab Results  Component Value Date   TRIG 54 01/16/2024   TRIG 143 01/07/2021   Lab Results  Component Value Date   CHOLHDL 2.4 01/16/2024   CHOLHDL 3.2 01/07/2021   No results found for: LDLDIRECT   Examination:   BP (!) 140/80   Pulse 84   Ht 5' 9 (1.753 m)   Wt 175 lb (79.4 kg)   SpO2 96%   BMI 25.84 kg/m   Body mass index is 25.84 kg/m.  Diabetic Foot Exam - Simple   Simple Foot  Form Diabetic Foot exam was performed with the following findings: Yes 03/13/2024  2:11 PM  Visual Inspection No deformities, no ulcerations, no other skin breakdown bilaterally: Yes Sensation Testing Pulse Check Posterior Tibialis and Dorsalis pulse intact bilaterally: Yes Comments Monofilament 1/3 R, 2/3 L      ASSESSMENT/ PLAN:  Diagnoses and all orders for this visit:  Uncontrolled type 2 diabetes mellitus with hypoglycemia without coma (HCC) -     Microalbumin / creatinine urine ratio  Long term (current) use of oral hypoglycemic drugs  Long-term (current) use of injectable non-insulin  antidiabetic drugs  Pure hypercholesterolemia  Other orders -     Semaglutide , 1 MG/DOSE, 4 MG/3ML SOPN; Inject 1 mg as directed once a week.   Diabetes type 2 complicated by neuropathy, MI s/p PCI History of gastric bypass and current poor appetite  Current regimen: Farxiga  10 mg qd, Ozempic  2 mg weekly and metformin  XR 500 mg 4 pills at bedtime  Recommend: Farxiga  10 mg qd, Ozempic  1 mg weekly and metformin  XR 500 mg 4 pills at bedtime  No S/E with any medications, well tolerated  Lab Results  Component Value Date   HGBA1C 5.4 01/16/2024   HGBA1C 5.9 (A) 01/04/2023   HGBA1C 6.2 01/04/2023   Symptoms of neuropathy: Tingling and numbness Has only some relief of pain with gabapentin  but also taking Cymbalta  Followed by neurologist  Hypertension:   Treatment includes valsartan  160 mg daily  Lipids: On rosuvasttain 20mg  every day 01/16/24 LDL at goal   Return in about 2 months (around 05/13/2024) for visit, labs today.  Patient Instructions   Goals of DM therapy:  Morning Fasting blood  sugar: 80-140  Blood sugar before meals: 80-140 Bed time blood sugar: 100-150  A1C <7%, limited only by hypoglycemia  1.Diabetes medications and their side effects discussed, including hypoglycemia    2. Check blood glucose:  a) Always check blood sugars before driving. Please see below  (under hypoglycemia) on how to manage b) Check a minimum of 3 times/day or more as needed when having symptoms of hypoglycemia.   c) Try to check blood glucose before sleeping/in the middle of the night to ensure that it is remaining stable and not dropping less than 100 d) Check blood glucose more often if sick  3. Diet: a) 3 meals per day schedule b: Restrict carbs to 60-70 grams (4 servings) per meal c) Colorful vegetables - 3 servings a day, and low sugar fruit 2 servings/day Plate control method: 1/4 plate protein, 1/4 starch, 1/2 green, yellow, or red vegetables d) Avoid carbohydrate snacks unless hypoglycemic episode, or increased physical activity  4. Regular exercise as tolerated, preferably 3 or more hours a week  5. Hypoglycemia: a)  Do not drive or operate machinery without first testing blood glucose to assure it is over 90 mg%, or if dizzy, lightheaded, not feeling normal, etc, or  if foot or leg is numb or weak. b)  If blood glucose less than 70, take four 5gm Glucose tabs or 15-30 gm Glucose gel.  Repeat every 15 min as needed until blood sugar is >100 mg/dl. If hypoglycemia persists then call 911.   6. Sick day management: a) Check blood glucose more often b) Continue usual therapy if blood sugars are elevated.   7. Contact the doctor immediately if blood glucose is frequently <60 mg/dl, or an episode of severe hypoglycemia occurs (where someone had to give you glucose/  glucagon or if you passed out from a low blood glucose), or if blood glucose is persistently >350 mg/dl, for further management  8. A change in level of physical activity or exercise and a change in diet may also affect your blood sugar. Check blood sugars more often and call if needed.  Instructions: 1. Bring glucose meter, blood glucose records on every visit for review 2. Continue to follow up with primary care physician and other providers for medical care 3. Yearly eye  and foot exam 4. Please  get blood work done prior to the next appointment    Kastiel Simonian 03/13/2024, 2:20 PM

## 2024-03-13 NOTE — Patient Instructions (Signed)

## 2024-03-14 ENCOUNTER — Encounter: Payer: Self-pay | Admitting: "Endocrinology

## 2024-03-14 LAB — MICROALBUMIN / CREATININE URINE RATIO
Creatinine, Urine: 95 mg/dL (ref 20–320)
Microalb Creat Ratio: 79 mg/g{creat} — ABNORMAL HIGH (ref <30)
Microalb, Ur: 7.5 mg/dL

## 2024-03-23 ENCOUNTER — Other Ambulatory Visit: Payer: Self-pay | Admitting: "Endocrinology

## 2024-05-17 ENCOUNTER — Ambulatory Visit: Admitting: "Endocrinology

## 2024-05-17 ENCOUNTER — Encounter: Payer: Self-pay | Admitting: "Endocrinology

## 2024-05-17 VITALS — BP 106/60 | HR 96 | Ht 69.0 in | Wt 169.0 lb

## 2024-05-17 DIAGNOSIS — Z7985 Long-term (current) use of injectable non-insulin antidiabetic drugs: Secondary | ICD-10-CM

## 2024-05-17 DIAGNOSIS — Z7984 Long term (current) use of oral hypoglycemic drugs: Secondary | ICD-10-CM

## 2024-05-17 DIAGNOSIS — E7800 Pure hypercholesterolemia, unspecified: Secondary | ICD-10-CM

## 2024-05-17 DIAGNOSIS — E11649 Type 2 diabetes mellitus with hypoglycemia without coma: Secondary | ICD-10-CM

## 2024-05-17 LAB — POCT GLYCOSYLATED HEMOGLOBIN (HGB A1C): Hemoglobin A1C: 5.6 % (ref 4.0–5.6)

## 2024-05-17 MED ORDER — FREESTYLE LIBRE 3 PLUS SENSOR MISC: 3 refills | Status: AC

## 2024-05-17 NOTE — Progress Notes (Signed)
 Patient ID: Grant Williams., male   DOB: [birth year], 68 y.o.   MRN: [id number]     Reason for Appointment: Type II Diabetes follow-up   History of Present Illness   Diagnosis date:  2008   Previous history: He was apparently taking metformin  2000 mg, Amaryl  for a few years before being referred here He was started on insulin  when his A1c was 11% compared to 9% prior to that His PCP also tried him on Farxiga  but this caused very frequent urination and he could not tolerate the 5 mg dose  He took insulin  2015-2018; he had gastric bypass surgery in 2018.   Oral hypoglycemic drugs previously used are: Invokana , Farxiga , metformin , Amaryl , Levemir  and Humalog  insulin  Insulin  was used only in the hospital in 1/23  Recent history: Non-insulin  hypoglycemic drugs: Farxiga  10 mg, metformin  ER 2000 mg and Ozempic  1 mg weekly    Side effects from medications: None  CGM interpretation: no data available, per recall, patient rarely gets <70 due to forgetting to eat and being busy in work, no BG reported >200  Weight control:  Wt Readings from Last 3 Encounters:  05/17/24 169 lb (76.7 kg)  03/13/24 175 lb (79.4 kg)  01/15/24 180 lb (81.6 kg)         Diabetes labs:  Lab Results  Component Value Date   HGBA1C 5.6 05/17/2024   HGBA1C 5.4 01/16/2024   HGBA1C 5.9 (A) 01/04/2023   Lab Results  Component Value Date   MICROALBUR 7.5 03/13/2024   LDLCALC 66 01/16/2024   CREATININE 0.86 01/17/2024    Allergies as of 05/17/2024       Reactions   Nsaids Other (See Comments)   Gastric bypass    Other Other (See Comments)   Steroids   Does not tolerate bad for stomach has to take med before and after if uses any steroid   Penicillins Other (See Comments)   Unknown childhood allergic reaction - no other information available        Medication List        Accurate as of May 17, 2024  8:28 AM. If you have any questions, ask your nurse or doctor.          acetaminophen   325 MG tablet Commonly known as: TYLENOL  Take 2 tablets (650 mg total) by mouth every 6 (six) hours as needed for mild pain or headache (fever >/= 101).   albuterol  108 (90 Base) MCG/ACT inhaler Commonly known as: VENTOLIN  HFA Inhale 2 puffs into the lungs every 6 (six) hours as needed for wheezing or shortness of breath.   amLODipine  10 MG tablet Commonly known as: NORVASC  Take 1 tablet (10 mg total) by mouth daily. Please contact the office to schedule appointment. 2ed attempt.   aspirin  EC 81 MG tablet Take 1 tablet (81 mg total) by mouth daily. Swallow whole.   CALCIUM  CITRATE-VITAMIN D  PO Take 1 tablet by mouth 2 (two) times daily. 600 + vit d3   carvedilol  6.25 MG tablet Commonly known as: COREG  Take 1 tablet (6.25 mg total) by mouth 2 (two) times daily.   dapagliflozin  propanediol 10 MG Tabs tablet Commonly known as: Farxiga  Take 1 tablet (10 mg total) by mouth daily.   DULoxetine  60 MG capsule Commonly known as: CYMBALTA  Take 60 mg by mouth daily.   ergocalciferol  1.25 MG (50000 UT) capsule Commonly known as: VITAMIN D2 Take 50,000 Units by mouth 3 (three) times a week. Mon / wed /and Fri  ferrous gluconate 324 MG tablet Commonly known as: FERGON Take 324 mg by mouth daily with breakfast.   FreeStyle Libre 3 Plus Sensor Misc Change sensor every 15 days. What changed: Another medication with the same name was added. Make sure you understand how and when to take each. Changed by: Kieanna Rollo   FreeStyle Libre 3 Plus Sensor Misc Change sensor every 15 days. What changed: You were already taking a medication with the same name, and this prescription was added. Make sure you understand how and when to take each. Changed by: Darrell Leonhardt   gabapentin  800 MG tablet Commonly known as: NEURONTIN  Take 800 mg by mouth 4 (four) times daily.   ipratropium 17 MCG/ACT inhaler Commonly known as: ATROVENT  HFA Inhale 2 puffs into the lungs 2 (two) times daily. What  changed:  when to take this reasons to take this   metFORMIN  500 MG 24 hr tablet Commonly known as: GLUCOPHAGE -XR TAKE 4 TABLETS BY MOUTH EVERY DAY   nitroGLYCERIN  0.4 MG SL tablet Commonly known as: NITROSTAT  Place 0.4 mg under the tongue every 5 (five) minutes as needed for chest pain.   nortriptyline  10 MG capsule Commonly known as: PAMELOR  Take 3 capsules by mouth at bedtime.   omeprazole 20 MG capsule Commonly known as: PRILOSEC Take 20 mg by mouth every morning.   rOPINIRole  1 MG tablet Commonly known as: REQUIP  Take 2 mg by mouth at bedtime.   rosuvastatin  20 MG tablet Commonly known as: CRESTOR  Take 1 tablet (20 mg total) by mouth daily.   Semaglutide  (1 MG/DOSE) 4 MG/3ML Sopn Inject 1 mg as directed once a week.   ticagrelor  90 MG Tabs tablet Commonly known as: BRILINTA  Take 1 tablet (90 mg total) by mouth 2 (two) times daily.   traZODone  50 MG tablet Commonly known as: DESYREL  Take 100 mg by mouth at bedtime as needed for sleep.   valsartan  160 MG tablet Commonly known as: Diovan  Take 1 tablet (160 mg total) by mouth daily.        Allergies:  Allergies  Allergen Reactions   Nsaids Other (See Comments)    Gastric bypass    Other Other (See Comments)    Steroids   Does not tolerate bad for stomach has to take med before and after if uses any steroid    Penicillins Other (See Comments)    Unknown childhood allergic reaction - no other information available    Past Medical History:  Diagnosis Date   Anemia    iron infusions   Coronary artery disease    Cough syncope    Diabetes mellitus without complication (HCC)    Fibromyalgia    GERD (gastroesophageal reflux disease)    History of kidney stones    HTN (hypertension)    Hyperlipidemia    Hypertension    Morbid obesity (HCC)    Pneumonia    Sleep apnea    dont tolerate cpap   SOB (shortness of breath)    Uncontrolled type 2 diabetes mellitus with complication    Vitamin B12  deficiency     Past Surgical History:  Procedure Laterality Date   CORONARY/GRAFT ACUTE MI REVASCULARIZATION N/A 01/15/2024   Procedure: Coronary/Graft Acute MI Revascularization;  Surgeon: Thukkani, Arun K, MD;  Location: [facility];  Service: Cardiovascular;  Laterality: N/A;   ELBOW SURGERY Right    HERNIA REPAIR     left and right inguinal , umbilical also   LEFT HEART CATH AND CORONARY ANGIOGRAPHY  N/A 01/15/2024   Procedure: LEFT HEART CATH AND CORONARY ANGIOGRAPHY;  Surgeon: Wendel Lurena POUR, MD;  Location: [facility];  Service: Cardiovascular;  Laterality: N/A;   ROUX-EN-Y GASTRIC BYPASS     08-2016  something nicked and pt. had to have 3 units of blood   SHOULDER ARTHROSCOPY Right    TRANSURETHRAL RESECTION OF PROSTATE N/A 03/14/2019   Procedure: TRANSURETHRAL RESECTION OF THE PROSTATE (TURP);  Surgeon: Watt Rush, MD;  Location: [facility];  Service: Urology;  Laterality: N/A;    Family History  Problem Relation Age of Onset   Diabetes Father     Social History:  reports that he has quit smoking. He has quit using smokeless tobacco.  His smokeless tobacco use included chew. He reports that he does not drink alcohol and does not use drugs.  Review of Systems: Same as HPI   BP Readings from Last 3 Encounters:  05/17/24 106/60  03/13/24 (!) 140/80  01/17/24 (!) 137/95     Lab Results  Component Value Date   CHOL 133 01/16/2024   CHOL 79 01/07/2021   Lab Results  Component Value Date   HDL 56 01/16/2024   HDL 25 (L) 01/07/2021   Lab Results  Component Value Date   LDLCALC 66 01/16/2024   LDLCALC 25 01/07/2021   Lab Results  Component Value Date   TRIG 54 01/16/2024   TRIG 143 01/07/2021   Lab Results  Component Value Date   CHOLHDL 2.4 01/16/2024   CHOLHDL 3.2 01/07/2021   No results found for: LDLDIRECT   Examination:   BP 106/60   Pulse 96   Ht 5' 9 (1.753 m)   Wt 169 lb (76.7 kg)   SpO2 99%   BMI 24.96 kg/m   Body mass index  is 24.96 kg/m.  Diabetic Foot Exam - Simple   No data filed      ASSESSMENT/ PLAN:  Diagnoses and all orders for this visit:  Uncontrolled type 2 diabetes mellitus with hypoglycemia without coma (HCC) -     POCT glycosylated hemoglobin (Hb A1C)  Long term (current) use of oral hypoglycemic drugs  Long-term (current) use of injectable non-insulin  antidiabetic drugs  Pure hypercholesterolemia  Other orders -     Continuous Glucose Sensor (FREESTYLE LIBRE 3 PLUS SENSOR) MISC; Change sensor every 15 days.   Diabetes type 2 complicated by neuropathy, MI s/p PCI History of gastric bypass  Current regimen: Farxiga  10 mg qd, Ozempic  1 mg weekly and metformin  XR 500 mg 4 pills at bedtime  Recommend: Farxiga  10 mg qd, Ozempic  1 mg weekly and metformin  XR 500 mg 4 pills at bedtime (stopped ozempic  2 mg and feels better with appetite) 05/17/24: Stop ozempic  to improve lows related to low appetite  No S/E with any medications, well tolerated  Lab Results  Component Value Date   HGBA1C 5.6 05/17/2024   HGBA1C 5.4 01/16/2024   HGBA1C 5.9 (A) 01/04/2023   Symptoms of neuropathy: Tingling and numbness Has only some relief of pain with gabapentin  but also taking Cymbalta  Followed by neurologist  Hypertension:   Treatment includes valsartan  160 mg daily  Lipids: On rosuvasttain 20mg  every day 01/16/24 LDL at goal   Return in about 3 months (around 08/17/2024).  Patient Instructions   Goals of DM therapy:  Morning Fasting blood sugar: 80-140  Blood sugar before meals: 80-140 Bed time blood sugar: 100-150  A1C <7%, limited only by hypoglycemia  1.Diabetes medications and their side effects discussed,  including hypoglycemia    2. Check blood glucose:  a) Always check blood sugars before driving. Please see below (under hypoglycemia) on how to manage b) Check a minimum of 3 times/day or more as needed when having symptoms of hypoglycemia.   c) Try to check blood glucose before  sleeping/in the middle of the night to ensure that it is remaining stable and not dropping less than 100 d) Check blood glucose more often if sick  3. Diet: a) 3 meals per day schedule b: Restrict carbs to 60-70 grams (4 servings) per meal c) Colorful vegetables - 3 servings a day, and low sugar fruit 2 servings/day Plate control method: 1/4 plate protein, 1/4 starch, 1/2 green, yellow, or red vegetables d) Avoid carbohydrate snacks unless hypoglycemic episode, or increased physical activity  4. Regular exercise as tolerated, preferably 3 or more hours a week  5. Hypoglycemia: a)  Do not drive or operate machinery without first testing blood glucose to assure it is over 90 mg%, or if dizzy, lightheaded, not feeling normal, etc, or  if foot or leg is numb or weak. b)  If blood glucose less than 70, take four 5gm Glucose tabs or 15-30 gm Glucose gel.  Repeat every 15 min as needed until blood sugar is >100 mg/dl. If hypoglycemia persists then call 911.   6. Sick day management: a) Check blood glucose more often b) Continue usual therapy if blood sugars are elevated.   7. Contact the doctor immediately if blood glucose is frequently <60 mg/dl, or an episode of severe hypoglycemia occurs (where someone had to give you glucose/  glucagon or if you passed out from a low blood glucose), or if blood glucose is persistently >350 mg/dl, for further management  8. A change in level of physical activity or exercise and a change in diet may also affect your blood sugar. Check blood sugars more often and call if needed.  Instructions: 1. Bring glucose meter, blood glucose records on every visit for review 2. Continue to follow up with primary care physician and other providers for medical care 3. Yearly eye  and foot exam 4. Please get blood work done prior to the next appointment    [facility] 05/17/2024, 8:28 AM

## 2024-05-17 NOTE — Patient Instructions (Signed)

## 2024-07-19 ENCOUNTER — Other Ambulatory Visit: Payer: Self-pay | Admitting: "Endocrinology

## 2024-07-19 DIAGNOSIS — E1140 Type 2 diabetes mellitus with diabetic neuropathy, unspecified: Secondary | ICD-10-CM

## 2024-08-17 ENCOUNTER — Ambulatory Visit: Admitting: "Endocrinology
